# Patient Record
Sex: Female | Born: 1955 | Race: White | Hispanic: No | Smoking: Current every day smoker
Health system: Southern US, Community
[De-identification: ages and names within clinical notes are randomized; demographics above are authoritative.]

## PROBLEM LIST (undated history)

## (undated) DIAGNOSIS — E039 Hypothyroidism, unspecified: Secondary | ICD-10-CM

## (undated) DIAGNOSIS — R0600 Dyspnea, unspecified: Secondary | ICD-10-CM

## (undated) DIAGNOSIS — C801 Malignant (primary) neoplasm, unspecified: Secondary | ICD-10-CM

## (undated) DIAGNOSIS — F419 Anxiety disorder, unspecified: Secondary | ICD-10-CM

## (undated) DIAGNOSIS — I6529 Occlusion and stenosis of unspecified carotid artery: Secondary | ICD-10-CM

## (undated) DIAGNOSIS — R600 Localized edema: Secondary | ICD-10-CM

## (undated) DIAGNOSIS — I10 Essential (primary) hypertension: Secondary | ICD-10-CM

## (undated) DIAGNOSIS — J449 Chronic obstructive pulmonary disease, unspecified: Secondary | ICD-10-CM

## (undated) DIAGNOSIS — R519 Headache, unspecified: Secondary | ICD-10-CM

## (undated) DIAGNOSIS — K219 Gastro-esophageal reflux disease without esophagitis: Secondary | ICD-10-CM

## (undated) DIAGNOSIS — R51 Headache: Secondary | ICD-10-CM

## (undated) DIAGNOSIS — R011 Cardiac murmur, unspecified: Secondary | ICD-10-CM

## (undated) DIAGNOSIS — D649 Anemia, unspecified: Secondary | ICD-10-CM

## (undated) DIAGNOSIS — E079 Disorder of thyroid, unspecified: Secondary | ICD-10-CM

## (undated) DIAGNOSIS — M1990 Unspecified osteoarthritis, unspecified site: Secondary | ICD-10-CM

## (undated) DIAGNOSIS — F329 Major depressive disorder, single episode, unspecified: Secondary | ICD-10-CM

## (undated) DIAGNOSIS — I2510 Atherosclerotic heart disease of native coronary artery without angina pectoris: Secondary | ICD-10-CM

## (undated) DIAGNOSIS — Z972 Presence of dental prosthetic device (complete) (partial): Secondary | ICD-10-CM

## (undated) DIAGNOSIS — F32A Depression, unspecified: Secondary | ICD-10-CM

## (undated) HISTORY — PX: EYE SURGERY: SHX253

## (undated) HISTORY — PX: CARDIAC CATHETERIZATION: SHX172

## (undated) HISTORY — PX: JOINT REPLACEMENT: SHX530

## (undated) HISTORY — PX: TOTAL THYROIDECTOMY: SHX2547

## (undated) HISTORY — PX: TUBAL LIGATION: SHX77

---
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
## 2007-01-10 ENCOUNTER — Emergency Department: Payer: Self-pay | Admitting: Emergency Medicine

## 2007-03-29 ENCOUNTER — Emergency Department: Payer: Self-pay | Admitting: Emergency Medicine

## 2008-02-10 ENCOUNTER — Other Ambulatory Visit: Payer: Self-pay

## 2008-02-10 ENCOUNTER — Observation Stay: Payer: Self-pay | Admitting: Internal Medicine

## 2008-08-27 ENCOUNTER — Emergency Department: Payer: Self-pay | Admitting: Emergency Medicine

## 2008-09-01 ENCOUNTER — Emergency Department: Payer: Self-pay | Admitting: Emergency Medicine

## 2008-11-19 ENCOUNTER — Inpatient Hospital Stay: Payer: Self-pay | Admitting: Internal Medicine

## 2009-07-27 ENCOUNTER — Inpatient Hospital Stay: Payer: Self-pay | Admitting: Internal Medicine

## 2009-11-02 DIAGNOSIS — G629 Polyneuropathy, unspecified: Secondary | ICD-10-CM | POA: Insufficient documentation

## 2009-11-30 DIAGNOSIS — E538 Deficiency of other specified B group vitamins: Secondary | ICD-10-CM | POA: Insufficient documentation

## 2009-11-30 DIAGNOSIS — E209 Hypoparathyroidism, unspecified: Secondary | ICD-10-CM | POA: Insufficient documentation

## 2009-12-17 DIAGNOSIS — G4700 Insomnia, unspecified: Secondary | ICD-10-CM | POA: Insufficient documentation

## 2010-06-28 DIAGNOSIS — S20219A Contusion of unspecified front wall of thorax, initial encounter: Secondary | ICD-10-CM | POA: Insufficient documentation

## 2010-07-21 ENCOUNTER — Ambulatory Visit: Payer: Self-pay | Admitting: Family Medicine

## 2010-10-04 DIAGNOSIS — M549 Dorsalgia, unspecified: Secondary | ICD-10-CM | POA: Insufficient documentation

## 2010-11-18 ENCOUNTER — Ambulatory Visit: Payer: Self-pay

## 2011-01-25 ENCOUNTER — Encounter: Payer: Self-pay | Admitting: Physician Assistant

## 2011-01-30 ENCOUNTER — Encounter: Payer: Self-pay | Admitting: Physician Assistant

## 2011-02-15 ENCOUNTER — Ambulatory Visit: Payer: Self-pay | Admitting: Internal Medicine

## 2011-03-01 ENCOUNTER — Encounter: Payer: Self-pay | Admitting: Physician Assistant

## 2011-07-19 ENCOUNTER — Ambulatory Visit: Payer: Self-pay | Admitting: Gastroenterology

## 2011-10-12 ENCOUNTER — Ambulatory Visit: Payer: Self-pay | Admitting: Gastroenterology

## 2012-01-18 ENCOUNTER — Ambulatory Visit: Payer: Self-pay | Admitting: Family Medicine

## 2012-02-24 DIAGNOSIS — R42 Dizziness and giddiness: Secondary | ICD-10-CM | POA: Insufficient documentation

## 2012-06-08 DIAGNOSIS — N3941 Urge incontinence: Secondary | ICD-10-CM | POA: Insufficient documentation

## 2012-07-30 ENCOUNTER — Other Ambulatory Visit: Payer: Self-pay | Admitting: Gastroenterology

## 2012-07-30 DIAGNOSIS — K5730 Diverticulosis of large intestine without perforation or abscess without bleeding: Secondary | ICD-10-CM

## 2012-08-08 ENCOUNTER — Ambulatory Visit
Admission: RE | Admit: 2012-08-08 | Discharge: 2012-08-08 | Disposition: A | Discharge status: Home / Self Care | Payer: PRIVATE HEALTH INSURANCE | Source: Ambulatory Visit | Attending: Gastroenterology | Admitting: Gastroenterology

## 2012-08-08 DIAGNOSIS — K5730 Diverticulosis of large intestine without perforation or abscess without bleeding: Secondary | ICD-10-CM

## 2012-08-13 ENCOUNTER — Emergency Department: Payer: Self-pay | Admitting: Emergency Medicine

## 2012-08-15 ENCOUNTER — Emergency Department: Payer: Self-pay | Admitting: Emergency Medicine

## 2012-12-12 ENCOUNTER — Ambulatory Visit: Payer: Self-pay | Admitting: Internal Medicine

## 2013-04-19 DIAGNOSIS — M47816 Spondylosis without myelopathy or radiculopathy, lumbar region: Secondary | ICD-10-CM | POA: Insufficient documentation

## 2013-04-19 DIAGNOSIS — G8929 Other chronic pain: Secondary | ICD-10-CM | POA: Insufficient documentation

## 2013-06-13 ENCOUNTER — Ambulatory Visit: Payer: Self-pay | Admitting: Family Medicine

## 2013-09-22 ENCOUNTER — Inpatient Hospital Stay: Payer: Self-pay | Admitting: Psychiatry

## 2013-09-22 LAB — URINALYSIS, COMPLETE
Bilirubin,UR: NEGATIVE
Blood: NEGATIVE
Ketone: NEGATIVE
Nitrite: NEGATIVE
Protein: NEGATIVE
Specific Gravity: 1.018 (ref 1.003–1.030)
Squamous Epithelial: 2
WBC UR: 2 /HPF (ref 0–5)

## 2013-09-22 LAB — COMPREHENSIVE METABOLIC PANEL
Albumin: 3.5 g/dL (ref 3.4–5.0)
Bilirubin,Total: 0.2 mg/dL (ref 0.2–1.0)
EGFR (African American): >60
Glucose: 86 mg/dL (ref 65–99)
Osmolality: 276 (ref 275–301)
SGOT(AST): 35 U/L (ref 15–37)
SGPT (ALT): 24 U/L (ref 12–78)
Sodium: 139 mmol/L (ref 136–145)
Total Protein: 7.5 g/dL (ref 6.4–8.2)

## 2013-09-22 LAB — DRUG SCREEN, URINE
Amphetamines, Ur Screen: NEGATIVE (ref ?–1000)
Barbiturates, Ur Screen: NEGATIVE (ref ?–200)
Benzodiazepine, Ur Scrn: POSITIVE (ref ?–200)
MDMA (Ecstasy)Ur Screen: NEGATIVE (ref ?–500)
Phencyclidine (PCP) Ur S: NEGATIVE (ref ?–25)
Tricyclic, Ur Screen: NEGATIVE (ref ?–1000)

## 2013-09-22 LAB — CBC
HCT: 36.8 % (ref 35.0–47.0)
MCV: 94 fL (ref 80–100)
Platelet: 237 10*3/uL (ref 150–440)
WBC: 9.9 10*3/uL (ref 3.6–11.0)

## 2013-09-22 LAB — ETHANOL: Ethanol: <3 mg/dL

## 2014-03-04 DIAGNOSIS — K219 Gastro-esophageal reflux disease without esophagitis: Secondary | ICD-10-CM | POA: Insufficient documentation

## 2014-03-21 DIAGNOSIS — J209 Acute bronchitis, unspecified: Secondary | ICD-10-CM | POA: Insufficient documentation

## 2014-05-06 DIAGNOSIS — J449 Chronic obstructive pulmonary disease, unspecified: Secondary | ICD-10-CM | POA: Insufficient documentation

## 2014-05-06 DIAGNOSIS — E782 Mixed hyperlipidemia: Secondary | ICD-10-CM | POA: Insufficient documentation

## 2014-10-06 ENCOUNTER — Inpatient Hospital Stay: Payer: Self-pay | Admitting: Internal Medicine

## 2014-10-06 LAB — TROPONIN I

## 2014-10-06 LAB — CBC
HCT: 36.7 % (ref 35.0–47.0)
HGB: 11.7 g/dL — ABNORMAL LOW (ref 12.0–16.0)
MCH: 30.9 pg (ref 26.0–34.0)
MCHC: 31.8 g/dL — ABNORMAL LOW (ref 32.0–36.0)
MCV: 97 fL (ref 80–100)
Platelet: 250 10*3/uL (ref 150–440)
RBC: 3.78 10*6/uL — ABNORMAL LOW (ref 3.80–5.20)
RDW: 15.9 % — ABNORMAL HIGH (ref 11.5–14.5)
WBC: 11.4 10*3/uL — AB (ref 3.6–11.0)

## 2014-10-06 LAB — BASIC METABOLIC PANEL
Anion Gap: 9 (ref 7–16)
BUN: 5 mg/dL — AB (ref 7–18)
CALCIUM: 6.3 mg/dL — AB (ref 8.5–10.1)
CHLORIDE: 106 mmol/L (ref 98–107)
Co2: 27 mmol/L (ref 21–32)
Creatinine: 0.72 mg/dL (ref 0.60–1.30)
GLUCOSE: 81 mg/dL (ref 65–99)
OSMOLALITY: 279 (ref 275–301)
Potassium: 3.7 mmol/L (ref 3.5–5.1)
Sodium: 142 mmol/L (ref 136–145)

## 2014-10-06 LAB — CALCIUM: CALCIUM: 6.3 mg/dL — AB (ref 8.5–10.1)

## 2014-10-06 LAB — MAGNESIUM: MAGNESIUM: 2 mg/dL

## 2014-10-06 LAB — ALBUMIN: ALBUMIN: 3.2 g/dL — AB (ref 3.4–5.0)

## 2014-10-06 LAB — TSH: Thyroid Stimulating Horm: 41.9 u[IU]/mL — ABNORMAL HIGH

## 2014-10-07 LAB — CBC WITH DIFFERENTIAL/PLATELET
BASOS ABS: 0 10*3/uL (ref 0.0–0.1)
Basophil %: 0.3 %
EOS PCT: 0.1 %
Eosinophil #: 0 10*3/uL (ref 0.0–0.7)
HCT: 37 % (ref 35.0–47.0)
HGB: 11.8 g/dL — ABNORMAL LOW (ref 12.0–16.0)
Lymphocyte #: 1.5 10*3/uL (ref 1.0–3.6)
Lymphocyte %: 13.2 %
MCH: 30.9 pg (ref 26.0–34.0)
MCHC: 31.8 g/dL — AB (ref 32.0–36.0)
MCV: 97 fL (ref 80–100)
Monocyte #: 0.5 x10 3/mm (ref 0.2–0.9)
Monocyte %: 4.4 %
NEUTROS ABS: 9.5 10*3/uL — AB (ref 1.4–6.5)
NEUTROS PCT: 82 %
PLATELETS: 269 10*3/uL (ref 150–440)
RBC: 3.8 10*6/uL (ref 3.80–5.20)
RDW: 15 % — AB (ref 11.5–14.5)
WBC: 11.5 10*3/uL — AB (ref 3.6–11.0)

## 2014-10-07 LAB — BASIC METABOLIC PANEL
Anion Gap: 5 — ABNORMAL LOW (ref 7–16)
BUN: 8 mg/dL (ref 7–18)
CALCIUM: 6.8 mg/dL — AB (ref 8.5–10.1)
CO2: 31 mmol/L (ref 21–32)
Chloride: 102 mmol/L (ref 98–107)
Creatinine: 0.9 mg/dL (ref 0.60–1.30)
EGFR (African American): >60
EGFR (Non-African Amer.): >60
GLUCOSE: 140 mg/dL — AB (ref 65–99)
Osmolality: 276 (ref 275–301)
Potassium: 3.6 mmol/L (ref 3.5–5.1)
SODIUM: 138 mmol/L (ref 136–145)

## 2014-10-07 LAB — CALCIUM: Calcium, Total: 7 mg/dL — CL (ref 8.5–10.1)

## 2014-10-08 LAB — BASIC METABOLIC PANEL
ANION GAP: 5 — AB (ref 7–16)
BUN: 10 mg/dL (ref 7–18)
CO2: 34 mmol/L — AB (ref 21–32)
CREATININE: 1.01 mg/dL (ref 0.60–1.30)
Calcium, Total: 6.5 mg/dL — CL (ref 8.5–10.1)
Chloride: 101 mmol/L (ref 98–107)
EGFR (African American): >60
GFR CALC NON AF AMER: 60 — AB
Glucose: 115 mg/dL — ABNORMAL HIGH (ref 65–99)
OSMOLALITY: 279 (ref 275–301)
POTASSIUM: 3.5 mmol/L (ref 3.5–5.1)
Sodium: 140 mmol/L (ref 136–145)

## 2014-10-17 DIAGNOSIS — F3341 Major depressive disorder, recurrent, in partial remission: Secondary | ICD-10-CM | POA: Insufficient documentation

## 2015-02-02 ENCOUNTER — Ambulatory Visit
Admit: 2015-02-02 | Disposition: A | Discharge status: Home / Self Care | Payer: Self-pay | Attending: Family Medicine | Admitting: Family Medicine

## 2015-02-20 NOTE — H&P (Signed)
PATIENT NAME:  Maria Warren, Maria Warren MR#:  [id number] DATE OF BIRTH:  [birth year]  DATE OF ADMISSION:  09/22/2013  IDENTIFYING INFORMATION AND CHIEF COMPLAINT: A 59 year old woman who came into the hospital saying that she was having suicidal ideation. Chief complaint: "I've just been feeling so bad."   HISTORY OF PRESENT ILLNESS: The patient states that her mood has been severely depressed for at least the last 2 weeks. She is sleeping poorly at night. She has negative thoughts all the time. Feels lonely and sad about herself. Has poor levels of optimism. She has suicidal thoughts which are vague, and it is unclear to me how serious she is about them. She told me at one point that she would only kill herself if she could do it without hurting herself and at another point said her only plan was to beat herself on the head with a hammer. She would not reconcile any of this for me. She says that she felt like when she started taking Topamax at the pain clinic that it made her depression worse. She has been off the Topamax for about a week and says the mood has been getting worse since then. She does not report auditory or visual hallucinations. Does not report alcohol or drug abuse. Says she has been compliant with her medication.   PAST PSYCHIATRIC HISTORY: Denies any previous psychiatric hospitalizations. Denies any previous suicide attempts. She has been getting psychiatric medication from her primary care doctor at the [facility]. She has a past history of medications using Zoloft and Wellbutrin which she says did not work. She is currently taking citalopram 20 mg twice a day and BuSpar 10 mg twice a day as far as I can tell. She says she thinks that they were working in the past. Denies any history of violence to others.   MEDICAL HISTORY: The patient has hypothyroidism. Appears to have pretty significant hypocalcemia. Also has chronic pain and chronic back pain and elevated cholesterol and gastric  reflux symptoms.   SOCIAL HISTORY: The patient is not married. She has an adult child who lives somewhere else and is not involved in her life. It sounds like she rents out part of her property to other people, and there has been some drama around that that has been stressing her out recently.   SUBSTANCE ABUSE HISTORY: Drinks about 3 beers a week she says. Also used marijuana last night. Denies that she uses it regularly.   TOBACCO HISTORY: Smokes a pack a day.   REVIEW OF SYSTEMS: Complains of depression, fatigue, poor appetite, impaired sleep. Suicidal ideation. Denies hallucinations. Chronic back pain, chronic gastric reflux symptoms.   MENTAL STATUS EXAM: Older woman who looks older than her stated age. Reasonably cooperative with the interview, although sometimes vague in answering questions and a little bit evasive, I think. Eye contact poor. Psychomotor activity limited. Speech decreased in total amount. Affect anxious and dysphoric. Mood stated as depressed. Thoughts are generally lucid. No obvious delusional thinking. Denies hallucinations. Positive suicidal ideation with unclear intent. No homicidal ideation. Probably average intelligence. Alert and oriented x 4. Judgment and insight a little bit impaired.   PHYSICAL EXAMINATION:  GENERAL: Older woman who is overweight.  SKIN: No acute skin lesions identified.  HEENT: Pupils equal and reactive. Face symmetric. Oral mucosa dry.  NECK AND BACK: Mild tenderness throughout to light touch.  NEUROLOGIC: Normal gait. Strength and reflexes symmetric and normal throughout. Full range of motion at extremities. Cranial nerves symmetric and  normal.  LUNGS: Clear without wheezes.  HEART: Regular rate and rhythm.  ABDOMEN: Soft, nontender. Normal bowel sounds.  VITAL SIGNS: Current temperature 98.2, pulse 86, respirations 18, blood pressure 138/75.   LABORATORY RESULTS: Chemistry panel all normal except for a low calcium at 8. Alcohol negative.  Drug screen positive for cannabis and benzodiazepines. CBC normal. Urinalysis 1+ bacteria but otherwise does not really look infected. Salicylates slightly elevated at 2.9. Acetaminophen negative.   ASSESSMENT: A 59 year old woman who gives a history of long-standing depression. She is giving a history of current suicidal ideation without specific intent but is not able to contract for safety. Needs hospitalization for stabilization.   TREATMENT PLAN: Admit to psychiatry. Continue current medications. Try and get collateral information. Include her in groups and activities and individual therapy on the unit. Consider possible changes to medication.   DIAGNOSIS, PRINCIPAL AND PRIMARY:  AXIS I: Major depression, severe, recurrent.   SECONDARY DIAGNOSES:  AXIS I: No further.  AXIS II: No diagnosis.  AXIS III: Hypocalcemia, hypothyroidism, dyslipidemia, gastric reflux, chronic pain.  AXIS IV: Moderate to severe chronically and acutely.  AXIS V: Functioning at time of evaluation is 34.   ____________________________ Gonzella Lex, MD jtc:gb D: 09/22/2013 16:44:16 ET T: 09/22/2013 22:05:12 ET JOB#: [id number]  cc: Gonzella Lex, MD, <Dictator> Gonzella Lex MD ELECTRONICALLY SIGNED 09/22/2013 22:37

## 2015-02-20 NOTE — Discharge Summary (Signed)
PATIENT NAME:  Maria Warren, Maria Warren MR#:  [id number] DATE OF BIRTH:  1956-01-05  DATE OF ADMISSION:  09/22/2013 DATE OF DISCHARGE:  09/24/2013  HOSPITAL COURSE: See dictated history and physical for details of admission. This 59 year old woman came in through the Emergency Room, stating she was severely depressed and having suicidal ideation. The patient was engaged in individual and group psychotherapy as well as medication management on the ward. Her pain was treated, as well as her chronic obstructive pulmonary disease. She was continued on her outpatient medicines for her medical problems. Her citalopram was prescribed at 40 mg a day, BuSpar 10 mg twice a day. After just a day in the hospital, the patient reported her mood was feeling much better. Her affect was brighter. She was calmer and smiling. Denied any suicidal ideation and said she felt like she had had time to recover and no longer felt overwhelmed by her outpatient stresses. She was agreeable to outpatient treatment in the community. Outpatient appointment was made with [facility] with Dr. Leim Fabry for her follow-up.   DISCHARGE MEDICATIONS: Trazodone 100 mg at bedtime, citalopram 40 mg daily, simvastatin 20 mg at night, hydrochlorothiazide/lisinopril combination 12.5/10 mg 1 tablet twice a day, BuSpar 10 mg twice a day, albuterol/ipratropium inhaler 1 puff 4 times a day as needed for shortness of breath, fluticasone 250 mcg inhalation powder 1 puff twice a day, Spiriva HandiHaler 18 mcg inhaled per day, MiraLax 17 grams and a day as needed for constipation, Premarin 0.625 mg once a day, levothyroxine 175 mcg per day, calcitriol 0.5 mg twice a day.   LABORATORY RESULTS: Admission labs included alcohol level negative. Chemistry panel with low calcium at 8.0. Drug screen positive for cannabis and benzodiazepines. Hematology panel normal. Urinalysis borderline, unlikely contaminated.   MENTAL STATUS EXAM AT DISCHARGE: Casually dressed, reasonably  well-groomed woman, looks her stated age, cooperative with the interview. Good eye contact. Normal psychomotor activity. Speech normal rate, tone and volume. Affect euthymic, reactive. Mood stated as good. Thoughts are lucid without loosening of associations or delusions. Denies auditory or visual hallucinations. Denies suicidal or homicidal ideation. Shows good insight and judgment. Normal intelligence. Alert and oriented x4.   DIAGNOSIS, PRINCIPAL AND PRIMARY:  AXIS I: Major depression, recurrent, moderate.   SECONDARY DIAGNOSES: AXIS I: No further.   AXIS II: Deferred.   AXIS III: Hypothyroidism, chronic obstructive pulmonary disease, chronic, low calcium, high blood pressure, dyslipidemia.   AXIS IV: Moderate from isolation.   AXIS V: Functioning at time of discharge 59.      ____________________________ Gonzella Lex, MD jtc:cg D: 09/24/2013 22:01:56 ET T: 09/24/2013 22:08:44 ET JOB#: [id number]  cc: Gonzella Lex, MD, <Dictator> Gonzella Lex MD ELECTRONICALLY SIGNED 09/24/2013 23:45

## 2015-02-21 NOTE — Consult Note (Signed)
PATIENT NAME:  Maria Warren, Maria Warren MR#:  [id number] DATE OF BIRTH:  05/14/1956  DATE OF CONSULTATION:  10/07/2014  CONSULTING PHYSICIAN:  Gonzella Lex, MD  IDENTIFYING INFORMATION AND REASON FOR CONSULTATION: A 59 year old woman with a history of a depression, currently in the hospital with symptomatic hypocalcemia.   CHIEF COMPLAINT: "I've been doing without my medicine."   HISTORY OF PRESENT ILLNESS: The patient states her nerves have been bad and her mood has been bad. She has been feeling anxious and jittery, and depressed. Mood stays sad at times and irritable and angry. She has been depressed and sleeping a lot. She feels like there has been a huge amount of drama around her at home. She has a bad habit of inviting people she barely knows to come and live with her and then they end up causing trouble. The police have had to be called recently. She is unable to financially take care of herself. She has been skipping her medicine for the last couple months because she could not afford it. She has been off of her thyroid medicine as well as the Celexa, BuSpar, and hydroxyzine, and her blood pressure medicine. Denies suicidal thoughts. Denies psychotic symptoms. Also has had very occasional panic attacks, most recently one that woke her up at night.   PAST PSYCHIATRIC HISTORY: The patient has had a previous hospitalization at our facility. Has had suicidal ideation in the past. Recently, no suicide attempts. She was getting her medicine prescribed by her primary care at [facility]. Has not seen an actual mental health provider in a long time.   SUBSTANCE ABUSE HISTORY: Says that she has been drinking beer more frequently recently, about 2 of them a night. She says there is a past history of alcohol abuse, but she disputes that. Does not abuse any other drugs.   PAST MEDICAL HISTORY: Hypothyroidism, high blood pressure, symptomatic hypocalcemia, overweight.   SOCIAL HISTORY: Lives with some  other people she barely knows that she has invited to come and stay in her house. Minimal social contact outside of that.   FAMILY HISTORY: Mother had bipolar disorder.   REVIEW OF SYSTEMS:  Sad mood, angry mood. Low energy. Increased sleep. Denies suicidal ideation. No hallucinations. Physically, she is still feeling like she has a tingling all over her body, but that is getting better.   MENTAL STATUS EXAMINATION: A somewhat disheveled woman who looks her stated age, cooperative with the interview. Eye contact diminished.  Psychomotor activity sluggish. Speech decreased in amount and rate. Affect is flat. Mood is stated as depressed. Thoughts are lucid but slow. No evidence of delusions. Denies auditory or visual hallucinations. Denies suicidal or homicidal ideation. She is alert and oriented x 4. She can remember 3/3 objects immediately, only 1 out of 3 at 3 minutes. Baseline intelligence average. Judgment and insight adequate.   LABORATORY RESULTS: Admission labs included TSH at 41.9, albumin low at 3.2, magnesium 2, calcium is very low down at 6.3.  The CBC, elevated white count, 11.4.   VITAL SIGNS: Most recent blood pressure 146/83, respirations 18, pulse 56, temperature 98.6.   ASSESSMENT: A 59 year old woman with a history of depression, has been off her medicine for a couple of months. Some of her symptoms could be from hypothyroidism. Also has a history of depression and anxiety. The patient is not reporting suicidal ideation or psychosis, does not require psychiatric hospitalization.   TREATMENT PLAN: Restart Celexa 40 mg daily, BuSpar 10 mg twice a  day, hydroxyzine 25 mg 4 times a day as needed for anxiety. The patient is strongly encouraged to consider followup with a mental health provider if her mood does not returned to normal, but to stay on her medicine. She has been educated about the utility of the medicine. She is also educated and encouraged to stay off of alcohol when she is  feeling bad like this. Will check in tomorrow if she is still here.   DIAGNOSIS, PRINCIPAL AND PRIMARY:  AXIS I: Major depression, recurrent, moderate.   SECONDARY DIAGNOSES: AXIS I: Generalized anxiety disorder.  AXIS II: Deferred.  AXIS III: Hypothyroid, hypocalcemia.     ____________________________ Gonzella Lex, MD jtc:LT D: 10/07/2014 16:24:31 ET T: 10/07/2014 16:39:45 ET JOB#: [id number]  cc: Gonzella Lex, MD, <Dictator> Gonzella Lex MD ELECTRONICALLY SIGNED 10/12/2014 11:15

## 2015-02-21 NOTE — Consult Note (Signed)
PATIENT NAME:  Maria Warren, Maria Warren MR#:  [id number] DATE OF BIRTH:  Nov 14, 1955  DATE OF CONSULTATION:  10/07/2014  REFERRING PHYSICIAN:  Bettey Costa, M.D.  CONSULTING PHYSICIAN:  A. Lavone Orn, MD  CHIEF COMPLAINT: Hypothyroidism and hypoparathyroidism.   HISTORY OF PRESENT ILLNESS: This is a 59 year old female seen in consultation for the above complaints. She underwent total thyroidectomy in 1657 which was complicated by postoperative hypoparathyroidism. She explains the need for thyroidectomy was prompted by a large symptomatic thyroid nodule causing compressive symptoms. She recalls that the thyroid was benign. She has been treated for hypoparathyroidism and hypothyroidism ever since. Upon presentation on yesterday, she had been without all medications for over a month. Initial serum calcium was low at 6.3, corrected for hypoalbuminemia to 6.9. She was having facial paresthesias and numbness. She was initiated on IV calcium, calcitriol, and Tums. Additionally, TSH was elevated at 41.9 consistent with uncontrolled postoperative hypothyroidism. She does report weakness. Reason for not taking medication was difficulty with finances as well as difficulty with transportation. With supplementation, calcium levels have gradually improved and most recent level around 2:00 p.m. today was corrected to 7.6 mg/dL. In reviewing her bag of medications at the bedside, she was previously prescribed levothyroxine 175 mcg and calcitriol 0.25 mcg 3 tablets twice daily. She was not prescribed, nor was she taking, any calcium supplementation.   PAST MEDICAL HISTORY:  1.  Postoperative hypothyroidism.  2.  Postoperative hypoparathyroidism.  3.  Hyperlipidemia.  4.  COPD.   5.  Hypertension.  6.  Anxiety.  7.  GERD.    PAST SURGICAL HISTORY:  1.  Total thyroidectomy.  2.  Cesarean section.   SOCIAL HISTORY: The patient is a smoker, smoking 1 pack per day. Denies alcohol use.   FAMILY HISTORY: No known endocrine  disorders.   ALLERGIES: WELLBUTRIN, TOPAMAX, LYRICA, AND ZOLOFT.   CURRENT MEDICATIONS:  1. Calcitriol 0.25 mcg once daily.  2. Tums 1000 mg q.i.d.  3. Atorvastatin 40 mg at bedtime.   4. BuSpar 10 mg b.i.d.  5. Vitamin D 1000 units daily.  6. Celexa 40 mg daily.  7. Levothyroxine 0.175 mg daily.  8. Pantoprazole 40 mg daily.  9. Aspirin 81 mg daily.  10. Advair 250/50 one puff b.i.d.  11. Spiriva 1 capsule once daily.    REVIEW OF SYSTEMS:   GENERAL: She reports weakness. She reports fatigue. She denies fevers.  HEENT: Denies blurred vision or sore throat.  NECK: Denies neck pain or dysphagia.  CARDIAC: Denies chest pain or palpitation.  PULMONARY: Denies cough or shortness of breath.  ABDOMEN: Denies nausea or abdominal pain. Reports fair appetite.  EXTREMITIES: Denies lower extremity swelling.  NEUROLOGIC: Denies paresthesias in the face or elsewhere. Denies recent falls.  ENDOCRINE: No heat or cold intolerance.  GENITOURINARY: Denies dysuria and hematuria.   PHYSICAL EXAMINATION:  VITAL SIGNS: Height 65.9 inches, weight 102.1 kg, BMI 36. Temperature 98.6, pulse 56, respiration 18, blood pressure 146/83, pulse oximetry 93% on room air.  GENERAL: This is a chronically ill-appearing white female in no acute distress, disheveled. HEENT: No proptosis, lid lag, or stare.  OROPHARYNX: Clear. Mucous membranes moist.  NECK: Linear scar of her lower neck, well-healed. No palpable neck mass. Neck is supple. CARDIAC: Regular rate and rhythm. No carotid bruit.  LUNGS: Clear to auscultation bilaterally. No wheeze.  ABDOMEN: Diffusely soft, nontender, nondistended.  EXTREMITIES: No peripheral edema is present.  NEUROLOGIC: Negative Chvostek sign. No tremor. No dysarthria.  SKIN: No rash or other  noted skin changes.   LABORATORY DATA: Glucose 140, BUN 8, creatinine 0.9, sodium 138, potassium 3.6, CO2 31, EGFR greater than 60. Magnesium 2.0. TSH 41.9. Calcium and albumin levels per HPI.  Hematocrit 37, WBC 11.5, platelets 269,000. 125-hydroxy vitamin D 24.3, 25-hydroxy vitamin D is pending.   ASSESSMENT:  1.  Postoperative hypothyroidism, uncontrolled.  2.  Postoperative hypoparathyroidism, uncontrolled.  3.  Hypocalcemia due to hypoparathyroidism.    4.  Medication noncompliance.   PLAN:   1.  Recommend increasing her calcitriol to 1 mcg once daily. This can be taken in the morning. 2.  Change dose of Tums to 1000 mg twice daily. It will be more convenient. She was advised to take this at mealtime.  3.  No need for vitamin D supplementation or magnesium supplementation at this time. 4.  Advised of the chronic and severe nature of her medical condition and the importance of compliance with medication was stressed.  5.  Continue with levothyroxine 175 mcg daily.  6.  No need for ongoing inpatient hospitalization from endocrine standpoint. I will arrange for outpatient clinic followup in 1 week.   Thank you for the kind request for consultation.    ____________________________ A. Lavone Orn, MD ams:bu D: 10/07/2014 18:07:41 ET T: 10/07/2014 19:17:54 ET JOB#: [id number]  cc: A. Lavone Orn, MD, <Dictator> Honor Loh. Loma Newton, MD Sherlon Handing MD ELECTRONICALLY SIGNED 10/08/2014 17:40

## 2015-02-21 NOTE — Consult Note (Signed)
Brief Consult Note: Diagnosis: major depression moderate recurrent.   Patient was seen by consultant.   Consult note dictated.   Orders entered.   Comments: Psychiatry: Patient seen and chart reviewed and note dictated. Already restarted celexa and buspar. I restarted her prn hydroxyzine. Counceling done. Will follow as needed.  Electronic Signatures: Clapacs, Madie Reno (MD)  (Signed 08-Dec-15 13:07)  Authored: Brief Consult Note   Last Updated: 08-Dec-15 13:07 by Gonzella Lex (MD)

## 2015-02-21 NOTE — H&P (Signed)
PATIENT NAME:  Maria Warren, Maria Warren MR#:  [id number] DATE OF BIRTH:  April 04, 1956  REFERRING EMERGENCY ROOM PHYSICIAN:   Elta Guadeloupe R. Jacqualine Code, MD   PRIMARY CARE PHYSICIAN: Dr. Jabier Mutton at [facility].   CHIEF COMPLAINT: Numbness and tingling over the entire body.   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 She reports that she has been slightly short of breath for the past 3-4 days with cough productive of  brown sputum. She has had decreased exercise tolerance. She has had subjective chills and sweats, but no measured fever. No nausea, vomiting, diarrhea. Today when she woke up she felt cold, numb, and tingling all over. She felt that she was unable to walk across the room. She called EMS and presented to the Emergency Room. On presentation, her calcium is found to be 6.9, corrected, and she is being admitted for hypocalcemia.   PAST MEDICAL HISTORY: 1.  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 2.  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 3.  Hyperlipidemia.  4.  COPD.  5.  Hypertension. 6.  Anxiety.  7.  Gastroesophageal reflux disease.   PAST SURGICAL HISTORY: 1.  C-section x 2.  2.  Thyroidectomy for benign thyroid mass.   SOCIAL HISTORY: The patient lives alone, but has several roommates. She smokes greater than 1 pack of cigarettes per day for the past 40 years. She does not drink alcohol or use any illicit substances. She is disabled due to back pain, depression, and anxiety.   FAMILY MEDICAL HISTORY: Positive for coronary artery disease in her mother and father. One aunt had breast cancer. Her mother and grandmother both had CVAs. No other family members with calcium disorders.   ALLERGIES: WELLBUTRIN, TOPAMAX, LYRICA, ZOLOFT.   HOME MEDICATIONS: Note that  the patient has not taken any home medications for at least 1 month due to lack of access to transportation. These are her prescribed medications: 1.  Vitamin D3, 1000 units 1 tablet once a day.  2.  Vitamin B12, 1000 mcg 1 tablet once a day.  3.  Tizanidine 4 mg 1 tablet every 8 hours as needed for muscle spasm.  4.  Spiriva 18 mcg inhalation 1 capsule inhaled daily.  5.  ProAir HFA CFC free 90 mcg inhaler 2 puffs inhaled every 4 hours as needed for shortness of breath.  6.  Polyethylene glycol 17 grams once a day. 7.  Omeprazole 20 mg 1 capsule once a day.  8.  Levothyroxine 175 mcg 1 tablet daily.  9.  Isosorbide mononitrate 30 mg 1 tablet once a day.  10.  Hydroxyzine 25 mg 1 tablet 4 times a day as needed for anxiety.  11.  Hydrochlorothiazide-lisinopril 12.5 mg-10 mg 1 tablet once a day.  12.  Citalopram 40 mg 1 tablet once a day.  13.  Calcitriol 0.25 mcg 3 capsules orally 2 times a day.  14.  Buspirone 10 mg 1 tablet twice a day.  15.  Atorvastatin 40 mg 1 tablet once a day. 16.  Aspirin 81 mg 1 tablet once a day. 17.  Advair Diskus 250 mcg-50 mcg 1 puff inhaled twice a day.   REVIEW OF SYSTEMS: CONSTITUTIONAL: Positive for weakness, negative for fatigue or weight change.  HEENT: Negative for change in the hearing or vision. No pain in the eyes or ears. No sore throat  or difficulty swallowing.  RESPIRATORY: Positive for cough, productive of brown sputum, positive for decreased exercise tolerance. No wheezing, no pleuritic-type chest pain.  CARDIOVASCULAR: No chest pain, no orthopnea, edema, palpitations, or syncope.  GASTROINTESTINAL: No nausea, vomiting, diarrhea, or abdominal pain.  GENITOURINARY: No dysuria or frequency.  MUSCULOSKELETAL: No joint effusions. No new pain in the neck, back, shoulders, knees, or hips. No limited activity.  NEUROLOGIC: Does have diffuse tingling and sensation of numbness, subjective weakness. No confusion, focal numbness, or weakness. No dysarthria,  dementia, seizure, or headache.  PSYCHIATRIC: Does report that she is very anxious, feels "on edge."  Denies suicidal ideation.   PHYSICAL EXAMINATION: VITAL SIGNS: Temperature 98.5, pulse 81, respirations 18, blood pressure is 103/82, oxygenation 95% on room air.  GENERAL: No acute distress.  HEENT: Pupils are equal, round, and reactive to light. Conjunctivae are clear. No icterus or injection. Mucous membranes are dry. Posterior oropharynx is clear with no exudate, edema, or erythema. She has upper dentures. No lower teeth. Trachea is midline.  NECK:  No cervical lymphadenopathy. Thyroid is surgically absent.  PULMONARY: Lungs are clear to auscultation bilaterally with good air movement.  CARDIOVASCULAR: Regular rate and rhythm, no murmurs, rubs, or gallops. No peripheral edema. Peripheral pulses are 2+.  ABDOMEN: Soft, nontender, nondistended. Bowel sounds are normal.  MUSCULOSKELETAL: No joint effusions. Range of motion is normal, strength is 5/5 throughout.  NEUROLOGIC: Cranial nerves II through XII are grossly intact. Sensation is intact though she has a subjective feeling of numbness. She is able to note touch and pinprick sensation. Strength and sensation are intact and equal bilaterally, nonfocal.  PSYCHIATRIC: The patient is alert and oriented x 4. She does exhibit some signs of depression with a flat affect, states that she is feeling on edge.  LABORATORY DATA: Sodium 142, potassium 3.7, chloride 106, bicarbonate 27, BUN 5, creatinine 0.72, glucose 81, calcium 6.3, magnesium 2.0, albumin 3.2, troponin less than 0.02. White blood cells 11.4, hemoglobin 11.7, platelets 250,000.  MCV 97.   IMAGING: Chest x-ray shows mild chronic interstitial changes. No acute abnormality is noted.   ASSESSMENT AND PLAN: 1.  Hypocalcemia: Likely due to hypoparathyroidism after surgical removal of the thyroid. We will check a PTH. Magnesium level is normal. We will check vitamin D. Check a TSH. She has  received calcium gluconate 2000 mg in the ED and she will start oral calcium repletion as well. We will start vitamin D. She has not taken any of her home medications for at least 1 month. Recheck EKG in am due to prolonged QT. 2.  Hypothyroidism: Check a TSH and restart levothyroxine.  3.  Anxiety: The patient reports that her depression and anxiety are uncontrolled at this time. We will consult psychiatry. Will resume selective serotonin reuptake inhibitor and buspirone.  4.  Chronic obstructive pulmonary disease: No signs of chronic obstructive pulmonary disease exacerbation at this time. Continue with home regimen including Spiriva, albuterol as needed, Advair twice a day.  5.  Gastroesophageal reflux disease: Continue with proton pump inhibitor.  6.  Hypertension: Patient is slightly hypotensive at this time. Will not resume antihypertensives. 7.  Hyperlipidemia: Restart statin.  8.  Prophylaxis: At this time the patient is ambulatory and not acutely ill.  We will not start pharmacological deep venous thrombosis prophylaxis, we will use sequential compression devices. She is on Protonix.   TIME SPENT ON ADMISSION: 45 minutes.    ____________________________ Earleen Newport. Volanda Napoleon, MD cpw:LT D: 10/06/2014 18:12:27 ET T: 10/06/2014 19:04:51  ET JOB#: [id number]  cc: Barnetta Chapel P. Volanda Napoleon, MD, <Dictator> Aldean Jewett MD ELECTRONICALLY SIGNED 10/07/2014 12:38

## 2015-02-21 NOTE — Discharge Summary (Signed)
PATIENT NAME:  Maria Warren, Maria Warren MR#:  [id number] DATE OF BIRTH:  [birth year]  ADMISSION DIAGNOSIS:  Symptomatic hypocalcemia.  DISCHARGE DIAGNOSES: 1.  Hypocalcemia.  2.  Hypothyroidism, uncontrolled.  CONSULTATIONS:  1.  A. Lavone Orn, MD 2.  Gonzella Lex, MD  LABORATORIES AT DISCHARGE:  Sodium 140, potassium 3.5, chloride 101, bicarbonate 34, BUN 10, creatinine 1.01, glucose 115, calcium was low at 6.5, which was repleted prior to discharge. PTH intact 0.6.   PHYSICAL EXAMINATION AT DISCHARGE: VITAL SIGNS: The patient's temperature was 97.5, pulse of 54, respirations 18, blood pressure 132/75, 97% on room air.  GENERAL: The patient is alert, oriented, not in acute distress.  CARDIOVASCULAR: Regular rate and rhythm. No murmurs, gallops, or rubs. PMI is not displaced.  LUNGS: Clear to auscultation without crackles, rales, rhonchi, or wheezing. Normal to percussion.  ABDOMEN: Obese. Bowel sounds are positive, nontender. Hard to appreciate organomegaly due to body habitus.  NEUROLOGIC:  Cranial nerves 2 through 12 are intact. The patient had no evidence of tetany.  HOSPITAL COURSE: This is a very pleasant 59 year old female who presents with numbness and tingling over her entire body, found to have hypocalcemia.  For further details, please refer to the H and P.   1.  Symptomatic hypocalcemia. The patient has a history of parathyroidectomy with thyroidectomy several years ago. She stopped taking some of her medications, which subsequently led her to be hypocalcemic and have symptoms. I discussed in great detail the detrimental effects of having ongoing hypocalcemia. The patient does acknowledge these effects. She was treated for her hypocalcemia. I actually also had Dr. Gabriel Carina see the patient in consultation. Dr. Gabriel Carina recommended to increase the TUMS to 1000 b.i.d. and calcitriol to 1 mcg daily.  She does not need vitamin D supplementation or magnesium supplementation at this time. The  patient's symptoms have now subsequently resolved.  2.  Severe hypothyroidism. Again, the patient was not taking her levothyroxine. TSH was about 40. She was started on Synthroid 175 mcg daily, which is her outpatient dose. She will need repeat TFTs in approximately 4-6 weeks.  3.  History of COPD with no signs or symptoms, which is stable.  4.  Depression. We appreciate Dr. Weber Cooks' consult. The patient was just recommended to continue her outpatient medications.  5.  Chest pain. The patient was complaining of chest pain. She has tenderness at the chest wall. This was secondary to her fall. There is no obvious rib fractures on chest x-ray.   DISCHARGE MEDICATIONS: 1.  Citalopram 40 mg daily. 2.  Buspirone 10 mg p.o. b.i.d.  3.  Polyethylene glycol 17 grams p.r.n.  4.  Hydroxyzine hydrochloride 25 mg 4 times a day as needed for anxiety.  5.  Omeprazole 20 mg daily.  6.  Atorvastatin 40 mg at bedtime.  7.  Imdur 30 mg daily.  8.  Tizanidine 4 mg q. 8 hours p.r.n. muscle spasm.  9.  Aspirin 81 mg daily.  10.  ProAir 2 puffs 4 times a day p.r.n.  11.  Advair Diskus 250/50 b.i.d.  12.  Hydrochlorothiazide and lisinopril 12.5/10 daily.  13.  Synthroid 175 mcg daily.  14.  Spiriva 18 mcg daily.  15.  Vitamin D3, 1000 International Units daily.  16.  Calcitriol 0.5 mcg 2 capsules daily.  17.  Tylenol with hydrocodone 325/5 q. 6 hours p.r.n. pain #15.  18.  Calcium carbonate 500 mg 2 tablets b.i.d.   DISCHARGE DIET: Regular diet.   DISCHARGE ACTIVITY: As tolerated.  DISCHARGE FOLLOWUP: The patient will follow up with Philis Pique in 1 week and Dr. Gabriel Carina in 2 weeks.   TIME SPENT: Approximately 45 minutes at discharge. The patient was stable for discharge.     ____________________________ Kasidi Shanker P. Benjie Karvonen, MD spm:LT D: 10/08/2014 13:37:00 ET T: 10/08/2014 18:01:35 ET JOB#: [id number]  cc: Kamsiyochukwu Spickler P. Benjie Karvonen, MD, <Dictator> A. Lavone Orn, MD [place] P Nakiesha Rumsey MD ELECTRONICALLY SIGNED 10/09/2014 14:00

## 2015-04-03 DIAGNOSIS — R634 Abnormal weight loss: Secondary | ICD-10-CM | POA: Insufficient documentation

## 2015-05-08 ENCOUNTER — Encounter: Payer: Self-pay | Admitting: Emergency Medicine

## 2015-05-08 ENCOUNTER — Emergency Department: Payer: Medicare Other

## 2015-05-08 ENCOUNTER — Emergency Department
Admission: EM | Admit: 2015-05-08 | Discharge: 2015-05-08 | Disposition: A | Discharge status: Home / Self Care | Payer: Medicare Other | Attending: Emergency Medicine | Admitting: Emergency Medicine

## 2015-05-08 ENCOUNTER — Other Ambulatory Visit: Payer: Self-pay

## 2015-05-08 DIAGNOSIS — I9589 Other hypotension: Secondary | ICD-10-CM | POA: Diagnosis not present

## 2015-05-08 DIAGNOSIS — R42 Dizziness and giddiness: Secondary | ICD-10-CM | POA: Diagnosis present

## 2015-05-08 DIAGNOSIS — I10 Essential (primary) hypertension: Secondary | ICD-10-CM | POA: Diagnosis not present

## 2015-05-08 DIAGNOSIS — Z79899 Other long term (current) drug therapy: Secondary | ICD-10-CM | POA: Insufficient documentation

## 2015-05-08 DIAGNOSIS — Z7982 Long term (current) use of aspirin: Secondary | ICD-10-CM | POA: Insufficient documentation

## 2015-05-08 DIAGNOSIS — R110 Nausea: Secondary | ICD-10-CM | POA: Insufficient documentation

## 2015-05-08 HISTORY — DX: Disorder of thyroid, unspecified: E07.9

## 2015-05-08 HISTORY — DX: Essential (primary) hypertension: I10

## 2015-05-08 LAB — COMPREHENSIVE METABOLIC PANEL
ALBUMIN: 3.4 g/dL — AB (ref 3.5–5.0)
ALT: 15 U/L (ref 14–54)
AST: 35 U/L (ref 15–41)
Alkaline Phosphatase: 54 U/L (ref 38–126)
Anion gap: 10 (ref 5–15)
BUN: 10 mg/dL (ref 6–20)
CALCIUM: 7.4 mg/dL — AB (ref 8.9–10.3)
CO2: 24 mmol/L (ref 22–32)
Chloride: 101 mmol/L (ref 101–111)
Creatinine, Ser: 1.02 mg/dL — ABNORMAL HIGH (ref 0.44–1.00)
GFR calc Af Amer: >60 mL/min (ref >60)
GFR calc non Af Amer: 59 mL/min — ABNORMAL LOW (ref >60)
Glucose, Bld: 153 mg/dL — ABNORMAL HIGH (ref 65–99)
Potassium: 3.8 mmol/L (ref 3.5–5.1)
Sodium: 135 mmol/L (ref 135–145)
Total Bilirubin: 0.7 mg/dL (ref 0.3–1.2)
Total Protein: 6.3 g/dL — ABNORMAL LOW (ref 6.5–8.1)

## 2015-05-08 LAB — CBC
HCT: 41.1 % (ref 35.0–47.0)
Hemoglobin: 13.9 g/dL (ref 12.0–16.0)
MCH: 32.3 pg (ref 26.0–34.0)
MCHC: 33.7 g/dL (ref 32.0–36.0)
MCV: 95.8 fL (ref 80.0–100.0)
PLATELETS: 322 10*3/uL (ref 150–440)
RBC: 4.29 MIL/uL (ref 3.80–5.20)
RDW: 13.8 % (ref 11.5–14.5)
WBC: 14.6 10*3/uL — ABNORMAL HIGH (ref 3.6–11.0)

## 2015-05-08 LAB — TROPONIN I

## 2015-05-08 MED ORDER — SODIUM CHLORIDE 0.9 % IV SOLN
1000.0000 mL | Freq: Once | INTRAVENOUS | Status: AC
  Administered 2015-05-08: 1000 mL via INTRAVENOUS

## 2015-05-08 NOTE — ED Notes (Signed)
Pt donated plasma today and when she got home pt began to feel faint and nauseous. Pt has hx of hypertension but had systolic in the 36'I upon EMS arrival

## 2015-05-08 NOTE — ED Provider Notes (Signed)
[facility] Emergency Department Provider Note  ____________________________________________  Time seen: On arrival, via EMS  I have reviewed the triage vital signs and the nursing notes.   HISTORY  Chief Complaint Dizziness    HPI Maria Warren is a 59 y.o. female who presents with dizziness. Patient reports she donated plasma today and when she arrived home she began to feel dizzy like she might faint. She also notes mild nausea. She denies chest pain she denies shortness of breath. She has had a mild cough no fevers. She continued to feel lightheaded currently. She reports prior to donating plasma she was feeling run down but not ill. She reports a history of 4 stents in her heart and Dr. Nehemiah Massed is her cardiologist    Past Medical History  Diagnosis Date  . Hypertension   . Thyroid disease     There are no active problems to display for this patient.   No past surgical history on file.  Current Outpatient Rx  Name  Route  Sig  Dispense  Refill  . albuterol (PROVENTIL HFA;VENTOLIN HFA) 108 (90 BASE) MCG/ACT inhaler   Inhalation   Inhale 2 puffs into the lungs every 4 (four) hours as needed for wheezing or shortness of breath.         Marland Kitchen aspirin EC 81 MG tablet   Oral   Take 162 mg by mouth daily.         Marland Kitchen atorvastatin (LIPITOR) 40 MG tablet   Oral   Take 40 mg by mouth daily.         . busPIRone (BUSPAR) 10 MG tablet   Oral   Take 10 mg by mouth 3 (three) times daily.         . calcitRIOL (ROCALTROL) 0.25 MCG capsule   Oral   Take 0.25 mcg by mouth daily.         . Fluticasone-Salmeterol (ADVAIR) 250-50 MCG/DOSE AEPB   Inhalation   Inhale 1 puff into the lungs 2 (two) times daily.         . hydrOXYzine (ATARAX/VISTARIL) 25 MG tablet   Oral   Take 25 mg by mouth 3 (three) times daily as needed.         . isosorbide mononitrate (IMDUR) 30 MG 24 hr tablet   Oral   Take 30 mg by mouth daily.         Marland Kitchen levothyroxine  (SYNTHROID, LEVOTHROID) 150 MCG tablet   Oral   Take 150 mcg by mouth daily before breakfast.         . lisinopril-hydrochlorothiazide (PRINZIDE,ZESTORETIC) 10-12.5 MG per tablet   Oral   Take 1 tablet by mouth daily.         Marland Kitchen omeprazole (PRILOSEC) 20 MG capsule   Oral   Take 20 mg by mouth daily.         Marland Kitchen tiotropium (SPIRIVA) 18 MCG inhalation capsule   Inhalation   Place 18 mcg into inhaler and inhale daily.         Marland Kitchen tiZANidine (ZANAFLEX) 2 MG tablet   Oral   Take 2 mg by mouth every 6 (six) hours as needed for muscle spasms.           Allergies Lyrica; Topamax; Zoloft; and Bupropion  No family history on file.  Social History History  Substance Use Topics  . Smoking status: Not on file  . Smokeless tobacco: Not on file  . Alcohol Use: Not on file  positive smoker, occasional alcohol  Review of Systems  Constitutional: Negative for fever. Eyes: Negative for visual changes. ENT: Negative for sore throat Cardiovascular: Negative for chest pain. Respiratory: Negative for shortness of breath. Gastrointestinal: Positive for nausea Genitourinary: Negative for dysuria. Musculoskeletal: Negative for back pain. Skin: Negative for rash. Neurological: Negative for headaches  Psychiatric: No anxiety  10-point ROS otherwise negative.  ____________________________________________   PHYSICAL EXAM:  VITAL SIGNS: ED Triage Vitals  Enc Vitals Group     BP 05/08/15 1542 82/60 mmHg     Pulse Rate 05/08/15 1542 85     Resp 05/08/15 1542 18     Temp 05/08/15 1542 97.9 F (36.6 C)     Temp Source 05/08/15 1542 Oral     SpO2 05/08/15 1542 94 %     Weight --      Height --      Head Cir --      Peak Flow --      Pain Score --      Pain Loc --      Pain Edu? --      Excl. in [place]? --      Constitutional: Alert and oriented. No acute distress Eyes: Conjunctivae are normal.  ENT   Head: Normocephalic and atraumatic.   Mouth/Throat: Mucous  membranes are moist. Cardiovascular: Normal rate, regular rhythm. Normal and symmetric distal pulses are present in all extremities. No murmurs, rubs, or gallops. Respiratory: Normal respiratory effort without tachypnea nor retractions. Breath sounds are clear and equal bilaterally.  Gastrointestinal: Soft and non-tender in all quadrants. No distention. There is no CVA tenderness. Genitourinary: deferred Musculoskeletal: Nontender with normal range of motion in all extremities. No lower extremity tenderness nor edema. Neurologic:  Normal speech and language. No gross focal neurologic deficits are appreciated. Skin:  Skin is warm, dry and intact. No rash noted. Psychiatric: Mood and affect are normal. Patient exhibits appropriate insight and judgment.  ____________________________________________    LABS (pertinent positives/negatives)  Labs Reviewed  CBC - Abnormal; Notable for the following:    WBC 14.6 (*)    All other components within normal limits  COMPREHENSIVE METABOLIC PANEL - Abnormal; Notable for the following:    Glucose, Bld 153 (*)    Creatinine, Ser 1.02 (*)    Calcium 7.4 (*)    Total Protein 6.3 (*)    Albumin 3.4 (*)    GFR calc non Af Amer 59 (*)    All other components within normal limits  TROPONIN I  URINALYSIS COMPLETEWITH MICROSCOPIC ([facility] ONLY)    ____________________________________________   EKG  ED ECG REPORT I, Lavonia Drafts, the attending physician, personally viewed and interpreted this ECG.   Date: 05/08/2015  EKG Time: 3:45 PM  Rate: 86  Rhythm: normal sinus rhythm, prolonged QT interval  Axis: Normal axis  Intervals:none  ST&T Change: T wave inversions V1 through V4, this is seen on old EKG from 2015   ____________________________________________    RADIOLOGY I have personally reviewed any xrays that were ordered on this patient: Chest x ray  unremarkable  ____________________________________________   PROCEDURES  Procedure(s) performed: none  Critical Care performed: none  ____________________________________________   INITIAL IMPRESSION / ASSESSMENT AND PLAN / ED COURSE  Pertinent labs & imaging results that were available during my care of the patient were reviewed by me and considered in my medical decision making (see chart for details).  Patient with hypotension and ED, this could certainly be related to plasma donation. We  will start with IV fluids check labs, x-ray, urine and reevaluate.  ----------------------------------------- 5:02 PM on 05/08/2015 -----------------------------------------  Blood pressure is improved to 100/60. We will give another liter patient reports feeling improved. Lab work significant for mildly elevated white blood cell count, unclear cause no evidence of infection time  ____________________________________________ ----------------------------------------- 7:16 PM on 05/08/2015 -----------------------------------------  Patient ambulating around the room without difficulty. No longer has dizziness. Labs reassuring besides mildly elevated white blood cell count but no evidence of infection.  FINAL CLINICAL IMPRESSION(S) / ED DIAGNOSES  Final diagnoses:  Dizziness  Other specified hypotension     Lavonia Drafts, MD 05/08/15 [phone]

## 2015-05-08 NOTE — Discharge Instructions (Signed)

## 2015-05-08 NOTE — ED Notes (Signed)
Pt currently endorses nausea and dizziness but denies CP

## 2015-06-02 ENCOUNTER — Ambulatory Visit: Payer: Medicaid Other | Admitting: Psychiatry

## 2015-08-24 DIAGNOSIS — I10 Essential (primary) hypertension: Secondary | ICD-10-CM | POA: Insufficient documentation

## 2015-10-27 DIAGNOSIS — E559 Vitamin D deficiency, unspecified: Secondary | ICD-10-CM | POA: Insufficient documentation

## 2015-11-24 ENCOUNTER — Ambulatory Visit: Payer: Medicare Other | Admitting: Anesthesiology

## 2015-11-24 ENCOUNTER — Ambulatory Visit
Admission: RE | Admit: 2015-11-24 | Discharge: 2015-11-24 | Disposition: A | Discharge status: Home / Self Care | Payer: Medicare Other | Source: Ambulatory Visit | Attending: Gastroenterology | Admitting: Gastroenterology

## 2015-11-24 ENCOUNTER — Encounter
Admission: RE | Disposition: A | Discharge status: Home / Self Care | Payer: Self-pay | Source: Ambulatory Visit | Attending: Gastroenterology

## 2015-11-24 ENCOUNTER — Encounter: Payer: Self-pay | Admitting: *Deleted

## 2015-11-24 DIAGNOSIS — I10 Essential (primary) hypertension: Secondary | ICD-10-CM | POA: Diagnosis not present

## 2015-11-24 DIAGNOSIS — K319 Disease of stomach and duodenum, unspecified: Secondary | ICD-10-CM | POA: Diagnosis not present

## 2015-11-24 DIAGNOSIS — Z7982 Long term (current) use of aspirin: Secondary | ICD-10-CM | POA: Insufficient documentation

## 2015-11-24 DIAGNOSIS — F17200 Nicotine dependence, unspecified, uncomplicated: Secondary | ICD-10-CM | POA: Insufficient documentation

## 2015-11-24 DIAGNOSIS — Z79899 Other long term (current) drug therapy: Secondary | ICD-10-CM | POA: Insufficient documentation

## 2015-11-24 DIAGNOSIS — Z6827 Body mass index (BMI) 27.0-27.9, adult: Secondary | ICD-10-CM | POA: Diagnosis not present

## 2015-11-24 DIAGNOSIS — K2900 Acute gastritis without bleeding: Secondary | ICD-10-CM | POA: Diagnosis not present

## 2015-11-24 DIAGNOSIS — J449 Chronic obstructive pulmonary disease, unspecified: Secondary | ICD-10-CM | POA: Insufficient documentation

## 2015-11-24 DIAGNOSIS — Q438 Other specified congenital malformations of intestine: Secondary | ICD-10-CM | POA: Insufficient documentation

## 2015-11-24 DIAGNOSIS — E669 Obesity, unspecified: Secondary | ICD-10-CM | POA: Diagnosis not present

## 2015-11-24 DIAGNOSIS — K5730 Diverticulosis of large intestine without perforation or abscess without bleeding: Secondary | ICD-10-CM | POA: Insufficient documentation

## 2015-11-24 DIAGNOSIS — Z7951 Long term (current) use of inhaled steroids: Secondary | ICD-10-CM | POA: Diagnosis not present

## 2015-11-24 DIAGNOSIS — Z888 Allergy status to other drugs, medicaments and biological substances status: Secondary | ICD-10-CM | POA: Insufficient documentation

## 2015-11-24 DIAGNOSIS — F419 Anxiety disorder, unspecified: Secondary | ICD-10-CM | POA: Diagnosis not present

## 2015-11-24 DIAGNOSIS — I2510 Atherosclerotic heart disease of native coronary artery without angina pectoris: Secondary | ICD-10-CM | POA: Insufficient documentation

## 2015-11-24 DIAGNOSIS — R195 Other fecal abnormalities: Secondary | ICD-10-CM | POA: Diagnosis present

## 2015-11-24 DIAGNOSIS — E079 Disorder of thyroid, unspecified: Secondary | ICD-10-CM | POA: Insufficient documentation

## 2015-11-24 HISTORY — PX: ESOPHAGOGASTRODUODENOSCOPY (EGD) WITH PROPOFOL: SHX5813

## 2015-11-24 HISTORY — PX: COLONOSCOPY: SHX5424

## 2015-11-24 HISTORY — DX: Atherosclerotic heart disease of native coronary artery without angina pectoris: I25.10

## 2015-11-24 SURGERY — ESOPHAGOGASTRODUODENOSCOPY (EGD) WITH PROPOFOL
Anesthesia: General

## 2015-11-24 MED ORDER — FENTANYL CITRATE (PF) 100 MCG/2ML IJ SOLN
INTRAMUSCULAR | Status: DC | PRN
Start: 2015-11-24 — End: 2015-11-24
  Administered 2015-11-24: 50 ug via INTRAVENOUS

## 2015-11-24 MED ORDER — MIDAZOLAM HCL 2 MG/2ML IJ SOLN
INTRAMUSCULAR | Status: DC | PRN
  Administered 2015-11-24: 1 mg via INTRAVENOUS

## 2015-11-24 MED ORDER — PROPOFOL 500 MG/50ML IV EMUL
INTRAVENOUS | Status: DC | PRN
  Administered 2015-11-24: 120 ug/kg/min via INTRAVENOUS

## 2015-11-24 MED ORDER — PROPOFOL 10 MG/ML IV BOLUS
INTRAVENOUS | Status: DC | PRN
  Administered 2015-11-24 (×2): 30 mg via INTRAVENOUS

## 2015-11-24 MED ORDER — SODIUM CHLORIDE 0.9 % IV SOLN: INTRAVENOUS | Status: DC

## 2015-11-24 MED ORDER — SODIUM CHLORIDE 0.9 % IV SOLN
INTRAVENOUS | Status: DC
  Administered 2015-11-24: 1000 mL via INTRAVENOUS

## 2015-11-24 NOTE — Op Note (Signed)
[facility] Gastroenterology Patient Name: Maria Warren Procedure Date: 11/24/2015 1:16 PM MRN: [id number] Account #: [id number] Date of Birth: 12-11-1955 Admit Type: Outpatient Age: 60 Room: [facility] ENDO ROOM 4 Gender: Female Note Status: Finalized Procedure:         Colonoscopy Indications:       Heme positive stool Providers:         Lollie Sails, MD Referring MD:      Marrianne Mood, MD (Referring MD) Medicines:         Monitored Anesthesia Care Complications:     No immediate complications. Procedure:         Pre-Anesthesia Assessment:                    - ASA Grade Assessment: III - A patient with severe                     systemic disease.                    After obtaining informed consent, the colonoscope was                     passed under direct vision. Throughout the procedure, the                     patient's blood pressure, pulse, and oxygen saturations                     were monitored continuously. The Colonoscope was                     introduced through the anus and advanced to the the cecum,                     identified by appendiceal orifice and ileocecal valve. The                     colonoscopy was unusually difficult due to restricted                     mobility of the colon, significant looping and a tortuous                     colon. Successful completion of the procedure was aided by                     changing the patient to a supine position, changing the                     patient to a prone position and using manual pressure. Findings:      Multiple small to medium diverticula were found in the sigmoid colon, in       the descending colon, in the transverse colon and in the ascending colon.      The sigmoid colon, descending colon and transverse colon were moderately       redundant.      The retroflexed view of the distal rectum and anal verge was normal and       showed no anal or rectal abnormalities.      The digital  rectal exam was normal. Impression:        - Diverticulosis in the sigmoid colon, in the descending  colon, in the transverse colon and in the ascending colon.                    - Redundant colon.                    - The distal rectum and anal verge are normal on                     retroflexion view.                    - No specimens collected. Recommendation:    - Repeat colonoscopy in 10 years for screening purposes.                    - Return to GI clinic in 1 month. Procedure Code(s): --- Professional ---                    [phone], Colonoscopy, flexible; diagnostic, including                     collection of specimen(s) by brushing or washing, when                     performed (separate procedure) Diagnosis Code(s): --- Professional ---                    R19.5, Other fecal abnormalities                    K57.30, Diverticulosis of large intestine without                     perforation or abscess without bleeding                    Q43.8, Other specified congenital malformations of                     intestine CPT copyright 2014 American Medical Association. All rights reserved. The codes documented in this report are preliminary and upon coder review may  be revised to meet current compliance requirements. Lollie Sails, MD 11/24/2015 2:46:43 PM This report has been signed electronically. Number of Addenda: 0 Note Initiated On: 11/24/2015 1:16 PM Scope Withdrawal Time: 0 hours 6 minutes 32 seconds  Total Procedure Duration: 0 hours 52 minutes 39 seconds       [facility]

## 2015-11-24 NOTE — Anesthesia Postprocedure Evaluation (Signed)
Anesthesia Post Note  Patient: Maria Warren  Procedure(s) Performed: Procedure(s) (LRB): ESOPHAGOGASTRODUODENOSCOPY (EGD) WITH PROPOFOL (N/A) COLONOSCOPY (N/A)  Patient location during evaluation: PACU Anesthesia Type: General Level of consciousness: awake Pain management: pain level controlled Vital Signs Assessment: post-procedure vital signs reviewed and stable Respiratory status: spontaneous breathing Cardiovascular status: stable Anesthetic complications: no    Last Vitals:  Filed Vitals:   11/24/15 1252  BP: 154/86  Pulse: 77  Temp: 37.2 C  Resp: 18    Last Pain:  Filed Vitals:   11/24/15 1256  PainSc: 10-Worst pain ever                 VAN STAVEREN,Keiry Kowal

## 2015-11-24 NOTE — Transfer of Care (Signed)
Immediate Anesthesia Transfer of Care Note  Patient: Maria Warren  Procedure(s) Performed: Procedure(s): ESOPHAGOGASTRODUODENOSCOPY (EGD) WITH PROPOFOL (N/A) COLONOSCOPY (N/A)  Patient Location: PACU  Anesthesia Type:General  Level of Consciousness: awake  Airway & Oxygen Therapy: Patient Spontanous Breathing  Post-op Assessment: Report given to RN  Post vital signs: Reviewed  Last Vitals:  Filed Vitals:   11/24/15 1252  BP: 154/86  Pulse: 77  Temp: 37.2 C  Resp: 18    Complications: No apparent anesthesia complications

## 2015-11-24 NOTE — Anesthesia Preprocedure Evaluation (Signed)
Anesthesia Evaluation  Patient identified by MRN, date of birth, ID band Patient awake    Reviewed: Allergy & Precautions, NPO status , Patient's Chart, lab work & pertinent test results  Airway Mallampati: II       Dental  (+) Upper Dentures, Lower Dentures   Pulmonary COPD,  COPD inhaler, Current Smoker,    + rhonchi  + decreased breath sounds      Cardiovascular hypertension, Pt. on home beta blockers + CAD   Rhythm:Regular Rate:Normal     Neuro/Psych Anxiety    GI/Hepatic negative GI ROS, Neg liver ROS,   Endo/Other    Renal/GU negative Renal ROS     Musculoskeletal   Abdominal (+) + obese,   Peds  Hematology negative hematology ROS (+)   Anesthesia Other Findings   Reproductive/Obstetrics                             Anesthesia Physical Anesthesia Plan  ASA: III  Anesthesia Plan: General   Post-op Pain Management:    Induction: Intravenous  Airway Management Planned: Nasal Cannula  Additional Equipment:   Intra-op Plan:   Post-operative Plan:   Informed Consent: I have reviewed the patients History and Physical, chart, labs and discussed the procedure including the risks, benefits and alternatives for the proposed anesthesia with the patient or authorized representative who has indicated his/her understanding and acceptance.     Plan Discussed with:   Anesthesia Plan Comments:         Anesthesia Quick Evaluation

## 2015-11-24 NOTE — H&P (Signed)
Outpatient short stay form Pre-procedure 11/24/2015 1:17 PM Lollie Sails MD  Primary Physician: [facility]  Reason for visit:  EGD and colonoscopy  History of present illness:  Patient is a 60 year old female presenting today for EGD and colonoscopy. She is found to be Hemoccult positive on routine testing at her primary physician's office. She does take BC powders and Aleve in the past. Is also taking 81 mg aspirin. He tolerated her prep well. She states that she has discontinued aspirin products. She takes no blood thinners.  Her last colonoscopy on 07/19/2011 was incomplete due to tethering in the sigmoid and tortuosity. He did have some diverticulosis. EGD on same date showed her to have some gastritis. She had a CT colonoscopy that was negative but suboptimal in the sigmoid region.    Current facility-administered medications:  .  0.9 %  sodium chloride infusion, , Intravenous, Continuous, Lollie Sails, MD, Last Rate: 20 mL/hr at 11/24/15 1305, 1,000 mL at 11/24/15 1305 .  0.9 %  sodium chloride infusion, , Intravenous, Continuous, Lollie Sails, MD  Prescriptions prior to admission  Medication Sig Dispense Refill Last Dose  . albuterol (PROVENTIL HFA;VENTOLIN HFA) 108 (90 BASE) MCG/ACT inhaler Inhale 2 puffs into the lungs every 4 (four) hours as needed for wheezing or shortness of breath.   Past Week at Unknown time  . aspirin EC 81 MG tablet Take 162 mg by mouth daily.   Past Week at Unknown time  . atorvastatin (LIPITOR) 40 MG tablet Take 40 mg by mouth daily.   Past Week at Unknown time  . busPIRone (BUSPAR) 10 MG tablet Take 10 mg by mouth 3 (three) times daily.   Past Week at Unknown time  . calcitRIOL (ROCALTROL) 0.25 MCG capsule Take 0.25 mcg by mouth daily.   Past Week at Unknown time  . Fluticasone-Salmeterol (ADVAIR) 250-50 MCG/DOSE AEPB Inhale 1 puff into the lungs 2 (two) times daily.   Past Week at Unknown time  . hydrOXYzine (ATARAX/VISTARIL) 25 MG  tablet Take 25 mg by mouth 3 (three) times daily as needed.   Past Week at Unknown time  . isosorbide mononitrate (IMDUR) 30 MG 24 hr tablet Take 30 mg by mouth daily.   Past Week at Unknown time  . levothyroxine (SYNTHROID, LEVOTHROID) 150 MCG tablet Take 150 mcg by mouth daily before breakfast.   Past Week at Unknown time  . lisinopril-hydrochlorothiazide (PRINZIDE,ZESTORETIC) 10-12.5 MG per tablet Take 1 tablet by mouth daily.   Past Week at Unknown time  . omeprazole (PRILOSEC) 20 MG capsule Take 20 mg by mouth daily.   Past Week at Unknown time  . tiotropium (SPIRIVA) 18 MCG inhalation capsule Place 18 mcg into inhaler and inhale daily.   Past Week at Unknown time  . tiZANidine (ZANAFLEX) 2 MG tablet Take 2 mg by mouth every 6 (six) hours as needed for muscle spasms.   Past Week at Unknown time     Allergies  Allergen Reactions  . Lyrica [Pregabalin] Nausea Only  . Topamax [Topiramate] Nausea Only  . Zoloft [Sertraline Hcl] Nausea Only  . Bupropion Nausea Only    Light headed     Past Medical History  Diagnosis Date  . Hypertension   . Thyroid disease   . Coronary artery disease     Review of systems:      Physical Exam    Heart and lungs: Regular rate and rhythm without rub or gallop, lungs are bilaterally clear.  HEENT: Normal cephalic atraumatic eyes are anicteric    Other:     Pertinant exam for procedure: Soft nontender nondistended bowel sounds positive normoactive.    Planned proceedures: Colonoscopy, EGD and indicated procedures. I have discussed the risks benefits and complications of procedures to include not limited to bleeding, infection, perforation and the risk of sedation and the patient wishes to proceed.    Lollie Sails, MD Gastroenterology 11/24/2015  1:17 PM

## 2015-11-24 NOTE — Op Note (Signed)
[facility] Gastroenterology Patient Name: Maria Warren Procedure Date: 11/24/2015 1:17 PM MRN: [id number] Account #: [id number] Date of Birth: 1956/07/29 Admit Type: Outpatient Age: 60 Room: [facility] ENDO ROOM 4 Gender: Female Note Status: Finalized Procedure:         Upper GI endoscopy Indications:       Heme positive stool Providers:         Lollie Sails, MD Referring MD:      Marrianne Mood, MD (Referring MD) Medicines:         Monitored Anesthesia Care Complications:     No immediate complications. Procedure:         Pre-Anesthesia Assessment:                    - ASA Grade Assessment: III - A patient with severe                     systemic disease.                    After obtaining informed consent, the endoscope was passed                     under direct vision. Throughout the procedure, the                     patient's blood pressure, pulse, and oxygen saturations                     were monitored continuously. The Endoscope was introduced                     through the mouth, and advanced to the third part of                     duodenum. The upper GI endoscopy was accomplished without                     difficulty. The patient tolerated the procedure well. Findings:      The examined esophagus was normal.      Patchy mild to moderate inflammation characterized by erosions and       erythema was found in the gastric body and in the gastric antrum.       Biopsies were taken with a cold forceps for histology.      The cardia and gastric fundus were normal on retroflexion.      The examined duodenum was normal. Impression:        - Normal esophagus.                    - Erosive gastritis. Biopsied.                    - Normal examined duodenum. Recommendation:    - Continue present medications.                    - Use sucralfate tablets 1 gram PO QID for 2 months.                    - Return to GI clinic in 4 weeks. Procedure Code(s): ---  Professional ---                    [phone], Esophagogastroduodenoscopy, flexible, transoral;  with biopsy, single or multiple Diagnosis Code(s): --- Professional ---                    K29.60, Other gastritis without bleeding                    R19.5, Other fecal abnormalities CPT copyright 2014 American Medical Association. All rights reserved. The codes documented in this report are preliminary and upon coder review may  be revised to meet current compliance requirements. Lollie Sails, MD 11/24/2015 1:42:33 PM This report has been signed electronically. Number of Addenda: 0 Note Initiated On: 11/24/2015 1:17 PM      [facility]

## 2015-11-25 ENCOUNTER — Encounter: Payer: Self-pay | Admitting: Gastroenterology

## 2015-11-27 LAB — SURGICAL PATHOLOGY

## 2016-02-15 ENCOUNTER — Emergency Department
Admission: EM | Admit: 2016-02-15 | Discharge: 2016-02-16 | Disposition: A | Discharge status: Other Acute Inpatient Hospital | Payer: Medicare Other | Attending: Emergency Medicine | Admitting: Emergency Medicine

## 2016-02-15 ENCOUNTER — Encounter: Payer: Self-pay | Admitting: Emergency Medicine

## 2016-02-15 DIAGNOSIS — F1220 Cannabis dependence, uncomplicated: Secondary | ICD-10-CM

## 2016-02-15 DIAGNOSIS — I10 Essential (primary) hypertension: Secondary | ICD-10-CM | POA: Insufficient documentation

## 2016-02-15 DIAGNOSIS — I2510 Atherosclerotic heart disease of native coronary artery without angina pectoris: Secondary | ICD-10-CM | POA: Insufficient documentation

## 2016-02-15 DIAGNOSIS — Z888 Allergy status to other drugs, medicaments and biological substances status: Secondary | ICD-10-CM | POA: Insufficient documentation

## 2016-02-15 DIAGNOSIS — R531 Weakness: Secondary | ICD-10-CM | POA: Insufficient documentation

## 2016-02-15 DIAGNOSIS — M25562 Pain in left knee: Secondary | ICD-10-CM | POA: Insufficient documentation

## 2016-02-15 DIAGNOSIS — Z79899 Other long term (current) drug therapy: Secondary | ICD-10-CM | POA: Insufficient documentation

## 2016-02-15 DIAGNOSIS — R45851 Suicidal ideations: Secondary | ICD-10-CM | POA: Insufficient documentation

## 2016-02-15 DIAGNOSIS — F17210 Nicotine dependence, cigarettes, uncomplicated: Secondary | ICD-10-CM | POA: Insufficient documentation

## 2016-02-15 DIAGNOSIS — E039 Hypothyroidism, unspecified: Secondary | ICD-10-CM

## 2016-02-15 DIAGNOSIS — Z7982 Long term (current) use of aspirin: Secondary | ICD-10-CM | POA: Insufficient documentation

## 2016-02-15 DIAGNOSIS — F332 Major depressive disorder, recurrent severe without psychotic features: Secondary | ICD-10-CM

## 2016-02-15 DIAGNOSIS — R202 Paresthesia of skin: Secondary | ICD-10-CM | POA: Insufficient documentation

## 2016-02-15 LAB — URINE DRUG SCREEN, QUALITATIVE (ARMC ONLY)
Amphetamines, Ur Screen: NOT DETECTED
Barbiturates, Ur Screen: NOT DETECTED
Benzodiazepine, Ur Scrn: POSITIVE — AB
Cannabinoid 50 Ng, Ur ~~LOC~~: POSITIVE — AB
Cocaine Metabolite,Ur ~~LOC~~: NOT DETECTED
MDMA (Ecstasy)Ur Screen: NOT DETECTED
Methadone Scn, Ur: NOT DETECTED
Opiate, Ur Screen: NOT DETECTED
Phencyclidine (PCP) Ur S: NOT DETECTED
Tricyclic, Ur Screen: NOT DETECTED

## 2016-02-15 LAB — COMPREHENSIVE METABOLIC PANEL
ALBUMIN: 4 g/dL (ref 3.5–5.0)
ALK PHOS: 59 U/L (ref 38–126)
ALT: 14 U/L (ref 14–54)
ANION GAP: 7 (ref 5–15)
AST: 23 U/L (ref 15–41)
BUN: 9 mg/dL (ref 6–20)
CALCIUM: 7.9 mg/dL — AB (ref 8.9–10.3)
CO2: 27 mmol/L (ref 22–32)
Chloride: 104 mmol/L (ref 101–111)
Creatinine, Ser: 0.7 mg/dL (ref 0.44–1.00)
GFR calc non Af Amer: >60 mL/min (ref >60)
GLUCOSE: 86 mg/dL (ref 65–99)
POTASSIUM: 3 mmol/L — AB (ref 3.5–5.1)
SODIUM: 138 mmol/L (ref 135–145)
Total Bilirubin: 0.4 mg/dL (ref 0.3–1.2)
Total Protein: 7.5 g/dL (ref 6.5–8.1)

## 2016-02-15 LAB — CBC WITH DIFFERENTIAL/PLATELET
BASOS PCT: 1 %
Basophils Absolute: 0.1 10*3/uL (ref 0–0.1)
EOS ABS: 0.3 10*3/uL (ref 0–0.7)
EOS PCT: 3 %
HCT: 33 % — ABNORMAL LOW (ref 35.0–47.0)
HEMOGLOBIN: 11 g/dL — AB (ref 12.0–16.0)
LYMPHS ABS: 2.5 10*3/uL (ref 1.0–3.6)
Lymphocytes Relative: 23 %
MCH: 29.8 pg (ref 26.0–34.0)
MCHC: 33.4 g/dL (ref 32.0–36.0)
MCV: 89.2 fL (ref 80.0–100.0)
MONO ABS: 0.7 10*3/uL (ref 0.2–0.9)
MONOS PCT: 7 %
NEUTROS PCT: 66 %
Neutro Abs: 7.3 10*3/uL — ABNORMAL HIGH (ref 1.4–6.5)
PLATELETS: 247 10*3/uL (ref 150–440)
RBC: 3.7 MIL/uL — ABNORMAL LOW (ref 3.80–5.20)
RDW: 15.6 % — AB (ref 11.5–14.5)
WBC: 10.9 10*3/uL (ref 3.6–11.0)

## 2016-02-15 LAB — ETHANOL: Alcohol, Ethyl (B): <5 mg/dL (ref <5)

## 2016-02-15 LAB — SALICYLATE LEVEL

## 2016-02-15 LAB — ACETAMINOPHEN LEVEL: Acetaminophen (Tylenol), Serum: <10 ug/mL — ABNORMAL LOW (ref 10–30)

## 2016-02-15 MED ORDER — LORAZEPAM 2 MG/ML IJ SOLN
1.0000 mg | Freq: Once | INTRAMUSCULAR | Status: AC
  Administered 2016-02-15: 1 mg via INTRAVENOUS
  Filled 2016-02-15: qty 1

## 2016-02-15 MED ORDER — TRAMADOL HCL 50 MG PO TABS: 50.0000 mg | ORAL_TABLET | Freq: Four times a day (QID) | ORAL | Status: DC | PRN

## 2016-02-15 MED ORDER — OXYCODONE-ACETAMINOPHEN 5-325 MG PO TABS
1.0000 | ORAL_TABLET | Freq: Once | ORAL | Status: AC
Start: 2016-02-15 — End: 2016-02-15
  Administered 2016-02-15: 1 via ORAL
  Filled 2016-02-15: qty 1

## 2016-02-15 NOTE — Discharge Instructions (Signed)
Joint Pain Joint pain, which is also called arthralgia, can be caused by many things. Joint pain often goes away when you follow your health care provider's instructions for relieving pain at home. However, joint pain can also be caused by conditions that require further treatment. Common causes of joint pain include:  Bruising in the area of the joint.  Overuse of the joint.  Wear and tear on the joints that occur with aging (osteoarthritis).  Various other forms of arthritis.  A buildup of a crystal form of uric acid in the joint (gout).  Infections of the joint (septic arthritis) or of the bone (osteomyelitis). Your health care provider may recommend medicine to help with the pain. If your joint pain continues, additional tests may be needed to diagnose your condition. HOME CARE INSTRUCTIONS Watch your condition for any changes. Follow these instructions as directed to lessen the pain that you are feeling.  Take medicines only as directed by your health care provider.  Rest the affected area for as long as your health care provider says that you should. If directed to do so, raise the painful joint above the level of your heart while you are sitting or lying down.  Do not do things that cause or worsen pain.  If directed, apply ice to the painful area:  Put ice in a plastic bag.  Place a towel between your skin and the bag.  Leave the ice on for 20 minutes, 2-3 times per day.  Wear an elastic bandage, splint, or sling as directed by your health care provider. Loosen the elastic bandage or splint if your fingers or toes become numb and tingle, or if they turn cold and blue.  Begin exercising or stretching the affected area as directed by your health care provider. Ask your health care provider what types of exercise are safe for you.  Keep all follow-up visits as directed by your health care provider. This is important. SEEK MEDICAL CARE IF:  Your pain increases, and medicine  does not help.  Your joint pain does not improve within 3 days.  You have increased bruising or swelling.  You have a fever.  You lose 10 lb (4.5 kg) or more without trying. SEEK IMMEDIATE MEDICAL CARE IF:  You are not able to move the joint.  Your fingers or toes become numb or they turn cold and blue.   This information is not intended to replace advice given to you by your health care provider. Make sure you discuss any questions you have with your health care provider.   Document Released: 10/17/2005 Document Revised: 11/07/2014 Document Reviewed: 07/29/2014 Elsevier Interactive Patient Education 2016 Elsevier Inc.  Knee Pain Knee pain is a very common symptom and can have many causes. Knee pain often goes away when you follow your health care provider's instructions for relieving pain and discomfort at home. However, knee pain can develop into a condition that needs treatment. Some conditions may include:  Arthritis caused by wear and tear (osteoarthritis).  Arthritis caused by swelling and irritation (rheumatoid arthritis or gout).  A cyst or growth in your knee.  An infection in your knee joint.  An injury that will not heal.  Damage, swelling, or irritation of the tissues that support your knee (torn ligaments or tendinitis). If your knee pain continues, additional tests may be ordered to diagnose your condition. Tests may include X-rays or other imaging studies of your knee. You may also need to have fluid removed from your  knee. Treatment for ongoing knee pain depends on the cause, but treatment may include:  Medicines to relieve pain or swelling.  Steroid injections in your knee.  Physical therapy.  Surgery. HOME CARE INSTRUCTIONS  Take medicines only as directed by your health care provider.  Rest your knee and keep it raised (elevated) while you are resting.  Do not do things that cause or worsen pain.  Avoid high-impact activities or exercises, such  as running, jumping rope, or doing jumping jacks.  Apply ice to the knee area:  Put ice in a plastic bag.  Place a towel between your skin and the bag.  Leave the ice on for 20 minutes, 2-3 times a day.  Ask your health care provider if you should wear an elastic knee support.  Keep a pillow under your knee when you sleep.  Lose weight if you are overweight. Extra weight can put pressure on your knee.  Do not use any tobacco products, including cigarettes, chewing tobacco, or electronic cigarettes. If you need help quitting, ask your health care provider. Smoking may slow the healing of any bone and joint problems that you may have. SEEK MEDICAL CARE IF:  Your knee pain continues, changes, or gets worse.  You have a fever along with knee pain.  Your knee buckles or locks up.  Your knee becomes more swollen. SEEK IMMEDIATE MEDICAL CARE IF:   Your knee joint feels hot to the touch.  You have chest pain or trouble breathing.   This information is not intended to replace advice given to you by your health care provider. Make sure you discuss any questions you have with your health care provider.   Document Released: 08/14/2007 Document Revised: 11/07/2014 Document Reviewed: 06/02/2014 Elsevier Interactive Patient Education 2016 Elsevier Inc.  Paresthesia Paresthesia is an abnormal burning or prickling sensation. This sensation is generally felt in the hands, arms, legs, or feet. However, it may occur in any part of the body. Usually, it is not painful. The feeling may be described as:  Tingling or numbness.  Pins and needles.  Skin crawling.  Buzzing.  Limbs falling asleep.  Itching. Most people experience temporary (transient) paresthesia at some time in their lives. Paresthesia may occur when you breathe too quickly (hyperventilation). It can also occur without any apparent cause. Commonly, paresthesia occurs when pressure is placed on a nerve. The sensation quickly  goes away after the pressure is removed. For some people, however, paresthesia is a long-lasting (chronic) condition that is caused by an underlying disorder. If you continue to have paresthesia, you may need further medical evaluation. HOME CARE INSTRUCTIONS Watch your condition for any changes. Taking the following actions may help to lessen any discomfort that you are feeling:  Avoid drinking alcohol.  Try acupuncture or massage to help relieve your symptoms.  Keep all follow-up visits as directed by your health care provider. This is important. SEEK MEDICAL CARE IF:  You continue to have episodes of paresthesia.  Your burning or prickling feeling gets worse when you walk.  You have pain, cramps, or dizziness.  You develop a rash. SEEK IMMEDIATE MEDICAL CARE IF:  You feel weak.  You have trouble walking or moving.  You have problems with speech, understanding, or vision.  You feel confused.  You cannot control your bladder or bowel movements.  You have numbness after an injury.  You faint.   This information is not intended to replace advice given to you by your health care provider.  Make sure you discuss any questions you have with your health care provider.   Document Released: 10/07/2002 Document Revised: 03/03/2015 Document Reviewed: 10/13/2014 Elsevier Interactive Patient Education [employer].

## 2016-02-15 NOTE — ED Notes (Signed)
Pt. To BHU from ED ambulatory without difficulty, to room  BHU 5. Report from [facility]. Pt. Is alert and oriented, warm and dry in no distress. Pt. Denies SI, HI, and AVH. Pt. Calm and cooperative. Pt. Made aware of security cameras and Q15 minute rounds. Pt. Encouraged to let Nursing staff know of any concerns or needs.

## 2016-02-15 NOTE — ED Notes (Signed)
Pt presents to ED with complaints of tingling in arms and legs. Pt reports headache for three months. Pt states she just doesn't feel right. Pt states she feels the same way she felt when her calcium levels were off. Pt reports left knee pain. Pt denies injury. Pt alert and oriented in triage and speaking in complete sentences.

## 2016-02-15 NOTE — ED Notes (Addendum)
Upon this RN entry to room to discharge patient, she reports having thoughts of hurting herself. Pt states she feels alone and everyone is taking advantage of her. When asked if pt has a plan, pt states "I would take all my meds and just lay down and not wake up." Patient reports has been feeling this way x 2 weeks. Pt reports was hospitalized in this [facility] about 2 years ago for similar symptoms. Dr. Kerman Passey in room to explain to patient she will see a psychiatrist tomorrow. Pt verbally acknowledged and will stay to be evaluated.

## 2016-02-15 NOTE — ED Provider Notes (Addendum)
[facility] Emergency Department Provider Note  Time seen: 5:48 PM  I have reviewed the triage vital signs and the nursing notes.   HISTORY  Chief Complaint Numbness and Knee Pain    HPI Maria Warren is a 60 y.o. female with a past medical history of hypertension, presents to the emergency departmentwith complaints of tingling all over her body. According to the patient for the past 3-4 days she has felt a sensation of tingling/pins and needles over her entire body. Denies any numbness. States her left knee has been hurting her as well. She states overall for the past 3 months she has not been feeling well, states occasional headaches and increased fatigue. Patient states she had similar symptoms several years ago when she was diagnosed with hypocalcemia requiring admission to the hospital. Patient also states a history of anxiety, which she admits could be causing her symptoms as well. Denies any chest pain. Denies any headache currently. Denies any focal weakness or numbness.     Past Medical History  Diagnosis Date  . Hypertension   . Thyroid disease   . Coronary artery disease     There are no active problems to display for this patient.   Past Surgical History  Procedure Laterality Date  . Total thyroidectomy    . Cesarean section    . Esophagogastroduodenoscopy (egd) with propofol N/A 11/24/2015    Procedure: ESOPHAGOGASTRODUODENOSCOPY (EGD) WITH PROPOFOL;  Surgeon: Lollie Sails, MD;  Location: [facility] ENDOSCOPY;  Service: Endoscopy;  Laterality: N/A;  . Colonoscopy N/A 11/24/2015    Procedure: COLONOSCOPY;  Surgeon: Lollie Sails, MD;  Location: [facility] ENDOSCOPY;  Service: Endoscopy;  Laterality: N/A;    Current Outpatient Rx  Name  Route  Sig  Dispense  Refill  . albuterol (PROVENTIL HFA;VENTOLIN HFA) 108 (90 BASE) MCG/ACT inhaler   Inhalation   Inhale 2 puffs into the lungs every 4 (four) hours as needed for wheezing or shortness of  breath.         Marland Kitchen aspirin EC 81 MG tablet   Oral   Take 162 mg by mouth daily.         Marland Kitchen atorvastatin (LIPITOR) 40 MG tablet   Oral   Take 40 mg by mouth daily.         . busPIRone (BUSPAR) 10 MG tablet   Oral   Take 10 mg by mouth 3 (three) times daily.         . calcitRIOL (ROCALTROL) 0.25 MCG capsule   Oral   Take 0.25 mcg by mouth daily.         . Fluticasone-Salmeterol (ADVAIR) 250-50 MCG/DOSE AEPB   Inhalation   Inhale 1 puff into the lungs 2 (two) times daily.         . hydrOXYzine (ATARAX/VISTARIL) 25 MG tablet   Oral   Take 25 mg by mouth 3 (three) times daily as needed.         . isosorbide mononitrate (IMDUR) 30 MG 24 hr tablet   Oral   Take 30 mg by mouth daily.         Marland Kitchen levothyroxine (SYNTHROID, LEVOTHROID) 150 MCG tablet   Oral   Take 150 mcg by mouth daily before breakfast.         . lisinopril-hydrochlorothiazide (PRINZIDE,ZESTORETIC) 10-12.5 MG per tablet   Oral   Take 1 tablet by mouth daily.         Marland Kitchen omeprazole (PRILOSEC) 20 MG capsule  Oral   Take 20 mg by mouth daily.         Marland Kitchen tiotropium (SPIRIVA) 18 MCG inhalation capsule   Inhalation   Place 18 mcg into inhaler and inhale daily.         Marland Kitchen tiZANidine (ZANAFLEX) 2 MG tablet   Oral   Take 2 mg by mouth every 6 (six) hours as needed for muscle spasms.           Allergies Lyrica; Topamax; Zoloft; and Bupropion  No family history on file.  Social History Social History  Substance Use Topics  . Smoking status: Current Every Day Smoker -- 1.00 packs/day for 25 years    Types: Cigarettes  . Smokeless tobacco: Never Used  . Alcohol Use: Yes    Review of Systems Constitutional: Negative for fever.Tingling sensation over her entire body. Cardiovascular: Negative for chest pain. Respiratory: Negative for shortness of breath. Gastrointestinal: Negative for abdominal pain Genitourinary: Negative for dysuria. Skin: Negative for rash. Neurological:  Intermittent headaches over the past 3 months. Denies focal weakness or numbness. 10-point ROS otherwise negative.  ____________________________________________   PHYSICAL EXAM:  VITAL SIGNS: ED Triage Vitals  Enc Vitals Group     BP 02/15/16 1618 124/78 mmHg     Pulse Rate 02/15/16 1618 76     Resp 02/15/16 1618 20     Temp 02/15/16 1618 98.4 F (36.9 C)     Temp Source 02/15/16 1618 Oral     SpO2 02/15/16 1618 94 %     Weight 02/15/16 1618 183 lb (83.008 kg)     Height 02/15/16 1618 5\' 6"  (1.676 m)     Head Cir --      Peak Flow --      Pain Score 02/15/16 1618 10     Pain Loc --      Pain Edu? --      Excl. in [place]? --     Constitutional: Alert and oriented. Well appearing and in no distress. Eyes: Normal exam ENT   Head: Normocephalic and atraumatic.   Mouth/Throat: Mucous membranes are moist. Cardiovascular: Normal rate, regular rhythm. No murmur Respiratory: Normal respiratory effort without tachypnea nor retractions. Breath sounds are clear Gastrointestinal: Soft and nontender. No distention.   Musculoskeletal: Nontender with normal range of motion in all extremities. Neurologic:  Normal speech and language. No gross focal neurologic deficits  Skin:  Skin is warm, dry and intact.  Psychiatric: Mood and affect are normal. Speech and behavior are normal.   ____________________________________________    INITIAL IMPRESSION / ASSESSMENT AND PLAN / ED COURSE  Pertinent labs & imaging results that were available during my care of the patient were reviewed by me and considered in my medical decision making (see chart for details).  Patient presents the emergency department with 3 months of not feeling well including intermittent headaches and increased weakness/fatigue. For the past 3 days she has been feeling pins and needle/tingling sensation over her entire body. Patient has a history of anxiety admits that could be contributing to her symptoms. She also  states she had similar symptoms several years ago when she was diagnosed with hypocalcemia requiring admission to the hospital. Since she has had part of her thyroid removed. We will check basic labs, and closely monitor in the emergency department. We'll dose IV Ativan and monitor for symptom relief.  Patient states the paresthesia/tingling are gone after Ativan. Continues to state significant left knee pain which she states has been an ongoing  issue for years but has worsened over the past few months. I will refer the patient to orthopedics. We'll discharge with a short course of Ultram. Patient agreeable to plan.  ----------------------------------------- 8:06 PM on 02/15/2016 -----------------------------------------  As the nurse was attempting to discharge patient she states last 2 weeks she has been having thoughts of hurting herself and now she is having thoughts of overdosing on all of her medications to kill herself. I spoke to the patient about this, she states she has nothing to live for. We'll place the patient under an involuntary commitment and have psychiatry evaluate. ____________________________________________   FINAL CLINICAL IMPRESSION(S) / ED DIAGNOSES  Paresthesias Generalized weakness Left knee pain Suicidal ideation  Harvest Dark, MD 02/15/16 1940  Harvest Dark, MD 02/15/16 2007

## 2016-02-15 NOTE — ED Notes (Signed)
Pt c/o left knee pain 10/10 and neck pain 10/10. Pt denies known injury, reports woke up with pain yesterday morning. Pt denies lifting any heavy objects. No swelling or obvious deformity noted to left knee or neck. MD notified of pt pain level.

## 2016-02-15 NOTE — ED Notes (Signed)
TTS in room speaking with patient.
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
## 2016-02-16 ENCOUNTER — Inpatient Hospital Stay
Admission: EM | Admit: 2016-02-16 | Discharge: 2016-02-22 | DRG: 885 | Disposition: A | Discharge status: Home / Self Care | Payer: 59 | Source: Intra-hospital | Attending: Psychiatry | Admitting: Psychiatry

## 2016-02-16 DIAGNOSIS — I2510 Atherosclerotic heart disease of native coronary artery without angina pectoris: Secondary | ICD-10-CM

## 2016-02-16 DIAGNOSIS — M25569 Pain in unspecified knee: Secondary | ICD-10-CM | POA: Diagnosis present

## 2016-02-16 DIAGNOSIS — R45851 Suicidal ideations: Secondary | ICD-10-CM | POA: Diagnosis present

## 2016-02-16 DIAGNOSIS — Z888 Allergy status to other drugs, medicaments and biological substances status: Secondary | ICD-10-CM | POA: Diagnosis not present

## 2016-02-16 DIAGNOSIS — F332 Major depressive disorder, recurrent severe without psychotic features: Secondary | ICD-10-CM | POA: Diagnosis present

## 2016-02-16 DIAGNOSIS — Z9114 Patient's other noncompliance with medication regimen: Secondary | ICD-10-CM

## 2016-02-16 DIAGNOSIS — E890 Postprocedural hypothyroidism: Secondary | ICD-10-CM | POA: Diagnosis present

## 2016-02-16 DIAGNOSIS — Z9889 Other specified postprocedural states: Secondary | ICD-10-CM

## 2016-02-16 DIAGNOSIS — F1210 Cannabis abuse, uncomplicated: Secondary | ICD-10-CM | POA: Diagnosis present

## 2016-02-16 DIAGNOSIS — Z818 Family history of other mental and behavioral disorders: Secondary | ICD-10-CM | POA: Diagnosis not present

## 2016-02-16 DIAGNOSIS — Z885 Allergy status to narcotic agent status: Secondary | ICD-10-CM

## 2016-02-16 DIAGNOSIS — I10 Essential (primary) hypertension: Secondary | ICD-10-CM | POA: Diagnosis present

## 2016-02-16 DIAGNOSIS — E039 Hypothyroidism, unspecified: Secondary | ICD-10-CM

## 2016-02-16 DIAGNOSIS — G4700 Insomnia, unspecified: Secondary | ICD-10-CM | POA: Diagnosis present

## 2016-02-16 DIAGNOSIS — R51 Headache: Secondary | ICD-10-CM | POA: Diagnosis present

## 2016-02-16 DIAGNOSIS — J449 Chronic obstructive pulmonary disease, unspecified: Secondary | ICD-10-CM | POA: Diagnosis present

## 2016-02-16 DIAGNOSIS — Z62811 Personal history of psychological abuse in childhood: Secondary | ICD-10-CM | POA: Diagnosis present

## 2016-02-16 DIAGNOSIS — K219 Gastro-esophageal reflux disease without esophagitis: Secondary | ICD-10-CM | POA: Diagnosis present

## 2016-02-16 DIAGNOSIS — Z62810 Personal history of physical and sexual abuse in childhood: Secondary | ICD-10-CM | POA: Diagnosis present

## 2016-02-16 DIAGNOSIS — F17210 Nicotine dependence, cigarettes, uncomplicated: Secondary | ICD-10-CM | POA: Diagnosis present

## 2016-02-16 DIAGNOSIS — F17200 Nicotine dependence, unspecified, uncomplicated: Secondary | ICD-10-CM | POA: Diagnosis present

## 2016-02-16 DIAGNOSIS — F1220 Cannabis dependence, uncomplicated: Secondary | ICD-10-CM | POA: Diagnosis present

## 2016-02-16 MED ORDER — CITALOPRAM HYDROBROMIDE 20 MG PO TABS
40.0000 mg | ORAL_TABLET | Freq: Every day | ORAL | Status: DC
  Administered 2016-02-16: 40 mg via ORAL
  Filled 2016-02-16: qty 2

## 2016-02-16 MED ORDER — LISINOPRIL 5 MG PO TABS
ORAL_TABLET | ORAL | Status: AC
  Administered 2016-02-16: 10 mg via ORAL
  Filled 2016-02-16: qty 2

## 2016-02-16 MED ORDER — PANTOPRAZOLE SODIUM 40 MG PO TBEC
40.0000 mg | DELAYED_RELEASE_TABLET | Freq: Every day | ORAL | Status: DC
  Administered 2016-02-16: 40 mg via ORAL

## 2016-02-16 MED ORDER — ALUM & MAG HYDROXIDE-SIMETH 200-200-20 MG/5ML PO SUSP: 30.0000 mL | ORAL | Status: DC | PRN

## 2016-02-16 MED ORDER — MOMETASONE FURO-FORMOTEROL FUM 200-5 MCG/ACT IN AERO
2.0000 | INHALATION_SPRAY | Freq: Two times a day (BID) | RESPIRATORY_TRACT | Status: DC
  Filled 2016-02-16: qty 8.8

## 2016-02-16 MED ORDER — TRAMADOL HCL 50 MG PO TABS
ORAL_TABLET | ORAL | Status: AC
  Administered 2016-02-16: 50 mg via ORAL
  Filled 2016-02-16: qty 1

## 2016-02-16 MED ORDER — TIZANIDINE HCL 2 MG PO TABS
2.0000 mg | ORAL_TABLET | Freq: Four times a day (QID) | ORAL | Status: DC | PRN
Start: 2016-02-16 — End: 2016-02-16
  Filled 2016-02-16: qty 1

## 2016-02-16 MED ORDER — CITALOPRAM HYDROBROMIDE 20 MG PO TABS
40.0000 mg | ORAL_TABLET | Freq: Every day | ORAL | Status: DC
  Administered 2016-02-17: 40 mg via ORAL
  Filled 2016-02-16: qty 2

## 2016-02-16 MED ORDER — TIZANIDINE HCL 4 MG PO TABS
2.0000 mg | ORAL_TABLET | Freq: Four times a day (QID) | ORAL | Status: DC | PRN
  Administered 2016-02-16 – 2016-02-18 (×5): 2 mg via ORAL
  Filled 2016-02-16 (×5): qty 1

## 2016-02-16 MED ORDER — CALCITRIOL 0.25 MCG PO CAPS
0.2500 ug | ORAL_CAPSULE | Freq: Every day | ORAL | Status: DC
  Administered 2016-02-17 – 2016-02-21 (×5): 0.25 ug via ORAL
  Filled 2016-02-16 (×5): qty 1

## 2016-02-16 MED ORDER — ISOSORBIDE MONONITRATE ER 30 MG PO TB24
30.0000 mg | ORAL_TABLET | Freq: Every day | ORAL | Status: DC
  Administered 2016-02-17 – 2016-02-22 (×5): 30 mg via ORAL
  Filled 2016-02-16 (×5): qty 1

## 2016-02-16 MED ORDER — LISINOPRIL 5 MG PO TABS
10.0000 mg | ORAL_TABLET | Freq: Once | ORAL | Status: AC
  Administered 2016-02-16: 10 mg via ORAL

## 2016-02-16 MED ORDER — MAGNESIUM HYDROXIDE 400 MG/5ML PO SUSP: 30.0000 mL | Freq: Every day | ORAL | Status: DC | PRN

## 2016-02-16 MED ORDER — BUSPIRONE HCL 5 MG PO TABS
ORAL_TABLET | ORAL | Status: AC
  Administered 2016-02-16: 10 mg via ORAL
  Filled 2016-02-16: qty 2

## 2016-02-16 MED ORDER — NITROGLYCERIN 0.4 MG SL SUBL
0.4000 mg | SUBLINGUAL_TABLET | SUBLINGUAL | Status: DC | PRN
  Administered 2016-02-16 – 2016-02-18 (×2): 0.4 mg via SUBLINGUAL
  Filled 2016-02-16 (×2): qty 1

## 2016-02-16 MED ORDER — MOMETASONE FURO-FORMOTEROL FUM 200-5 MCG/ACT IN AERO
2.0000 | INHALATION_SPRAY | Freq: Two times a day (BID) | RESPIRATORY_TRACT | Status: DC
  Administered 2016-02-16 – 2016-02-22 (×11): 2 via RESPIRATORY_TRACT
  Filled 2016-02-16: qty 8.8

## 2016-02-16 MED ORDER — BUSPIRONE HCL 10 MG PO TABS
10.0000 mg | ORAL_TABLET | Freq: Three times a day (TID) | ORAL | Status: DC
  Administered 2016-02-16 – 2016-02-22 (×17): 10 mg via ORAL
  Filled 2016-02-16 (×17): qty 1

## 2016-02-16 MED ORDER — PANTOPRAZOLE SODIUM 40 MG PO TBEC
DELAYED_RELEASE_TABLET | ORAL | Status: AC
  Administered 2016-02-16: 40 mg via ORAL
  Filled 2016-02-16: qty 1

## 2016-02-16 MED ORDER — TIOTROPIUM BROMIDE MONOHYDRATE 18 MCG IN CAPS
18.0000 ug | ORAL_CAPSULE | Freq: Every day | RESPIRATORY_TRACT | Status: DC
  Filled 2016-02-16: qty 5

## 2016-02-16 MED ORDER — ALBUTEROL SULFATE HFA 108 (90 BASE) MCG/ACT IN AERS
2.0000 | INHALATION_SPRAY | RESPIRATORY_TRACT | Status: DC | PRN
  Filled 2016-02-16: qty 6.7

## 2016-02-16 MED ORDER — ATORVASTATIN CALCIUM 20 MG PO TABS
40.0000 mg | ORAL_TABLET | Freq: Every day | ORAL | Status: DC
  Administered 2016-02-16 – 2016-02-21 (×6): 40 mg via ORAL
  Filled 2016-02-16 (×7): qty 2

## 2016-02-16 MED ORDER — ACETAMINOPHEN 325 MG PO TABS
650.0000 mg | ORAL_TABLET | Freq: Four times a day (QID) | ORAL | Status: DC | PRN
  Administered 2016-02-16 – 2016-02-19 (×5): 650 mg via ORAL
  Filled 2016-02-16 (×6): qty 2

## 2016-02-16 MED ORDER — ISOSORBIDE MONONITRATE ER 60 MG PO TB24
30.0000 mg | ORAL_TABLET | Freq: Every day | ORAL | Status: DC
Start: 2016-02-16 — End: 2016-02-16
  Filled 2016-02-16: qty 1

## 2016-02-16 MED ORDER — CALCITRIOL 0.25 MCG PO CAPS: 0.2500 ug | ORAL_CAPSULE | Freq: Every day | ORAL | Status: DC

## 2016-02-16 MED ORDER — TRAMADOL HCL 50 MG PO TABS
50.0000 mg | ORAL_TABLET | Freq: Once | ORAL | Status: AC
  Administered 2016-02-16: 50 mg via ORAL

## 2016-02-16 MED ORDER — NITROGLYCERIN 0.4 MG SL SUBL
0.4000 mg | SUBLINGUAL_TABLET | SUBLINGUAL | Status: DC | PRN
  Filled 2016-02-16: qty 25

## 2016-02-16 MED ORDER — ATORVASTATIN CALCIUM 20 MG PO TABS: 40.0000 mg | ORAL_TABLET | Freq: Every day | ORAL | Status: DC

## 2016-02-16 MED ORDER — BUSPIRONE HCL 10 MG PO TABS
10.0000 mg | ORAL_TABLET | Freq: Three times a day (TID) | ORAL | Status: DC
  Administered 2016-02-16 (×2): 10 mg via ORAL
  Filled 2016-02-16: qty 1

## 2016-02-16 MED ORDER — PANTOPRAZOLE SODIUM 40 MG PO TBEC
40.0000 mg | DELAYED_RELEASE_TABLET | Freq: Every day | ORAL | Status: DC
  Administered 2016-02-17 – 2016-02-22 (×7): 40 mg via ORAL
  Filled 2016-02-16 (×7): qty 1

## 2016-02-16 MED ORDER — LEVOTHYROXINE SODIUM 75 MCG PO TABS
150.0000 ug | ORAL_TABLET | Freq: Every day | ORAL | Status: DC
  Administered 2016-02-17 – 2016-02-22 (×6): 150 ug via ORAL
  Filled 2016-02-16 (×8): qty 2

## 2016-02-16 MED ORDER — TIOTROPIUM BROMIDE MONOHYDRATE 18 MCG IN CAPS
18.0000 ug | ORAL_CAPSULE | Freq: Every day | RESPIRATORY_TRACT | Status: DC
  Administered 2016-02-17 – 2016-02-22 (×6): 18 ug via RESPIRATORY_TRACT
  Filled 2016-02-16: qty 5

## 2016-02-16 MED ORDER — LEVOTHYROXINE SODIUM 75 MCG PO TABS
ORAL_TABLET | ORAL | Status: AC
  Administered 2016-02-16: 150 ug via ORAL
  Filled 2016-02-16: qty 2

## 2016-02-16 MED ORDER — LEVOTHYROXINE SODIUM 75 MCG PO TABS
150.0000 ug | ORAL_TABLET | Freq: Every day | ORAL | Status: DC
  Administered 2016-02-16: 150 ug via ORAL

## 2016-02-16 NOTE — ED Notes (Signed)
Patient in relaxing and watching television. No signs of distress noted. Maintained on 15 minute checks and observation by security camera for safety.

## 2016-02-16 NOTE — ED Notes (Signed)
Patient currently in room resting. No signs of distress noted at this time. Maintained on 15 minute checks and observation by security camera for safety.

## 2016-02-16 NOTE — ED Notes (Signed)
Patient in room resting at this time. Patient states that she feels nauseous and requested a ginger ale which the nurse provided. Patient says that her pain has not gotten better at this time with ultram but refuses request further pain medication. Will continue to monitor. Maintained on 15 minute checks and observation by security camera for safety.

## 2016-02-16 NOTE — ED Notes (Signed)
Patient currently has a visitor and is having a calm conversation. . Patient asked nurse to retrieve black colored cellphone from belongings to give to visitor. Nurse complied due to patient's permission to retrieve phone. Phone no longer a part of patient's belongings. No signs of distress noted. Maintained on 15 minute checks and observation by security camera for safety.

## 2016-02-16 NOTE — ED Notes (Signed)
Pt has a female visitor ,pt was asked if it was ok first also the patient advocate is with pt

## 2016-02-16 NOTE — ED Notes (Signed)
Patient is currently watching television. No signs of distress noted. Maintained on 15 minute checks and observation by security camera for safety.

## 2016-02-16 NOTE — ED Notes (Signed)

## 2016-02-16 NOTE — ED Notes (Signed)
Patient currently denies SI and contracts for safety. She says that she could never actually harm herself but sometimes just wishes "she didn't have to live". Denies HI and AVH. Patient states that she has pain in her right knee rated a 10/10 , knee does not seem to be swollen and is cool and dry to touch. Will inform physician and wait orders for pain management. Patient informed nurse that she wants help to handle dealing with depression and anxiety and did not forward much more information. Patient remains pleasant and cooperative. Maintained on 15 minute checks and observation by security camera for safety.

## 2016-02-16 NOTE — ED Notes (Signed)
Patient resting quietly in room. No noted distress or abnormal behaviors noted. Will continue 15 minute checks and observation by security camera for safety. 

## 2016-02-16 NOTE — ED Notes (Signed)
Patient received breakfast tray 

## 2016-02-16 NOTE — ED Notes (Signed)
Patient currently in room speaking with psychiatrist. No signs of distress noted at this time. Maintained on 15 minute checks and observation by security camera for safety.

## 2016-02-16 NOTE — ED Notes (Signed)
ENVIRONMENTAL ASSESSMENT Potentially harmful objects out of patient reach: Yes Personal belongings secured: Yes Patient dressed in hospital provided attire only: Yes Plastic bags out of patient reach: Yes Patient care equipment (cords, cables, call bells, lines, and drains) shortened, removed, or accounted for: Yes Equipment and supplies removed from bottom of stretcher: Yes Potentially toxic materials out of patient reach: Yes Sharps container removed or out of patient reach: Yes  Patient currently in room sleeping. No signs of distress noted at this time. Maintained on 15 minute checks and observation by security camera for safety.

## 2016-02-16 NOTE — Progress Notes (Signed)
Patient is to be admitted to [facility] by Dr. Weber Cooks.  Attending Physician will be Dr. Bary Leriche.  Patient has been assigned to room 309, by Leigh.  Intake Paper Work has been signed and placed on patient chart.  ER staff is aware of the admission [facility] ER Sect.; Dr. Edd Fabian, ER MD; Sharlee Blew Patient's [employer] Patient Access).   02/16/2016 Con Memos, MS, [place], LPCA Therapeutic Triage Specialist

## 2016-02-16 NOTE — Progress Notes (Signed)
Patient with depressed affect and cooperative behavior with admission interview and assessment. No SI/HI/AVH at this time. Patient is teary and states "I would not hurt myself, but I do not want to go on". Patient states she lives alone and has people that stay with her sometimes with their two dogs. Patient likes to care for the pets. Patient states she has one friend. Denies drug or alcohol use or nicotine use at this time. Denies chronic right knee pain at this time. States she was here at [facility] ago. Safety maintained.

## 2016-02-16 NOTE — Progress Notes (Signed)
Patient c/o CP. States it feels like gas says it hurts real bad. Pain scale 9/10. Reports taking nitro tab 2 days ago at home. V/s 150/72, HR 57, MAP 91. Nitro SL tab 0.4mg  given as ordered PRN at 2041 for CP. No s/s acute resp distress noted. Skin warm and dry to touch. Will continue to monitor.

## 2016-02-16 NOTE — Tx Team (Signed)
Initial Interdisciplinary Treatment Plan   PATIENT STRESSORS: Medication change or noncompliance Substance abuse   PATIENT STRENGTHS: Capable of independent living Communication skills   PROBLEM LIST: Problem List/Patient Goals Date to be addressed Date deferred Reason deferred Estimated date of resolution  Depression  4/18           Suicidall Ideas  4/18                                          DISCHARGE CRITERIA:  Improved stabilization in mood, thinking, and/or behavior Need for constant or close observation no longer present Verbal commitment to aftercare and medication compliance  PRELIMINARY DISCHARGE PLAN: Outpatient therapy Return to previous living arrangement  PATIENT/FAMIILY INVOLVEMENT: This treatment plan has been presented to and reviewed with the patient, Maria Warren, and/or family member, .  The patient and family have been given the opportunity to ask questions and make suggestions.  Raul Del 02/16/2016, 5:04 PM

## 2016-02-16 NOTE — Consult Note (Signed)
[place] [facility] Consult   Reason for Consult:  Consult for this 60 year old woman with a history of recurrent severe depression who came into the emergency room and is now making suicidal statements. Referring Physician:  Edd Fabian Patient Identification: Maria Warren MRN:  [id number] Principal Diagnosis: Severe recurrent major depression without psychotic features [facility]) Diagnosis:   Patient Active Problem List   Diagnosis Date Noted  . Severe recurrent major depression without psychotic features ([place]) [F33.2] 02/16/2016  . Cannabis abuse [F12.10] 02/16/2016  . Hypertension [I10] 02/16/2016  . Thyroid disease [E07.9] 02/16/2016  . Coronary artery disease [I25.10] 02/16/2016  . Suicidal ideation [R45.851] 02/16/2016    Total Time spent with patient: 1 hour  Subjective:   Maria Warren is a 60 y.o. female patient admitted with "I guess I'm as good as can be expected".  HPI:  Patient interviewed. Chart reviewed including old notes and old psychiatric evaluation labs reviewed. 60 year old woman says that she came into the emergency room because her knee was hurting and because she can't feel in her body. One she got here it became clear that she is very depressed. She tells me that for at least the last month her mood is been sad and depressed all the time. She stays in bed almost all the time except when she is taking care of her dog. Sleep is erratic throughout the day and night. Appetite has been diminished. She has been drinking alcohol on a regular basis a few beers at night although she says she hasn't had any the last couple nights. Smokes marijuana couple times a week probably. She says that she is still on her medication but then clarifies that a week ago she had a week without it. Not really clear to me whether that mean she's been back on it yet or not. She is not having any psychotic symptoms. She does say that she's having active thoughts of wishing that she were dead. She  feels hopeless to get any change. She has no transportation and no way to get to a doctor where she is right now. Seems to be very limited in her ability to take care of herself.  Medical history: Patient has a history of high blood pressure and thyroid disease and a history of coronary artery disease. She is on medication but sounds like she is erratic and following up with that.  Substance abuse history: History of drinking and marijuana abuse. Not clear how much they drive any of her symptoms. Has not engaged in substance abuse treatment previously no history of seizures or DTs.  Social history: [place] and says that she has people who are living there with her a man in the man's girlfriend. They are apparently not relatives but just people that she somehow met. She seems very ambivalent about them saying on the one hand that they take her to get groceries sometimes but they also leave her alone for long periods and have now been away for a couple weeks and she feels like she wishes that she could throw them out.  Past Psychiatric History: Patient has had psychiatric hospitalizations in the past. Has had some suicide attempts in the past. Long history of recurrent depression no history of manic symptoms no psychosis. Is supposed to be taking citalopram and BuSpar and hydralazine. Doesn't get any outpatient therapy or mental health treatment right now. Years since she was in the hospital.  Risk to Self: Suicidal  Ideation: Yes-Currently Present Suicidal Intent: No Is patient at risk for suicide?: No Suicidal Plan?: No Access to Means: No What has been your use of drugs/alcohol within the last 12 months?: use of beer How many times?: 0 Other Self Harm Risks: denied Triggers for Past Attempts: None known Intentional Self Injurious Behavior: None Risk to Others: Homicidal Ideation: No Thoughts of Harm to Others: No Current Homicidal Intent: No Current  Homicidal Plan: No Access to Homicidal Means: No Identified Victim: none identified History of harm to others?: No Assessment of Violence: None Noted Violent Behavior Description: denied Does patient have access to weapons?: No Criminal Charges Pending?: No Does patient have a court date: No Prior Inpatient Therapy: Prior Inpatient Therapy: Yes Prior Therapy Dates: 2015 Prior Therapy Facilty/Provider(s): [facility] Reason for Treatment: Depression Prior Outpatient Therapy: Prior Outpatient Therapy: Yes Prior Therapy Dates: 2010 Prior Therapy Facilty/Provider(s): [facility] Reason for Treatment: Depression Does patient have an ACCT team?: No Does patient have Intensive In-House Services?  : No Does patient have [facility] services? : No Does patient have P4CC services?: No  Past Medical History:  Past Medical History  Diagnosis Date  . Hypertension   . Thyroid disease   . Coronary artery disease     Past Surgical History  Procedure Laterality Date  . Total thyroidectomy    . Cesarean section    . Esophagogastroduodenoscopy (egd) with propofol N/A 11/24/2015    Procedure: ESOPHAGOGASTRODUODENOSCOPY (EGD) WITH PROPOFOL;  Surgeon: Lollie Sails, MD;  Location: [facility] ENDOSCOPY;  Service: Endoscopy;  Laterality: N/A;  . Colonoscopy N/A 11/24/2015    Procedure: COLONOSCOPY;  Surgeon: Lollie Sails, MD;  Location: [facility] ENDOSCOPY;  Service: Endoscopy;  Laterality: N/A;   Family History: No family history on file. Family Psychiatric  History: Patient is not aware of any family history of mental health problems or substance abuse problems. Social History:  History  Alcohol Use  . Yes     History  Drug Use No    Social History   Social History  . Marital Status: Widowed    Spouse Name: N/A  . Number of Children: N/A  . Years of Education: N/A   Social History Main Topics  . Smoking status: Current Every Day Smoker -- 1.00 packs/day for 25 years    Types: Cigarettes  .  Smokeless tobacco: Never Used  . Alcohol Use: Yes  . Drug Use: No  . Sexual Activity: Not Asked   Other Topics Concern  . None   Social History Narrative   Additional Social History:    Allergies:   Allergies  Allergen Reactions  . Lyrica [Pregabalin] Nausea Only  . Topamax [Topiramate] Nausea Only  . Zoloft [Sertraline Hcl] Nausea Only  . Bupropion Nausea Only    Light headed    Labs:  Results for orders placed or performed during the hospital encounter of 02/15/16 (from the past 48 hour(s))  CBC with Differential     Status: Abnormal   Collection Time: 02/15/16  5:22 PM  Result Value Ref Range   WBC 10.9 3.6 - 11.0 K/uL   RBC 3.70 (L) 3.80 - 5.20 MIL/uL   Hemoglobin 11.0 (L) 12.0 - 16.0 g/dL   HCT 33.0 (L) 35.0 - 47.0 %   MCV 89.2 80.0 - 100.0 fL   MCH 29.8 26.0 - 34.0 pg   MCHC 33.4 32.0 - 36.0 g/dL   RDW 15.6 (H) 11.5 - 14.5 %   Platelets 247 150 - 440 K/uL   Neutrophils  Relative % 66 %   Neutro Abs 7.3 (H) 1.4 - 6.5 K/uL   Lymphocytes Relative 23 %   Lymphs Abs 2.5 1.0 - 3.6 K/uL   Monocytes Relative 7 %   Monocytes Absolute 0.7 0.2 - 0.9 K/uL   Eosinophils Relative 3 %   Eosinophils Absolute 0.3 0 - 0.7 K/uL   Basophils Relative 1 %   Basophils Absolute 0.1 0 - 0.1 K/uL  Comprehensive metabolic panel     Status: Abnormal   Collection Time: 02/15/16  5:22 PM  Result Value Ref Range   Sodium 138 135 - 145 mmol/L   Potassium 3.0 (L) 3.5 - 5.1 mmol/L   Chloride 104 101 - 111 mmol/L   CO2 27 22 - 32 mmol/L   Glucose, Bld 86 65 - 99 mg/dL   BUN 9 6 - 20 mg/dL   Creatinine, Ser 0.70 0.44 - 1.00 mg/dL   Calcium 7.9 (L) 8.9 - 10.3 mg/dL   Total Protein 7.5 6.5 - 8.1 g/dL   Albumin 4.0 3.5 - 5.0 g/dL   AST 23 15 - 41 U/L   ALT 14 14 - 54 U/L   Alkaline Phosphatase 59 38 - 126 U/L   Total Bilirubin 0.4 0.3 - 1.2 mg/dL   GFR calc non Af Amer >60 >60 mL/min   GFR calc Af Amer >60 >60 mL/min    Comment: (NOTE) The eGFR has been calculated using the CKD EPI  equation. This calculation has not been validated in all clinical situations. eGFR's persistently <60 mL/min signify possible Chronic Kidney Disease.    Anion gap 7 5 - 15  Ethanol (ETOH)     Status: None   Collection Time: 02/15/16  5:22 PM  Result Value Ref Range   Alcohol, Ethyl (B) <5 <5 mg/dL    Comment:        LOWEST DETECTABLE LIMIT FOR SERUM ALCOHOL IS 5 mg/dL FOR MEDICAL PURPOSES ONLY   Salicylate level     Status: None   Collection Time: 02/15/16  5:22 PM  Result Value Ref Range   Salicylate Lvl <6.7 2.8 - 30.0 mg/dL  Acetaminophen level     Status: Abnormal   Collection Time: 02/15/16  5:22 PM  Result Value Ref Range   Acetaminophen (Tylenol), Serum <10 (L) 10 - 30 ug/mL    Comment:        THERAPEUTIC CONCENTRATIONS VARY SIGNIFICANTLY. A RANGE OF 10-30 ug/mL MAY BE AN EFFECTIVE CONCENTRATION FOR MANY PATIENTS. HOWEVER, SOME ARE BEST TREATED AT CONCENTRATIONS OUTSIDE THIS RANGE. ACETAMINOPHEN CONCENTRATIONS >150 ug/mL AT 4 HOURS AFTER INGESTION AND >50 ug/mL AT 12 HOURS AFTER INGESTION ARE OFTEN ASSOCIATED WITH TOXIC REACTIONS.   Urine Drug Screen, Qualitative ([facility] only)     Status: Abnormal   Collection Time: 02/15/16  8:29 PM  Result Value Ref Range   Tricyclic, Ur Screen NONE DETECTED NONE DETECTED   Amphetamines, Ur Screen NONE DETECTED NONE DETECTED   MDMA (Ecstasy)Ur Screen NONE DETECTED NONE DETECTED   Cocaine Metabolite,Ur [place] NONE DETECTED NONE DETECTED   Opiate, Ur Screen NONE DETECTED NONE DETECTED   Phencyclidine (PCP) Ur S NONE DETECTED NONE DETECTED   Cannabinoid 50 Ng, Ur [place] POSITIVE (A) NONE DETECTED   Barbiturates, Ur Screen NONE DETECTED NONE DETECTED   Benzodiazepine, Ur Scrn POSITIVE (A) NONE DETECTED   Methadone Scn, Ur NONE DETECTED NONE DETECTED    Comment: (NOTE) 209  Tricyclics, urine  Cutoff 1000 ng/mL 200  Amphetamines, urine             Cutoff 1000 ng/mL 300  MDMA (Ecstasy), urine           Cutoff 500 ng/mL 400   Cocaine Metabolite, urine       Cutoff 300 ng/mL 500  Opiate, urine                   Cutoff 300 ng/mL 600  Phencyclidine (PCP), urine      Cutoff 25 ng/mL 700  Cannabinoid, urine              Cutoff 50 ng/mL 800  Barbiturates, urine             Cutoff 200 ng/mL 900  Benzodiazepine, urine           Cutoff 200 ng/mL 1000 Methadone, urine                Cutoff 300 ng/mL 1100 1200 The urine drug screen provides only a preliminary, unconfirmed 1300 analytical test result and should not be used for non-medical 1400 purposes. Clinical consideration and professional judgment should 1500 be applied to any positive drug screen result due to possible 1600 interfering substances. A more specific alternate chemical method 1700 must be used in order to obtain a confirmed analytical result.  1800 Gas chromato graphy / mass spectrometry (GC/MS) is the preferred 1900 confirmatory method.     Current Facility-Administered Medications  Medication Dose Route Frequency Provider Last Rate Last Dose  . albuterol (PROVENTIL HFA;VENTOLIN HFA) 108 (90 Base) MCG/ACT inhaler 2 puff  2 puff Inhalation Q4H PRN Loney Hering, MD      . atorvastatin (LIPITOR) tablet 40 mg  40 mg Oral q1800 Loney Hering, MD      . busPIRone (BUSPAR) tablet 10 mg  10 mg Oral TID Loney Hering, MD   10 mg at 02/16/16 0949  . calcitRIOL (ROCALTROL) capsule 0.25 mcg  0.25 mcg Oral Daily Loney Hering, MD   0.25 mcg at 02/16/16 0954  . citalopram (CELEXA) tablet 40 mg  40 mg Oral Daily Gonzella Lex, MD      . isosorbide mononitrate (IMDUR) 24 hr tablet 30 mg  30 mg Oral Daily Loney Hering, MD   30 mg at 02/16/16 0948  . levothyroxine (SYNTHROID, LEVOTHROID) tablet 150 mcg  150 mcg Oral QAC breakfast Loney Hering, MD   150 mcg at 02/16/16 0807  . mometasone-formoterol (DULERA) 200-5 MCG/ACT inhaler 2 puff  2 puff Inhalation BID Loney Hering, MD   2 puff at 02/16/16 0857  . nitroGLYCERIN (NITROSTAT) SL  tablet 0.4 mg  0.4 mg Sublingual Q5 min PRN Loney Hering, MD      . pantoprazole (PROTONIX) EC tablet 40 mg  40 mg Oral Daily Loney Hering, MD   40 mg at 02/16/16 0948  . tiotropium (SPIRIVA) inhalation capsule 18 mcg  18 mcg Inhalation Daily Loney Hering, MD   18 mcg at 02/16/16 0857  . tiZANidine (ZANAFLEX) tablet 2 mg  2 mg Oral Q6H PRN Loney Hering, MD       Current Outpatient Prescriptions  Medication Sig Dispense Refill  . nitroGLYCERIN (NITROSTAT) 0.4 MG SL tablet Place 0.4 mg under the tongue every 5 (five) minutes as needed for chest pain.    Marland Kitchen albuterol (PROVENTIL HFA;VENTOLIN HFA) 108 (90 BASE) MCG/ACT inhaler Inhale 2 puffs into the lungs every  4 (four) hours as needed for wheezing or shortness of breath.    Marland Kitchen aspirin EC 81 MG tablet Take 162 mg by mouth daily.    Marland Kitchen atorvastatin (LIPITOR) 40 MG tablet Take 40 mg by mouth daily.    . busPIRone (BUSPAR) 10 MG tablet Take 10 mg by mouth 3 (three) times daily.    . calcitRIOL (ROCALTROL) 0.25 MCG capsule Take 0.25 mcg by mouth daily.    . Fluticasone-Salmeterol (ADVAIR) 250-50 MCG/DOSE AEPB Inhale 1 puff into the lungs 2 (two) times daily.    . hydrOXYzine (ATARAX/VISTARIL) 25 MG tablet Take 25 mg by mouth 3 (three) times daily as needed.    . isosorbide mononitrate (IMDUR) 30 MG 24 hr tablet Take 30 mg by mouth daily.    Marland Kitchen levothyroxine (SYNTHROID, LEVOTHROID) 150 MCG tablet Take 150 mcg by mouth daily before breakfast.    . lisinopril-hydrochlorothiazide (PRINZIDE,ZESTORETIC) 10-12.5 MG per tablet Take 1 tablet by mouth daily.    Marland Kitchen omeprazole (PRILOSEC) 20 MG capsule Take 20 mg by mouth daily.    Marland Kitchen tiotropium (SPIRIVA) 18 MCG inhalation capsule Place 18 mcg into inhaler and inhale daily.    Marland Kitchen tiZANidine (ZANAFLEX) 2 MG tablet Take 2 mg by mouth every 6 (six) hours as needed for muscle spasms.    . traMADol (ULTRAM) 50 MG tablet Take 1 tablet (50 mg total) by mouth every 6 (six) hours as needed. 20 tablet 0     Musculoskeletal: Strength & Muscle Tone: within normal limits Gait & Station: normal Patient leans: N/A  Psychiatric Specialty Exam: Review of Systems  HENT: Negative.   Eyes: Negative.   Respiratory: Negative.   Cardiovascular: Negative.   Gastrointestinal: Negative.   Musculoskeletal: Positive for joint pain.  Skin: Negative.   Neurological: Positive for weakness.  Psychiatric/Behavioral: Positive for depression, suicidal ideas, memory loss and substance abuse. Negative for hallucinations. The patient is nervous/anxious and has insomnia.     Blood pressure 136/86, pulse 64, temperature 98.1 F (36.7 C), temperature source Oral, resp. rate 18, height 5' 6"  (1.676 m), weight 83.008 kg (183 lb), SpO2 95 %.Body mass index is 29.55 kg/(m^2).  General Appearance: Disheveled  Eye Contact::  Minimal  Speech:  Slow  Volume:  Decreased  Mood:  Depressed and Dysphoric  Affect:  Constricted and Depressed  Thought Process:  Goal Directed  Orientation:  Full (Time, Place, and Person)  Thought Content:  Rumination and Hopelessness  Suicidal Thoughts:  Yes.  without intent/plan  Homicidal Thoughts:  No  Memory:  Immediate;   Fair Recent;   Fair Remote;   Fair  Judgement:  Fair  Insight:  Fair  Psychomotor Activity:  Decreased  Concentration:  Fair  Recall:  [employer] of Knowledge:Fair  Language: Fair  Akathisia:  No  Handed:  Right  AIMS (if indicated):     Assets:  Desire for Improvement Housing  ADL's:  Intact  Cognition: WNL  Sleep:      Treatment Plan Summary: Daily contact with patient to assess and evaluate symptoms and progress in treatment, Medication management and Plan 60 year old woman with major depression severe without psychotic features. She has suicidal thoughts and although she does not have any active plan right now the chances of her getting better outside the hospital seem to be low. She has no transporter way to get to see a psychiatrist. She is only  occasionally compliant with her medicine and is drinking and using marijuana. Safest thing appears to be to admit  her to the hospital. She is agreeable to the plan. Continue all of her current medicine for her medical problems and psychiatric problems at this time. Continuous observation. Treatment team can reevaluate for appropriate treatment changes if needed. He and supportive counseling completed.  Disposition: Recommend psychiatric Inpatient admission when medically cleared. Supportive therapy provided about ongoing stressors.  Alethia Berthold, MD 02/16/2016 12:38 PM

## 2016-02-16 NOTE — ED Notes (Signed)
Patient currently denies SI/HI/AVH. Continues to endorse pain in right knee. Patient transferred to inpatient unit. All belongings sent with patient to unit. Patient agreeable with plan to be admitted. Report given to [facility].

## 2016-02-16 NOTE — Progress Notes (Signed)
C/o Left knee pain. Pain scale 10/10. Tylenol 650 mg and Zanaflex 2mg  po given PRN for pain.

## 2016-02-16 NOTE — ED Provider Notes (Signed)
-----------------------------------------   8:33 AM on 02/16/2016 -----------------------------------------   Blood pressure 136/86, pulse 64, temperature 98.1 F (36.7 C), temperature source Oral, resp. rate 18, height 5\' 6"  (1.676 m), weight 183 lb (83.008 kg), SpO2 95 %.  The patient had no acute events since last update.  Calm and cooperative at this time.   Patient to be evaluated by psych     Loney Hering, MD 02/16/16 [phone]

## 2016-02-17 ENCOUNTER — Encounter: Payer: Self-pay | Admitting: Psychiatry

## 2016-02-17 DIAGNOSIS — F332 Major depressive disorder, recurrent severe without psychotic features: Principal | ICD-10-CM

## 2016-02-17 DIAGNOSIS — F17200 Nicotine dependence, unspecified, uncomplicated: Secondary | ICD-10-CM | POA: Diagnosis present

## 2016-02-17 MED ORDER — VENLAFAXINE HCL ER 75 MG PO CP24
150.0000 mg | ORAL_CAPSULE | Freq: Once | ORAL | Status: AC
  Administered 2016-02-17: 150 mg via ORAL
  Filled 2016-02-17: qty 2

## 2016-02-17 MED ORDER — LISINOPRIL 10 MG PO TABS
10.0000 mg | ORAL_TABLET | Freq: Every day | ORAL | Status: DC
  Administered 2016-02-17 – 2016-02-22 (×5): 10 mg via ORAL
  Filled 2016-02-17 (×5): qty 1

## 2016-02-17 MED ORDER — TRAZODONE HCL 100 MG PO TABS
100.0000 mg | ORAL_TABLET | Freq: Every day | ORAL | Status: DC
  Administered 2016-02-17 – 2016-02-18 (×2): 100 mg via ORAL
  Filled 2016-02-17 (×2): qty 1

## 2016-02-17 MED ORDER — DICLOFENAC SODIUM 1 % TD GEL
4.0000 g | Freq: Four times a day (QID) | TRANSDERMAL | Status: DC
  Administered 2016-02-17: 4 g via TOPICAL
  Filled 2016-02-17: qty 100

## 2016-02-17 MED ORDER — VENLAFAXINE HCL ER 75 MG PO CP24
150.0000 mg | ORAL_CAPSULE | Freq: Every day | ORAL | Status: DC
  Administered 2016-02-18 – 2016-02-22 (×6): 150 mg via ORAL
  Filled 2016-02-17 (×5): qty 2

## 2016-02-17 MED ORDER — HYDROCHLOROTHIAZIDE 12.5 MG PO CAPS
12.5000 mg | ORAL_CAPSULE | Freq: Every day | ORAL | Status: DC
  Administered 2016-02-17 – 2016-02-22 (×5): 12.5 mg via ORAL
  Filled 2016-02-17 (×5): qty 1

## 2016-02-17 MED ORDER — PRAZOSIN HCL 1 MG PO CAPS
2.0000 mg | ORAL_CAPSULE | Freq: Every day | ORAL | Status: DC
  Administered 2016-02-17 – 2016-02-20 (×4): 2 mg via ORAL
  Filled 2016-02-17 (×4): qty 2

## 2016-02-17 MED ORDER — ASPIRIN 81 MG PO CHEW
81.0000 mg | CHEWABLE_TABLET | Freq: Every day | ORAL | Status: DC
  Administered 2016-02-17 – 2016-02-22 (×6): 81 mg via ORAL
  Filled 2016-02-17 (×6): qty 1

## 2016-02-17 NOTE — Progress Notes (Signed)
Patient lying in bed with eyes open. RR even and unlabored. States she's feeling much better. B/p 114/49, HR 54 at 2330. No c/o pain/distress noted. Will continue to monitor for safety and behavior.

## 2016-02-17 NOTE — Plan of Care (Signed)
Problem: Diagnosis: Increased Risk For Suicide Attempt Goal: LTG-Patient Will Report Improved Mood and Deny Suicidal LTG (by discharge) Patient will report improved mood and deny suicidal ideation.  Outcome: Not Progressing Verbalized SI at times.
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
## 2016-02-17 NOTE — Progress Notes (Signed)
Patient is pleasant & cooperative on approach.Verbalized that she is having suicidal thoughts at times & rated her depression 9/10.Stated that she feels hopeless in life.She does not have any contact with her son for 8 yrs.Appropriate with staff & peers.Attended groups.Compliant with medications.

## 2016-02-17 NOTE — B[facility] Group Notes (Signed)
[facility] Group Notes:  (Nursing/MHT/Case Management/Adjunct)  Date:  02/17/2016  Time:  10:07 PM  Type of Therapy:  Group Therapy  Participation Level:  Active  Participation Quality:  Appropriate  Affect:  Appropriate  Cognitive:  Appropriate  Insight:  Appropriate  Engagement in Group:  Engaged  Modes of Intervention:  Discussion  Summary of Progress/Problems:  Kandis Fantasia 02/17/2016, 10:07 PM

## 2016-02-17 NOTE — [facility] Suicide Risk Assessment (Signed)
[facility] Admission Suicide Risk Assessment   Nursing information obtained from:    Demographic factors:    Current Mental Status:    Loss Factors:    Historical Factors:    Risk Reduction Factors:     Total Time spent with patient: 1 hour Principal Problem: Major depressive disorder, recurrent severe without psychotic features ([place]) Diagnosis:   Patient Active Problem List   Diagnosis Date Noted  . Tobacco use disorder [F17.200] 02/17/2016  . Major depressive disorder, recurrent severe without psychotic features ([place]) [F33.2] 02/16/2016  . Cannabis use disorder, moderate, dependence ([place]) [F12.20] 02/16/2016  . Hypertension [I10] 02/16/2016  . Hypothyroidism [E03.9] 02/16/2016  . Coronary artery disease [I25.10] 02/16/2016  . Suicidal ideation [R45.851] 02/16/2016   Subjective Data: Depression, suicidal ideation, anxiety, chronic pain.  Continued Clinical Symptoms:  Alcohol Use Disorder Identification Test Final Score (AUDIT): 11 The "Alcohol Use Disorders Identification Test", Guidelines for Use in Primary Care, Second Edition.  World Pharmacologist [facility]). Score between 0-7:  no or low risk or alcohol related problems. Score between 8-15:  moderate risk of alcohol related problems. Score between 16-19:  high risk of alcohol related problems. Score 20 or above:  warrants further diagnostic evaluation for alcohol dependence and treatment.   CLINICAL FACTORS:   Severe Anxiety and/or Agitation Depression:   Comorbid alcohol abuse/dependence Impulsivity Alcohol/Substance Abuse/Dependencies   Musculoskeletal: Strength & Muscle Tone: within normal limits Gait & Station: normal Patient leans: N/A  Psychiatric Specialty Exam: Review of Systems  Musculoskeletal: Positive for joint pain and neck pain.  Psychiatric/Behavioral: Positive for depression, suicidal ideas and substance abuse.  All other systems reviewed and are negative.   Blood pressure 134/83, pulse 65,  temperature 98.3 F (36.8 C), temperature source Oral, resp. rate 18, height 5\' 6"  (1.676 m), weight 83.008 kg (183 lb), SpO2 96 %.Body mass index is 29.55 kg/(m^2).  General Appearance: Casual  Eye Contact::  Fair  Speech:  Clear and Coherent  Volume:  Normal  Mood:  Anxious and Depressed  Affect:  Blunt  Thought Process:  Goal Directed  Orientation:  Full (Time, Place, and Person)  Thought Content:  WDL  Suicidal Thoughts:  Yes.  with intent/plan  Homicidal Thoughts:  No  Memory:  Immediate;   Fair Recent;   Fair Remote;   Fair  Judgement:  Poor  Insight:  Shallow  Psychomotor Activity:  Normal  Concentration:  Fair  Recall:  [place]: Fair  Akathisia:  No  Handed:  Right  AIMS (if indicated):     Assets:  Communication Skills Desire for Improvement Financial Resources/Insurance Housing Physical Health Resilience Social Support  Sleep:  Number of Hours: 7.15  Cognition: WNL  ADL's:  Intact    COGNITIVE FEATURES THAT CONTRIBUTE TO RISK:  None    SUICIDE RISK:   Moderate:  Frequent suicidal ideation with limited intensity, and duration, some specificity in terms of plans, no associated intent, good self-control, limited dysphoria/symptomatology, some risk factors present, and identifiable protective factors, including available and accessible social support.  PLAN OF CARE: Hospital admission, medication management, substance abuse counseling, discharge planning.  Ms. Silvernale is a 60 year old female with history of depression and anxiety admitted for worsening of depression and suicidal ideation in the context of medication noncompliance and substance use.  1. Suicidal ideation. The patient is able to contract for safety in the hospital.  2. Mood. The patient has been maintained on Celexa. It is unclear if she is been  compliant. In any case she feels that is not being helpful. We will start Effexor 150 mg daily.  3. Anxiety. The patient  believes that she needs Klonopin or Valium for anxiety. She has been taking BuSpar at home. We'll offer Vistaril.  4. Knee and neck pain. This was her initial complaint in the emergency room. We'll give Voltaren gel.  5. Smoking. Nicotine patch is available.  6. Benzodiazepine abuse. The patient is positive for benzodiazepines on admission. They are not prescribed. We will monitor for symptoms of withdrawal.  7. Cannabis abuse. The patient minimizes her problems and declines treatment.   8. Disposition. She will be discharged back to home with her parents. She wants to follow up with Dr. Rosine Door.  I certify that inpatient services furnished can reasonably be expected to improve the patient's condition.   Orson Slick, MD 02/17/2016, 1:36 PM

## 2016-02-17 NOTE — [facility] Group Notes (Signed)
[facility] Group Notes:  (Nursing/MHT/Case Management/Adjunct)  Date:  02/17/2016  Time:  5:12 PM  Type of Therapy:  Psychoeducational Skills  Participation Level:  Active  Participation Quality:  Appropriate  Affect:  Appropriate  Cognitive:  Appropriate  Insight:  Appropriate  Engagement in Group:  Engaged  Modes of Intervention:  Discussion  Summary of Progress/Problems:  Drake Leach 02/17/2016, 5:12 PM

## 2016-02-17 NOTE — Progress Notes (Signed)
Recreation Therapy Notes  Date: 04.19.17 Time: 1:00 pm Location: Craft Room  Group Topic: Self-esteem  Goal Area(s) Addresses:  Patient will identify at least one positive trait about self. Patient will identify at least one healthy coping skill.  Behavioral Response: Attentive  Intervention: All About Me  Activity: Patients were instructed to make an All About Me pamphlet with positive traits about themselves, healthy coping skills, and their healthy support system.  Education: LRT educated patients on ways they can increase their self-esteem.  Education Outcome: Acknowledges education/In group clarification offered   Clinical Observations/Feedback: Patient completed activity by writing positive traits, healthy coping skills, and her support system. Patient did not contribute to group discussion.  Leonette Monarch, LRT/CTRS 02/17/2016 2:38 PM

## 2016-02-17 NOTE — [facility] Group Notes (Signed)
[facility] LCSW Group Therapy  02/17/2016 3:17 PM  Type of Therapy:  Group Therapy  Participation Level:  Active   Participation Quality:  Attentive  Affect:  Depressed  Cognitive:  Alert   Insight:  Improving   Engagement in Therapy:  Improving   Modes of Intervention:  Discussion, Education, Socialization and Support  Summary of Progress/Problems: Self esteem: Patients discussed self esteem and how it impacts them. They discussed what aspects in their lives has influenced their self esteem. They were challenged to identify changes that are needed in order to improve self esteem.  Pt attended group and stayed the entire time. She discussed people telling her she is worthless and she will never amount to anything. She states this belief impacts her relationships. She reports she allows people to take advantage of her.   [place] MSW, [place]  02/17/2016, 3:17 PM

## 2016-02-17 NOTE — Progress Notes (Signed)
Recreation Therapy Notes  INPATIENT RECREATION THERAPY ASSESSMENT  Patient Details Name: Maria Warren MRN: [id number] DOB: 11-11-1955 Today's Date: 02/17/2016  Patient Stressors: Family, Friends, Other (Comment) (Not a good relationship with family - family doesn't talk to each other after the parents died - patient tries to call them to check on them, but they do not call to check on her; lack of supportive friends; life in general)  Coping Skills:   Isolate, Substance Abuse, Avoidance, Exercise, Art/Dance, Talking, Music, Other (Comment) (Watch TV, sit on porch)  Personal Challenges: Anger, Communication, Concentration, Decision-Making, Expressing Yourself, Problem-Solving, Self-Esteem/Confidence, Stress Management, Trusting Others  Leisure Interests (2+):  Individual - Other (Comment) (Going to friend's pool, play with dogs)  Awareness of Community Resources:  No  Community Resources:     Current Use:    If no, Barriers?:    Patient Strengths:  Still alive  Patient Identified Areas of Improvement:  Trying to say no, build self-esteem up  Current Recreation Participation:  Walking  Patient Goal for Hospitalization:  Try to get better  [place] of Residence:  [place] of Residence:  [place]   Current SI (including self-harm):  Yes ("Sometimes")  Current HI:  Yes ("Sometimes")  Consent to Intern Participation: N/A   Leonette Monarch, LRT/CTRS 02/17/2016, 2:50 PM
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
## 2016-02-18 MED ORDER — SUMATRIPTAN SUCCINATE 50 MG PO TABS
50.0000 mg | ORAL_TABLET | Freq: Once | ORAL | Status: AC
  Administered 2016-02-18: 50 mg via ORAL
  Filled 2016-02-18: qty 1

## 2016-02-18 MED ORDER — DICLOFENAC SODIUM 1 % TD GEL
4.0000 g | Freq: Four times a day (QID) | TRANSDERMAL | Status: DC | PRN
  Filled 2016-02-18: qty 100

## 2016-02-18 MED ORDER — SUMATRIPTAN SUCCINATE 50 MG PO TABS
50.0000 mg | ORAL_TABLET | ORAL | Status: DC | PRN
  Filled 2016-02-18: qty 1

## 2016-02-18 NOTE — Progress Notes (Signed)
Recreation Therapy Notes  Date: 04.20.17 Time: 1:00 pm Location: Craft Room  Group Topic: Leisure Education  Goal Area(s) Addresses:  Patient will identify activities for each letter of the alphabet. Patient will verbalize ability to integrate positive leisure into life post d/c. Patient will verbalize ability to use leisure as a Technical sales engineer.  Behavioral Response: Attentive, Interactive  Intervention: Leisure Alphabet  Activity: Patients were given a Leisure Air traffic controller and instructed to list healthy leisure activities for each letter of the alphabet.  Education: LRT educated patients on what they need to participate in leisure activities.  Education Outcome: Acknowledges education/In group clarification offered  Clinical Observations/Feedback: Patient completed activity by writing healthy leisure activities. Patient contributed to group discussion by stating some of the healthy leisure activities she listed. Patient did state that if she could have used words related to drugs she would have completed the sheet with flying colors.  Leonette Monarch, LRT/CTRS 02/18/2016 3:03 PM

## 2016-02-18 NOTE — [facility] Suicide Risk Assessment (Signed)
[facility] INPATIENT:  Family/Significant Other Suicide Prevention Education  Suicide Prevention Education:  Patient Refusal for Family/Significant Other Suicide Prevention Education: The patient Maria Warren has refused to provide written consent for family/significant other to be provided Family/Significant Other Suicide Prevention Education during admission and/or prior to discharge.  Physician notified.  Keene Breath, MSW, LCSW 02/18/2016, 9:59 AM

## 2016-02-18 NOTE — [facility] Group Notes (Signed)
[facility] Group Notes:  (Nursing/MHT/Case Management/Adjunct)  Date:  02/18/2016  Time:  6:27 PM  Type of Therapy:  Music Therapy  Participation Level:  Active  Participation Quality:  Appropriate and Attentive  Affect:  Appropriate  Cognitive:  Alert, Appropriate and Oriented  Insight:  Appropriate  Engagement in Group:  Engaged  Modes of Intervention:  Activity and Socialization  Summary of Progress/Problems:  Maria Warren De'Chelle [name] 02/18/2016, 6:27 PM

## 2016-02-18 NOTE — Plan of Care (Signed)
Problem: Diagnosis: Increased Risk For Suicide Attempt Goal: STG-Patient Will Comply With Medication Regime Outcome: Progressing Pt compliant with medication regimen     

## 2016-02-18 NOTE — Progress Notes (Signed)
[facility] MD Progress Note  02/18/2016 3:34 PM Maria Warren  MRN:  [id number]  Subjective:  Ms. Carvajal still feels depressed, anxious and suicidal. She developed a major headache this am after given Effexor that resolved with Imitrex. She is now anxious about taking medications. She has been trying to participate in programming. Sleep and appetite are fair.   Principal Problem: Major depressive disorder, recurrent severe without psychotic features ([place]) Diagnosis:   Patient Active Problem List   Diagnosis Date Noted  . Tobacco use disorder [F17.200] 02/17/2016  . Major depressive disorder, recurrent severe without psychotic features ([place]) [F33.2] 02/16/2016  . Cannabis use disorder, moderate, dependence ([place]) [F12.20] 02/16/2016  . Hypertension [I10] 02/16/2016  . Hypothyroidism [E03.9] 02/16/2016  . Coronary artery disease [I25.10] 02/16/2016  . Suicidal ideation [R45.851] 02/16/2016   Total Time spent with patient: 20 minutes  Past Psychiatric History: Depression.  Past Medical History:  Past Medical History  Diagnosis Date  . Hypertension   . Thyroid disease   . Coronary artery disease     Past Surgical History  Procedure Laterality Date  . Total thyroidectomy    . Cesarean section    . Esophagogastroduodenoscopy (egd) with propofol N/A 11/24/2015    Procedure: ESOPHAGOGASTRODUODENOSCOPY (EGD) WITH PROPOFOL;  Surgeon: Lollie Sails, MD;  Location: [facility] ENDOSCOPY;  Service: Endoscopy;  Laterality: N/A;  . Colonoscopy N/A 11/24/2015    Procedure: COLONOSCOPY;  Surgeon: Lollie Sails, MD;  Location: [facility] ENDOSCOPY;  Service: Endoscopy;  Laterality: N/A;   Family History: History reviewed. No pertinent family history. Family Psychiatric  History: see H&P. Social History:  History  Alcohol Use  . Yes     History  Drug Use No    Social History   Social History  . Marital Status: Widowed    Spouse Name: N/A  . Number of Children: N/A  . Years of Education: N/A    Social History Main Topics  . Smoking status: Current Every Day Smoker -- 1.00 packs/day for 25 years    Types: Cigarettes  . Smokeless tobacco: Never Used  . Alcohol Use: Yes  . Drug Use: No  . Sexual Activity: Not Asked   Other Topics Concern  . None   Social History Narrative   Additional Social History:                         Sleep: Fair  Appetite:  Fair  Current Medications: Current Facility-Administered Medications  Medication Dose Route Frequency Provider Last Rate Last Dose  . acetaminophen (TYLENOL) tablet 650 mg  650 mg Oral Q6H PRN Gonzella Lex, MD   650 mg at 02/18/16 0923  . albuterol (PROVENTIL HFA;VENTOLIN HFA) 108 (90 Base) MCG/ACT inhaler 2 puff  2 puff Inhalation Q4H PRN Gonzella Lex, MD   2 puff at 02/18/16 0831  . alum & mag hydroxide-simeth (MAALOX/MYLANTA) 200-200-20 MG/5ML suspension 30 mL  30 mL Oral Q4H PRN Gonzella Lex, MD      . aspirin chewable tablet 81 mg  81 mg Oral Daily Jolanta B Pucilowska, MD   81 mg at 02/18/16 0828  . atorvastatin (LIPITOR) tablet 40 mg  40 mg Oral q1800 Gonzella Lex, MD   40 mg at 02/17/16 1819  . busPIRone (BUSPAR) tablet 10 mg  10 mg Oral TID Gonzella Lex, MD   10 mg at 02/18/16 [id number]  . calcitRIOL (ROCALTROL) capsule 0.25 mcg  0.25 mcg Oral Daily John T  Clapacs, MD   0.25 mcg at 02/18/16 0828  . diclofenac sodium (VOLTAREN) 1 % transdermal gel 4 g  4 g Topical QID Clovis Fredrickson, MD   4 g at 02/17/16 1458  . hydrochlorothiazide (MICROZIDE) capsule 12.5 mg  12.5 mg Oral Daily Jolanta B Pucilowska, MD   12.5 mg at 02/18/16 0828  . isosorbide mononitrate (IMDUR) 24 hr tablet 30 mg  30 mg Oral Daily Gonzella Lex, MD   30 mg at 02/18/16 0828  . levothyroxine (SYNTHROID, LEVOTHROID) tablet 150 mcg  150 mcg Oral QAC breakfast Gonzella Lex, MD   150 mcg at 02/18/16 [phone]  . lisinopril (PRINIVIL,ZESTRIL) tablet 10 mg  10 mg Oral Daily Clovis Fredrickson, MD   10 mg at 02/18/16 0828  . magnesium  hydroxide (MILK OF MAGNESIA) suspension 30 mL  30 mL Oral Daily PRN Gonzella Lex, MD      . mometasone-formoterol (DULERA) 200-5 MCG/ACT inhaler 2 puff  2 puff Inhalation BID Gonzella Lex, MD   2 puff at 02/18/16 0831  . nitroGLYCERIN (NITROSTAT) SL tablet 0.4 mg  0.4 mg Sublingual Q5 min PRN Gonzella Lex, MD   0.4 mg at 02/16/16 2041  . pantoprazole (PROTONIX) EC tablet 40 mg  40 mg Oral Daily Gonzella Lex, MD   40 mg at 02/18/16 0643  . prazosin (MINIPRESS) capsule 2 mg  2 mg Oral QHS Clovis Fredrickson, MD   2 mg at 02/17/16 2113  . tiotropium (SPIRIVA) inhalation capsule 18 mcg  18 mcg Inhalation Daily Gonzella Lex, MD   18 mcg at 02/18/16 [id number]  . tiZANidine (ZANAFLEX) tablet 2 mg  2 mg Oral Q6H PRN Gonzella Lex, MD   2 mg at 02/18/16 0831  . traZODone (DESYREL) tablet 100 mg  100 mg Oral QHS Clovis Fredrickson, MD   100 mg at 02/17/16 2114  . venlafaxine XR (EFFEXOR-XR) 24 hr capsule 150 mg  150 mg Oral Q breakfast Jolanta B Pucilowska, MD   150 mg at 02/18/16 0827    Lab Results: No results found for this or any previous visit (from the past 48 hour(s)).  Blood Alcohol level:  Lab Results  Component Value Date   ETH <5 02/15/2016    Physical Findings: AIMS:  , ,  ,  ,    CIWA:    COWS:     Musculoskeletal: Strength & Muscle Tone: within normal limits Gait & Station: normal Patient leans: N/A  Psychiatric Specialty Exam: Review of Systems  Neurological: Positive for headaches.  Psychiatric/Behavioral: Positive for depression and suicidal ideas. The patient is nervous/anxious.   All other systems reviewed and are negative.   Blood pressure 125/77, pulse 71, temperature 98 F (36.7 C), temperature source Oral, resp. rate 18, height 5\' 6"  (1.676 m), weight 83.008 kg (183 lb), SpO2 96 %.Body mass index is 29.55 kg/(m^2).  General Appearance: Casual  Eye Contact::  Good  Speech:  Clear and Coherent  Volume:  Normal  Mood:  Depressed, Hopeless and Worthless   Affect:  Blunt  Thought Process:  Goal Directed  Orientation:  Full (Time, Place, and Person)  Thought Content:  WDL  Suicidal Thoughts:  Yes.  with intent/plan  Homicidal Thoughts:  No  Memory:  Immediate;   Fair Recent;   Fair Remote;   Fair  Judgement:  Fair  Insight:  Shallow  Psychomotor Activity:  Normal  Concentration:  Fair  Recall:  [employer] of  Knowledge:Fair  Language: Fair  Akathisia:  No  Handed:  Right  AIMS (if indicated):     Assets:  Communication Skills Desire for Improvement Financial Resources/Insurance Housing Resilience Social Support  ADL's:  Intact  Cognition: WNL  Sleep:  Number of Hours: 6.75   Treatment Plan Summary: Daily contact with patient to assess and evaluate symptoms and progress in treatment and Medication management   Ms. Mohrbacher is a 60 year old female with history of depression and anxiety admitted for worsening of depression and suicidal ideation in the context of medication noncompliance and substance use.  1. Suicidal ideation. The patient is able to contract for safety in the hospital.  2. Mood. The patient has been maintained on Celexa. It is unclear if she is been compliant. In any case she feels that is not being helpful. We will start Effexor 150 mg daily. We will consider Luvox if Effexor ineffective.  3. Anxiety. The patient believes that she needs Klonopin or Valium for anxiety. She has been taking BuSpar and Vistaril at home. We'll offer Minipress for nightmares.  4. Knee and neck pain. This was her initial complaint in the emergency room. We'll give Voltaren gel.  5. Smoking. Nicotine patch is available.  6. Benzodiazepine abuse. The patient is positive for benzodiazepines on admission. They are not prescribed. We will monitor for symptoms of withdrawal.  7. Cannabis abuse. The patient minimizes her problems and declines treatment.   8. COPD. She is on inhalers.   9. Coronary artery disease. She is on aspirin,  Lipitor, Imdur and nitroglycerin.   10. Hypothyroidism. She is on Synthroid.   11. Hypertension. She is on lisinopril and hydrochlorothiazide.   12. GERD. She is on Protonix.   13. Headache. She responded well to Imitrex.   14. Disposition. She will be discharged back to home with her friends. She wants to follow up with Dr. Rosine Door.   Orson Slick, MD 02/18/2016, 3:34 PM

## 2016-02-18 NOTE — Progress Notes (Signed)
D:  Per pt self inventory pt reports sleeping good with prescribed sleep medication, appetite good, energy level low, rates depression at a 5  out of 10, hopelessness at a 3 out of 10, anxiety at a 7 out of 10, denies SI/HI/AVH, goal today: "get able to go home, try harder to get ready", depressed/anxious during interaction.     A:  Emotional support provided, Encouraged pt to continue with treatment plan and attend all group activities, q15 min checks maintained for safety.  R:  Pt is receptive, going to groups, pleasant and cooperative with staff and other patients on the unit.

## 2016-02-18 NOTE — [facility] Group Notes (Signed)
[facility] LCSW Group Therapy  02/18/2016 3:50 PM  Type of Therapy:  Group Therapy  Participation Level:  Did Not Attend  Modes of Intervention:  Discussion, Education, Socialization and Support  Summary of Progress/Problems: Balance in life: Patients will discuss the concept of balance and how it looks and feels to be unbalanced. Pt will identify areas in their life that is unbalanced and ways to become more balanced.    Camellia Popescu L Sanya Kobrin MSW, LCSWA  02/18/2016, 3:50 PM  

## 2016-02-18 NOTE — Plan of Care (Signed)
Problem: [facility] Participation in Recreation Therapeutic Interventions Goal: STG-Patient will demonstrate improved self esteem by identif STG: Self-Esteem - Within 4 treatment sessions, patient will verbalize at least 5 positive affirmation statements in each of 2 treatment sessions to increase self-esteem post d/c.  Outcome: Progressing Treatment Session 1; Completed 1 out of 2: At approximately 2:40 pm, LRT met with patient in craft room. Patient verbalized 5 positive affirmation statements. Patient reported it felt "good". LRT encouraged patient to continue saying positive affirmation statements.  Leonette Monarch, LRT/CTRS 04.20.17 3:20 pm Goal: STG-Other Recreation Therapy Goal (Specify) STG: Stress Management - Within 4 treatment sessions, patient will verbalize understanding of the stress management techniques in each of 2 treatment sessions to increase stress management skills post d/c.  Outcome: Progressing Treatment Session 1; Completed 1 out of 2: At approximately 2:40 pm, LRT met with patient in craft room. LRT educated and provided patient with handouts on stress management techniques. Patient verbalized understanding. LRT encouraged patient to read over and practice the stress management techniques.  Leonette Monarch, LRT/CTRS 04.20.17 3:22 pm

## 2016-02-18 NOTE — Progress Notes (Signed)
D: Observed pt in day room interacting. Patient alert and oriented x4. Patient denies SI/HI/AVH. Pt affect is sad. Pt stated her mood is "good." Pt rated depression 5/10 and anxiety 0/10. Pt stated groups have been "helpful." Pt forwarded little with writer this evening. Pt c/o of chronic bilateral knee pain and muscle spasms. Pt has no other complaints.  A: Offered active listening and support. Provided therapeutic communication. Administered scheduled medications. Gave tylenol and zanaflex prn for pain and muscle spasms. R: Pt pleasant and cooperative. Pt medication compliant. Will continue Q15 min. checks. Safety maintained.

## 2016-02-19 MED ORDER — TRAZODONE HCL 50 MG PO TABS
150.0000 mg | ORAL_TABLET | Freq: Every day | ORAL | Status: DC
  Administered 2016-02-19 – 2016-02-21 (×3): 150 mg via ORAL
  Filled 2016-02-19 (×3): qty 1

## 2016-02-19 MED ORDER — TIZANIDINE HCL 4 MG PO TABS
4.0000 mg | ORAL_TABLET | Freq: Four times a day (QID) | ORAL | Status: DC | PRN
  Administered 2016-02-19 – 2016-02-22 (×8): 4 mg via ORAL
  Filled 2016-02-19 (×7): qty 1

## 2016-02-19 NOTE — Progress Notes (Signed)
[facility] MD Progress Note  02/19/2016 9:50 AM Maria Warren  MRN:  [id number]  Subjective:  Maria Warren is a 60 year old female with history of depression and substance admitted for suicidal in the context of chronic pain and medication noncompliance as well as substance use. She has not seen a psychiatrist lately because the doctors would not prescribe some medications that she needs, namely Vyvanse and Valium.  Maria Warren reports feeling better today. Her mood is improving, affect is brighter, her anxiety is tolerable. Knee pain is not as profound and the patient refused Voltaren gel. She is out in the community and eagerly participates in programming.  Principal Problem: Major depressive disorder, recurrent severe without psychotic features ([place]) Diagnosis:   Patient Active Problem List   Diagnosis Date Noted  . Tobacco use disorder [F17.200] 02/17/2016  . Major depressive disorder, recurrent severe without psychotic features ([place]) [F33.2] 02/16/2016  . Cannabis use disorder, moderate, dependence ([place]) [F12.20] 02/16/2016  . Hypertension [I10] 02/16/2016  . Hypothyroidism [E03.9] 02/16/2016  . Coronary artery disease [I25.10] 02/16/2016  . Suicidal ideation [R45.851] 02/16/2016   Total Time spent with patient: 20 minutes  Past Psychiatric History: Depression, substance use.  Past Medical History:  Past Medical History  Diagnosis Date  . Hypertension   . Thyroid disease   . Coronary artery disease     Past Surgical History  Procedure Laterality Date  . Total thyroidectomy    . Cesarean section    . Esophagogastroduodenoscopy (egd) with propofol N/A 11/24/2015    Procedure: ESOPHAGOGASTRODUODENOSCOPY (EGD) WITH PROPOFOL;  Surgeon: Lollie Sails, MD;  Location: [facility] ENDOSCOPY;  Service: Endoscopy;  Laterality: N/A;  . Colonoscopy N/A 11/24/2015    Procedure: COLONOSCOPY;  Surgeon: Lollie Sails, MD;  Location: [facility] ENDOSCOPY;  Service: Endoscopy;  Laterality: N/A;   Family  History: History reviewed. No pertinent family history. Family Psychiatric  History: See H&P. Social History:  History  Alcohol Use  . Yes     History  Drug Use No    Social History   Social History  . Marital Status: Widowed    Spouse Name: N/A  . Number of Children: N/A  . Years of Education: N/A   Social History Main Topics  . Smoking status: Current Every Day Smoker -- 1.00 packs/day for 25 years    Types: Cigarettes  . Smokeless tobacco: Never Used  . Alcohol Use: Yes  . Drug Use: No  . Sexual Activity: Not Asked   Other Topics Concern  . None   Social History Narrative   Additional Social History:                         Sleep: Good  Appetite:  Good  Current Medications: Current Facility-Administered Medications  Medication Dose Route Frequency Provider Last Rate Last Dose  . acetaminophen (TYLENOL) tablet 650 mg  650 mg Oral Q6H PRN Gonzella Lex, MD   650 mg at 02/18/16 2138  . albuterol (PROVENTIL HFA;VENTOLIN HFA) 108 (90 Base) MCG/ACT inhaler 2 puff  2 puff Inhalation Q4H PRN Gonzella Lex, MD   2 puff at 02/18/16 0831  . alum & mag hydroxide-simeth (MAALOX/MYLANTA) 200-200-20 MG/5ML suspension 30 mL  30 mL Oral Q4H PRN Gonzella Lex, MD      . aspirin chewable tablet 81 mg  81 mg Oral Daily Clare Fennimore B Charitie Hinote, MD   81 mg at 02/19/16 0837  . atorvastatin (LIPITOR) tablet 40 mg  40  mg Oral q1800 Gonzella Lex, MD   40 mg at 02/18/16 1728  . busPIRone (BUSPAR) tablet 10 mg  10 mg Oral TID Gonzella Lex, MD   10 mg at 02/19/16 0836  . calcitRIOL (ROCALTROL) capsule 0.25 mcg  0.25 mcg Oral Daily Gonzella Lex, MD   0.25 mcg at 02/19/16 0836  . diclofenac sodium (VOLTAREN) 1 % transdermal gel 4 g  4 g Topical QID PRN Dreya Buhrman B Christino Mcglinchey, MD      . hydrochlorothiazide (MICROZIDE) capsule 12.5 mg  12.5 mg Oral Daily Javaeh Muscatello B Canesha Tesfaye, MD   12.5 mg at 02/19/16 0836  . isosorbide mononitrate (IMDUR) 24 hr tablet 30 mg  30 mg Oral Daily Gonzella Lex, MD   30 mg at 02/19/16 0835  . levothyroxine (SYNTHROID, LEVOTHROID) tablet 150 mcg  150 mcg Oral QAC breakfast Gonzella Lex, MD   150 mcg at 02/19/16 0640  . lisinopril (PRINIVIL,ZESTRIL) tablet 10 mg  10 mg Oral Daily Clovis Fredrickson, MD   10 mg at 02/19/16 0837  . magnesium hydroxide (MILK OF MAGNESIA) suspension 30 mL  30 mL Oral Daily PRN Gonzella Lex, MD      . mometasone-formoterol (DULERA) 200-5 MCG/ACT inhaler 2 puff  2 puff Inhalation BID Gonzella Lex, MD   2 puff at 02/19/16 0830  . nitroGLYCERIN (NITROSTAT) SL tablet 0.4 mg  0.4 mg Sublingual Q5 min PRN Gonzella Lex, MD   0.4 mg at 02/18/16 1810  . pantoprazole (PROTONIX) EC tablet 40 mg  40 mg Oral Daily Gonzella Lex, MD   40 mg at 02/19/16 [id number]  . prazosin (MINIPRESS) capsule 2 mg  2 mg Oral QHS Clovis Fredrickson, MD   2 mg at 02/18/16 2138  . SUMAtriptan (IMITREX) tablet 50 mg  50 mg Oral Q2H PRN Madie Cahn B Graeson Nouri, MD      . tiotropium (SPIRIVA) inhalation capsule 18 mcg  18 mcg Inhalation Daily Gonzella Lex, MD   18 mcg at 02/19/16 0830  . tiZANidine (ZANAFLEX) tablet 2 mg  2 mg Oral Q6H PRN Gonzella Lex, MD   2 mg at 02/18/16 2138  . traZODone (DESYREL) tablet 100 mg  100 mg Oral QHS Clovis Fredrickson, MD   100 mg at 02/18/16 2138  . venlafaxine XR (EFFEXOR-XR) 24 hr capsule 150 mg  150 mg Oral Q breakfast Hildred Mollica B Mohit Zirbes, MD   150 mg at 02/19/16 [id number]    Lab Results: No results found for this or any previous visit (from the past 48 hour(s)).  Blood Alcohol level:  Lab Results  Component Value Date   ETH <5 02/15/2016    Physical Findings: AIMS:  , ,  ,  ,    CIWA:    COWS:     Musculoskeletal: Strength & Muscle Tone: within normal limits Gait & Station: normal Patient leans: N/A  Psychiatric Specialty Exam: Review of Systems  Musculoskeletal: Positive for joint pain and neck pain.  Psychiatric/Behavioral: Positive for depression and substance abuse.  All other systems  reviewed and are negative.   Blood pressure 104/68, pulse 85, temperature 98.4 F (36.9 C), temperature source Oral, resp. rate 20, height 5\' 6"  (1.676 m), weight 83.008 kg (183 lb), SpO2 98 %.Body mass index is 29.55 kg/(m^2).  General Appearance: Casual  Eye Contact::  Good  Speech:  Clear and Coherent  Volume:  Normal  Mood:  Anxious  Affect:  Appropriate  Thought Process:  Goal  Directed  Orientation:  Full (Time, Place, and Person)  Thought Content:  WDL  Suicidal Thoughts:  No  Homicidal Thoughts:  No  Memory:  Immediate;   Fair Recent;   Fair Remote;   Fair  Judgement:  Impaired  Insight:  Present  Psychomotor Activity:  Normal  Concentration:  Fair  Recall:  [employer] of Knowledge:Fair  Language: Fair  Akathisia:  No  Handed:  Right  AIMS (if indicated):     Assets:  Communication Skills Desire for Improvement Financial Resources/Insurance Housing Resilience Social Support  ADL's:  Intact  Cognition: WNL  Sleep:  Number of Hours: 7.25   Treatment Plan Summary: Daily contact with patient to assess and evaluate symptoms and progress in treatment and Medication management   Ms. Parmer is a 60 year old female with history of depression and anxiety admitted for worsening of depression and suicidal ideation in the context of medication noncompliance and substance use.  1. Suicidal ideation. The patient is able to contract for safety in the hospital.  2. Mood. The patient has been maintained on Celexa. It is unclear if she is been compliant. In any case she feels that is not being helpful. We started Effexor 150 mg daily.   3. Anxiety. The patient believes that she needs Klonopin or Valium for anxiety. She has been taking BuSpar and Vistaril at home. We offered Minipress for nightmares and increase dose to 4 mg.  4. Knee and neck pain. This was her initial complaint in the emergency room. We'll give Voltaren gel as needed. I will increase Zanaflex to 4 mg as needed,  her usual dose.  5. Smoking. Nicotine patch is available.  6. Benzodiazepine abuse. The patient is positive for benzodiazepines on admission. They are not prescribed. There were no symptoms of withdrawal. Vital signs were stable.   7. Cannabis abuse. The patient minimizes her problems and declines treatment.   8. COPD. She is on inhalers.   9. Coronary artery disease. She is on aspirin, Lipitor, Imdur and nitroglycerin.   10. Hypothyroidism. She is on Synthroid.   11. Hypertension. She is on lisinopril and hydrochlorothiazide.   12. GERD. She is on Protonix.   13. Headache. She responded well to Imitrex.   14. Insomnia. I will increase trazodone 250 mg nightly as she reports interrupted sleep.    15. Disposition. She will be discharged back to home with her friends. She wants to follow up with Dr. Rosine Door.   Orson Slick, MD 02/19/2016, 9:50 AM

## 2016-02-19 NOTE — Progress Notes (Signed)
Recreation Therapy Notes  Date: 04.21.17 Time: 1:00 pm Location: Craft Room  Group Topic: Self-expression, Coping skills  Goal Area(s) Addresses:  Patient will effectively use art as a means of self-expression. Patient will recognize positive benefit of self-expression. Patient will be able to identify one emotion experienced during group. Patient will identify use of art as a coping skill.  Behavioral Response: Attentive, Interactive  Intervention: Two Faces of Me  Activity: Patients were given a blank face worksheet and instructed to draw a line down the middle. On one side of the face, patients were instructed to draw or write how they felt when they were admitted to the hospital. On the other side of the face, patients were instructed to draw or write how they want to feel when they are d/c.  Education: LRT educated patients on different forms of self-expression.  Education Outcome: Acknowledges education/In group clarification offered   Clinical Observations/Feedback: Patient completed activity by writing how she felt when she was admitted and how she wanted to feel when she is d/c. Patient contributed to group discussion by stating what she can do to maintain the positive change.  Leonette Monarch, LRT/CTRS 02/19/2016 2:16 PM

## 2016-02-19 NOTE — Plan of Care (Signed)
Problem: [facility] Participation in Recreation Therapeutic Interventions Goal: STG-Patient will demonstrate improved self esteem by identif STG: Self-Esteem - Within 4 treatment sessions, patient will verbalize at least 5 positive affirmation statements in each of 2 treatment sessions to increase self-esteem post d/c.  Outcome: Completed/Met Date Met:  02/19/16 Treatment Session 2; Completed 2 out of 2: At approximately 11:25 am, LRT met with patient in patient room. Patient verbalized 5 positive affirmation statements. Patient reported it felt "good". LRT encouraged patient to continue saying positive affirmation statements.  Leonette Monarch, LRT/CTRS 04.21.17 11:44 am Goal: STG-Other Recreation Therapy Goal (Specify) STG: Stress Management - Within 4 treatment sessions, patient will verbalize understanding of the stress management techniques in each of 2 treatment sessions to increase stress management skills post d/c.  Outcome: Not Progressing Treatment Session 2; Completed 1 out of 2: At approximately 11:25 am, LRT met with patient in patient room. Patient reported she had not read over the stress management techniques. LRT encouraged patient to read over and practice the stress management techniques.  Leonette Monarch, LRT/CTRS 04.21.17 11:47 am

## 2016-02-19 NOTE — [facility] Group Notes (Signed)
[place] LCSW Group Therapy  02/19/2016 2:24 PM  Type of Therapy:  Group Therapy  Participation Level:  Active   Participation Quality:  Attentive   Affect:  Appropriate   Cognitive: Alert   Insight:  Limited  Engagement in Therapy:  Limited  Modes of Intervention:  Discussion, Education, Socialization and Support  Summary of Progress/Problems: Feelings around Relapse. Group members discussed the meaning of relapse and shared personal stories of relapse, how it affected them and others, and how they perceived themselves during this time. Group members were encouraged to identify triggers, warning signs and coping skills used when facing the possibility of relapse. Social supports were discussed and explored in detail. Pt attended group and stayed the entire time. She discussed her living situation and recognized that it is not a health environment. However, she is not sure how she can change it.   [place] MSW, [place]  02/19/2016, 2:24 PM

## 2016-02-19 NOTE — [facility] Notes (Signed)
[facility] Notes:  (Nursing/MHT/Case Management/Adjunct)  Date:  02/19/2016  Time:  5:39 AM  Type of Therapy:  Group Therapy  Participation Level:  Active  Participation Quality:  Appropriate  Affect:  Appropriate  Cognitive:  Appropriate  Insight:  Appropriate  Engagement in Group:  Engaged  Modes of Intervention:  Discussion  Summary of Progress/Problems:  Maria Warren 02/19/2016, 5:39 AM

## 2016-02-19 NOTE — Progress Notes (Signed)
Patient is pleasant & cooperative.Her affect is brighter.Denies suicidal ideations.Appropriate with staff & peers.Attended groups.Compliant with medications.

## 2016-02-19 NOTE — Plan of Care (Signed)
Problem: Alteration in mood Goal: LTG-Patient reports reduction in suicidal thoughts (Patient reports reduction in suicidal thoughts and is able to verbalize a safety plan for whenever patient is feeling suicidal)  Outcome: Progressing Pt denies SI     

## 2016-02-19 NOTE — [facility] Group Notes (Signed)
[facility] Notes:  (Nursing/MHT/Case Management/Adjunct)  Date:  02/19/2016  Time:  5:08 PM  Type of Therapy:  Psychoeducational Skills  Participation Level:  Active  Participation Quality:  Appropriate, Attentive and Sharing  Affect:  Appropriate  Cognitive:  Alert and Appropriate  Insight:  Appropriate  Engagement in Group:  Engaged  Modes of Intervention:  Discussion, Education and Support  Summary of Progress/Problems:  Adela Lank [facility] 02/19/2016, 5:08 PM

## 2016-02-19 NOTE — Progress Notes (Signed)
D: Observed pt in day room interacting. Patient alert and oriented x4. Patient denies SI/HI/AVH. Pt affect is sad. Pt stated her mood is "good." Pt rated depression 2/10 and anxiety 0/10.  Pt forwarded little with writer this evening. Pt c/o of chronic generalized pain this evening and continued muscle spasms. A: Offered active listening and support. Provided therapeutic communication. Administered scheduled medications. Gave tylenol and zanaflex prn for pain and muscle spasms. R: Pt pleasant and cooperative. Pt reported a decreased in pain. Pt medication compliant. Will continue Q15 min. checks. Safety maintained.

## 2016-02-20 NOTE — Progress Notes (Signed)
D: Observed pt in day room interacting. Patient alert and oriented x4. Patient denies SI/HI/AVH. Pt affect is sad. Pt stated her mood is "good...real good" Pt rated depression 0/10 and anxiety 4/10. Pt forwarded little with writer this evening. Pt c/o of chronic left leg pain and continued muscle spasms.Pt indicated that her mood and affect has improved considerably since when she first came. Pt also briefly talked about situation at home living with individuals that use drugs.  A: Offered active listening and support. Provided therapeutic communication. Administered scheduled medications. Gave tylenol and zanaflex prn for pain and muscle spasms. R: Pt pleasant and cooperative. Pt reported a decreased in pain. Pt medication compliant. Will continue Q15 min. checks. Safety maintained.

## 2016-02-20 NOTE — Progress Notes (Signed)
[facility] MD Progress Note  02/20/2016 11:45 AM Maria Warren  MRN:  [id number]  Subjective:  Maria Warren is a 60 year old female with history of depression and substance admitted for suicidal in the context of chronic pain and medication noncompliance as well as substance use. She has not seen a psychiatrist lately because the doctors would not prescribe some medications that she needs, namely Vyvanse and Valium.  Patient did not have major problems or concerns today. Feels that the medications are helping with her mood. She denies major problems with appetite, sleep, energy and concentration. Denies side effects from her medications. As far as physical complaints she continues to complain of back pain. Feels that the Zanaflex is helping some. She denies suicidality, homicidality or hallucinations. Patient is seeing participated in programming.  Per nursing: Patient with bright affect, cooperative behavior with meals, meds and plan of care. No SI/HI at this time. Good adls, good appetite. Pleasant with peers, verbalizes needs appropriately with staff. Uses prn Zanaflex as ordered with good relief of chronic back and leg pain. Safety maintained. Therapy groups encouraged. Patient's goal for the day is to focus on discharge, stating "in about 2 days".   Principal Problem: Major depressive disorder, recurrent severe without psychotic features ([place]) Diagnosis:   Patient Active Problem List   Diagnosis Date Noted  . Tobacco use disorder [F17.200] 02/17/2016  . Major depressive disorder, recurrent severe without psychotic features ([place]) [F33.2] 02/16/2016  . Cannabis use disorder, moderate, dependence ([place]) [F12.20] 02/16/2016  . Hypertension [I10] 02/16/2016  . Hypothyroidism [E03.9] 02/16/2016  . Coronary artery disease [I25.10] 02/16/2016  . Suicidal ideation [R45.851] 02/16/2016   Total Time spent with patient: 20 minutes  Past Psychiatric History: Depression, substance use.  Past Medical History:   Past Medical History  Diagnosis Date  . Hypertension   . Thyroid disease   . Coronary artery disease     Past Surgical History  Procedure Laterality Date  . Total thyroidectomy    . Cesarean section    . Esophagogastroduodenoscopy (egd) with propofol N/A 11/24/2015    Procedure: ESOPHAGOGASTRODUODENOSCOPY (EGD) WITH PROPOFOL;  Surgeon: Lollie Sails, MD;  Location: [facility] ENDOSCOPY;  Service: Endoscopy;  Laterality: N/A;  . Colonoscopy N/A 11/24/2015    Procedure: COLONOSCOPY;  Surgeon: Lollie Sails, MD;  Location: [facility] ENDOSCOPY;  Service: Endoscopy;  Laterality: N/A;   Family History: History reviewed. No pertinent family history. Family Psychiatric  History: See H&P. Social History:  History  Alcohol Use  . Yes     History  Drug Use No    Social History   Social History  . Marital Status: Widowed    Spouse Name: N/A  . Number of Children: N/A  . Years of Education: N/A   Social History Main Topics  . Smoking status: Current Every Day Smoker -- 1.00 packs/day for 25 years    Types: Cigarettes  . Smokeless tobacco: Never Used  . Alcohol Use: Yes  . Drug Use: No  . Sexual Activity: Not Asked   Other Topics Concern  . None   Social History Narrative    Sleep: Good  Appetite:  Good  Current Medications: Current Facility-Administered Medications  Medication Dose Route Frequency Provider Last Rate Last Dose  . acetaminophen (TYLENOL) tablet 650 mg  650 mg Oral Q6H PRN Gonzella Lex, MD   650 mg at 02/19/16 2132  . albuterol (PROVENTIL HFA;VENTOLIN HFA) 108 (90 Base) MCG/ACT inhaler 2 puff  2 puff Inhalation Q4H PRN Jenny Reichmann T  Clapacs, MD   2 puff at 02/18/16 0831  . alum & mag hydroxide-simeth (MAALOX/MYLANTA) 200-200-20 MG/5ML suspension 30 mL  30 mL Oral Q4H PRN Gonzella Lex, MD      . aspirin chewable tablet 81 mg  81 mg Oral Daily Jolanta B Pucilowska, MD   81 mg at 02/20/16 0914  . atorvastatin (LIPITOR) tablet 40 mg  40 mg Oral q1800 Gonzella Lex,  MD   40 mg at 02/19/16 1713  . busPIRone (BUSPAR) tablet 10 mg  10 mg Oral TID Gonzella Lex, MD   10 mg at 02/20/16 0914  . calcitRIOL (ROCALTROL) capsule 0.25 mcg  0.25 mcg Oral Daily Gonzella Lex, MD   0.25 mcg at 02/20/16 0914  . diclofenac sodium (VOLTAREN) 1 % transdermal gel 4 g  4 g Topical QID PRN Jolanta B Pucilowska, MD      . hydrochlorothiazide (MICROZIDE) capsule 12.5 mg  12.5 mg Oral Daily Jolanta B Pucilowska, MD   12.5 mg at 02/20/16 0915  . isosorbide mononitrate (IMDUR) 24 hr tablet 30 mg  30 mg Oral Daily Gonzella Lex, MD   30 mg at 02/20/16 0914  . levothyroxine (SYNTHROID, LEVOTHROID) tablet 150 mcg  150 mcg Oral QAC breakfast Gonzella Lex, MD   150 mcg at 02/20/16 [phone]  . lisinopril (PRINIVIL,ZESTRIL) tablet 10 mg  10 mg Oral Daily Clovis Fredrickson, MD   10 mg at 02/20/16 0914  . magnesium hydroxide (MILK OF MAGNESIA) suspension 30 mL  30 mL Oral Daily PRN Gonzella Lex, MD      . mometasone-formoterol [facility]) 200-5 MCG/ACT inhaler 2 puff  2 puff Inhalation BID Gonzella Lex, MD   2 puff at 02/20/16 0911  . nitroGLYCERIN (NITROSTAT) SL tablet 0.4 mg  0.4 mg Sublingual Q5 min PRN Gonzella Lex, MD   0.4 mg at 02/18/16 1810  . pantoprazole (PROTONIX) EC tablet 40 mg  40 mg Oral Daily Gonzella Lex, MD   40 mg at 02/20/16 0915  . prazosin (MINIPRESS) capsule 2 mg  2 mg Oral QHS Clovis Fredrickson, MD   2 mg at 02/19/16 2143  . SUMAtriptan (IMITREX) tablet 50 mg  50 mg Oral Q2H PRN Jolanta B Pucilowska, MD      . tiotropium (SPIRIVA) inhalation capsule 18 mcg  18 mcg Inhalation Daily Gonzella Lex, MD   18 mcg at 02/20/16 0912  . tiZANidine (ZANAFLEX) tablet 4 mg  4 mg Oral Q6H PRN Clovis Fredrickson, MD   4 mg at 02/20/16 0916  . traZODone (DESYREL) tablet 150 mg  150 mg Oral QHS Clovis Fredrickson, MD   150 mg at 02/19/16 2132  . venlafaxine XR (EFFEXOR-XR) 24 hr capsule 150 mg  150 mg Oral Q breakfast Jolanta B Pucilowska, MD   150 mg at 02/20/16 [id number]     Lab Results: No results found for this or any previous visit (from the past 48 hour(s)).  Blood Alcohol level:  Lab Results  Component Value Date   ETH <5 02/15/2016     Musculoskeletal: Strength & Muscle Tone: within normal limits Gait & Station: normal Patient leans: N/A  Psychiatric Specialty Exam: Review of Systems  Constitutional: Negative.   HENT: Negative.   Eyes: Negative.   Respiratory: Negative.   Cardiovascular: Negative.   Gastrointestinal: Negative.   Genitourinary: Negative.   Musculoskeletal: Positive for joint pain and neck pain.  Skin: Negative.   Neurological: Negative.   Endo/Heme/Allergies:  Negative.   Psychiatric/Behavioral: Positive for depression and substance abuse.  All other systems reviewed and are negative.   Blood pressure 111/71, pulse 84, temperature 98 F (36.7 C), temperature source Oral, resp. rate 20, height 5\' 6"  (1.676 m), weight 83.008 kg (183 lb), SpO2 98 %.Body mass index is 29.55 kg/(m^2).  General Appearance: Casual  Eye Contact::  Good  Speech:  Clear and Coherent  Volume:  Normal  Mood:  Euthymic  Affect:  Appropriate  Thought Process:  Goal Directed  Orientation:  Full (Time, Place, and Person)  Thought Content:  WDL  Suicidal Thoughts:  No  Homicidal Thoughts:  No  Memory:  Immediate;   Fair Recent;   Fair Remote;   Fair  Judgement:  Impaired  Insight:  Present  Psychomotor Activity:  Normal  Concentration:  Fair  Recall:  [employer] of Knowledge:Fair  Language: Fair  Akathisia:  No  Handed:  Right  AIMS (if indicated):     Assets:  Communication Skills Desire for Improvement Financial Resources/Insurance Housing Resilience Social Support  ADL's:  Intact  Cognition: WNL  Sleep:  Number of Hours: 6.5   Treatment Plan Summary: Daily contact with patient to assess and evaluate symptoms and progress in treatment and Medication management   Ms. Hirose is a 60 year old female with history of depression  and anxiety admitted for worsening of depression and suicidal ideation in the context of medication noncompliance and substance use.  1. Suicidal ideation. The patient is able to contract for safety in the hospital.  2. Mood: Continue Effexor 150 mg by mouth daily. Patient is tolerating the Effexor well  3. Anxiety. The patient believes that she needs Klonopin or Valium for anxiety. She has been taking BuSpar and Vistaril at home. We offered Minipress for nightmares and increase dose to 4 mg.  4. Knee and neck pain. This was her initial complaint in the emergency room. We'll give Voltaren gel as needed.  Continue Zanaflex to 4 mg as needed, her usual dose.  5. Smoking. Nicotine patch is available.  6. Benzodiazepine abuse. The patient is positive for benzodiazepines on admission. They are not prescribed. There were no symptoms of withdrawal. Vital signs were stable.   7. Cannabis abuse. The patient minimizes her problems and declines treatment.   8. COPD. She is on inhalers.   9. Coronary artery disease. She is on aspirin, Lipitor, Imdur and nitroglycerin.   10. Hypothyroidism. She is on Synthroid.   11. Hypertension. She is on lisinopril and hydrochlorothiazide.   12. GERD. She is on Protonix.   13. Headache. She responded well to Imitrex.   14. Insomnia: Continue trazodone 150 mg by mouth daily at bedtime. The patient says she is sleeping much better with this dose  15. Disposition. She will be discharged back to home with her friends. She wants to follow up with Dr. Rosine Door.   Hildred Priest, MD 02/20/2016, 11:45 AM

## 2016-02-20 NOTE — Plan of Care (Signed)
Problem: Alteration in mood Goal: LTG-Patient reports reduction in suicidal thoughts (Patient reports reduction in suicidal thoughts and is able to verbalize a safety plan for whenever patient is feeling suicidal)  Outcome: Progressing Bright affect, no SI/HI at this time.

## 2016-02-20 NOTE — Plan of Care (Signed)
Problem: Alteration in mood Goal: LTG-Patient reports reduction in suicidal thoughts (Patient reports reduction in suicidal thoughts and is able to verbalize a safety plan for whenever patient is feeling suicidal)  Outcome: Progressing Pt denies SI at this time     

## 2016-02-20 NOTE — Plan of Care (Signed)
Problem: Diagnosis: Increased Risk For Suicide Attempt Goal: LTG-Patient Will Report Improved Mood and Deny Suicidal LTG (by discharge) Patient will report improved mood and deny suicidal ideation.  Outcome: Progressing Denies SI and rated depression 0/10.

## 2016-02-20 NOTE — Progress Notes (Addendum)
Patient with bright affect, cooperative behavior with meals, meds and plan of care. No SI/HI at this time. Good adls, good appetite. Pleasant with peers, verbalizes needs appropriately with staff. Uses prn Zanaflex as ordered with good relief of chronic back and leg pain. Safety maintained. Therapy groups encouraged. Patient's goal for the day is to focus on discharge, stating "in about 2 days".

## 2016-02-20 NOTE — Progress Notes (Signed)
D: Pt denies SI/HI/AVH. Pt is pleasant and cooperative. Pt interacting on unit with peers and staff. Pt not as somatic this evening as previously reported.   A: Pt was offered support and encouragement. Pt was given scheduled medications. Pt was encourage to attend groups. Q 15 minute checks were done for safety.  R:Pt attends groups and interacts well with peers and staff. Pt is taking medication. Pt has no complaints.Pt receptive to treatment and safety maintained on unit.

## 2016-02-21 MED ORDER — TRAZODONE HCL 150 MG PO TABS: 150.0000 mg | ORAL_TABLET | Freq: Every day | ORAL | Status: DC

## 2016-02-21 MED ORDER — VENLAFAXINE HCL ER 150 MG PO CP24: 150.0000 mg | ORAL_CAPSULE | Freq: Every day | ORAL | Status: DC

## 2016-02-21 MED ORDER — BUSPIRONE HCL 10 MG PO TABS: 10.0000 mg | ORAL_TABLET | Freq: Three times a day (TID) | ORAL | Status: DC

## 2016-02-21 MED ORDER — CALCITRIOL 0.25 MCG PO CAPS
0.5000 ug | ORAL_CAPSULE | Freq: Three times a day (TID) | ORAL | Status: DC
  Administered 2016-02-21 – 2016-02-22 (×3): 0.5 ug via ORAL
  Filled 2016-02-21 (×3): qty 2

## 2016-02-21 NOTE — Progress Notes (Addendum)
A&Ox3, lower back and left leg pain of 7/10, Zanaflex 4 mg given; elevated BP on re-assessment and asymptomatic. Excited for anticipated discharge, "Dr. told me one more day and I will be going home tomorrow ..." Pleasant, bright affect, polite, interacting well with peers. Denies SI/HI, denies AV/H.
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
## 2016-02-21 NOTE — [facility] Group Notes (Signed)
[facility] Group Notes:  (Nursing/MHT/Case Management/Adjunct)  Date:  02/21/2016  Time:  9:29 PM  Type of Therapy:  Psychoeducational Skills  Participation Level:  Active  Participation Quality:  Appropriate and Supportive  Affect:  Appropriate  Cognitive:  Appropriate  Insight:  Appropriate  Engagement in Group:  Engaged and Supportive  Modes of Intervention:  Discussion, Exploration and Support  Summary of Progress/Problems:  Maria [facility] 02/21/2016, 9:29 PM

## 2016-02-21 NOTE — Tx Team (Signed)
Interdisciplinary Treatment Plan Update (Adult)  Date:  02/21/2016 Time Reviewed:  10:27 PM  Progress in Treatment: Attending groups: Yes. Participating in groups:  Yes. Taking medication as prescribed:  Yes. Tolerating medication:  Yes. Family/Significant othe contact made:  No, patient refused Patient understands diagnosis:  Yes. Discussing patient identified problems/goals with staff:  Yes. Medical problems stabilized or resolved:  Yes. Denies suicidal/homicidal ideation: Yes. Issues/concerns per patient self-inventory:  Yes. Other:  New problem(s) identified: No, Describe:  none reported  Discharge Plan or Barriers: Patient will stabilize on meds and discharge home with outpatient mental health follow up in [facility]   Reason for Continuation of Hospitalization: Depression Medication stabilization Suicidal ideation  Comments:  Estimated length of stay: up to 4 days, expected discharge Monday 02/22/16  New goal(s):  Review of initial/current patient goals per problem list:   1.  Goal(s): Participate in aftercare plan   Met:  Yes  Target date: by discharge  As evidenced by: patient will participate in aftercare plan AEB aftercare provider and housing plan identified at discharge 02/18/16: Patient will follow up with her psychiatrist and discharge home once stabilized on meds. Goal met  2.  Goal (s): Decrease depression  Met:  No  Target date: by discharge  As evidenced by: patient demonstrates decreased symptoms of depression and reports a Depression rating of 3 or less 02/18/16: patient will continue to be monitored for depression and adjusting medications.   Attendees: Patient:  [facility] 4/23/201710:27 PM  Family:   4/23/201710:27 PM  Physician:  Orson Slick, MD 4/23/201710:27 PM  Nursing:   Elige Radon, RN 4/23/201710:27 PM  Case Manager:   4/23/201710:27 PM  Counselor:   4/23/201710:27 PM  Other:  Carmell Austria, LCSW 4/23/201710:27 PM   Other:  Bonnye Fava, [place] 4/23/201710:27 PM  Other:  Everitt Amber, LRT 4/23/201710:27 PM  Other: Garald Braver, Psych D 4/23/201710:27 PM  Other:  4/23/201710:27 PM  Other:  4/23/201710:27 PM  Other:  4/23/201710:27 PM  Other:  4/23/201710:27 PM  Other:  4/23/201710:27 PM  Other:   4/23/201710:27 PM   Scribe for Treatment Team:   Keene Breath, MSW, LCSW 02/21/2016, 10:27 PM

## 2016-02-21 NOTE — [facility] Group Notes (Signed)
[facility] LCSW Group Therapy  02/21/2016 5:22 PM  Type of Therapy:  Group Therapy  Participation Level:  Active  Participation Quality:  Attentive  Affect:  Appropriate  Cognitive:  Appropriate  Insight:  Developing/Improving  Engagement in Therapy:  Engaged  Modes of Intervention:  Discussion, Education and Problem-solving  Summary of Progress/Problems: LCSW reviewed group rules. Today's topic was emotional regulations. Patient had good insight that when she is manic she is overly excited, tirelessly and talkative ect. Patient was able to relate to the down side emotions when she is in a depressed state as well. Patient shared good ideas of support to her peers and followed group discussion well.  [name] M 02/21/2016, 5:22 PM

## 2016-02-21 NOTE — Progress Notes (Signed)
[facility] MD Progress Note  02/21/2016 10:19 AM Maria Warren  MRN:  [id number]  Subjective:  Ms. Singerman is a 60 year old female with history of depression and substance admitted for suicidal in the context of chronic pain and medication noncompliance as well as substance use. She has not seen a psychiatrist lately because the doctors would not prescribe some medications that she needs, namely Vyvanse and Valium.  Patient did not have major problems or concerns today. Feels that the medications are helping with her mood. She denies major problems with appetite, sleep, energy and concentration. Denies side effects from her medications. As far as physical complaints she continues to complain of back pain. Feels that the Zanaflex is helping some. She denies suicidality, homicidality or hallucinations. Patient is seeing participated in programming.  Her issues today were concerns that her blood pressure was too long she was feeling drained and tired as a result of that. She also was complaining that the dose of calcium she is getting here is not the same she was prescribed with.   Per nursing: Pt denies SI/HI/AVH. Pt is pleasant and cooperative. Pt interacting on unit with peers and staff. Pt not as somatic this evening as previously reported.   Principal Problem: Major depressive disorder, recurrent severe without psychotic features ([place]) Diagnosis:   Patient Active Problem List   Diagnosis Date Noted  . Tobacco use disorder [F17.200] 02/17/2016  . Major depressive disorder, recurrent severe without psychotic features ([place]) [F33.2] 02/16/2016  . Cannabis use disorder, moderate, dependence ([place]) [F12.20] 02/16/2016  . Hypertension [I10] 02/16/2016  . Hypothyroidism [E03.9] 02/16/2016  . Coronary artery disease [I25.10] 02/16/2016  . Suicidal ideation [R45.851] 02/16/2016   Total Time spent with patient: 20 minutes  Past Psychiatric History: Depression, substance use.  Past Medical History:  Past  Medical History  Diagnosis Date  . Hypertension   . Thyroid disease   . Coronary artery disease     Past Surgical History  Procedure Laterality Date  . Total thyroidectomy    . Cesarean section    . Esophagogastroduodenoscopy (egd) with propofol N/A 11/24/2015    Procedure: ESOPHAGOGASTRODUODENOSCOPY (EGD) WITH PROPOFOL;  Surgeon: Lollie Sails, MD;  Location: [facility] ENDOSCOPY;  Service: Endoscopy;  Laterality: N/A;  . Colonoscopy N/A 11/24/2015    Procedure: COLONOSCOPY;  Surgeon: Lollie Sails, MD;  Location: [facility] ENDOSCOPY;  Service: Endoscopy;  Laterality: N/A;   Family History: History reviewed. No pertinent family history. Family Psychiatric  History: See H&P. Social History:  History  Alcohol Use  . Yes     History  Drug Use No    Social History   Social History  . Marital Status: Widowed    Spouse Name: N/A  . Number of Children: N/A  . Years of Education: N/A   Social History Main Topics  . Smoking status: Current Every Day Smoker -- 1.00 packs/day for 25 years    Types: Cigarettes  . Smokeless tobacco: Never Used  . Alcohol Use: Yes  . Drug Use: No  . Sexual Activity: Not Asked   Other Topics Concern  . None   Social History Narrative    Sleep: Good  Appetite:  Good  Current Medications: Current Facility-Administered Medications  Medication Dose Route Frequency Provider Last Rate Last Dose  . acetaminophen (TYLENOL) tablet 650 mg  650 mg Oral Q6H PRN Gonzella Lex, MD   650 mg at 02/19/16 2132  . albuterol (PROVENTIL HFA;VENTOLIN HFA) 108 (90 Base) MCG/ACT inhaler 2 puff  2 puff Inhalation Q4H PRN Gonzella Lex, MD   2 puff at 02/18/16 0831  . alum & mag hydroxide-simeth (MAALOX/MYLANTA) 200-200-20 MG/5ML suspension 30 mL  30 mL Oral Q4H PRN Gonzella Lex, MD      . aspirin chewable tablet 81 mg  81 mg Oral Daily Jolanta B Pucilowska, MD   81 mg at 02/21/16 0824  . atorvastatin (LIPITOR) tablet 40 mg  40 mg Oral q1800 Gonzella Lex, MD   40  mg at 02/20/16 1624  . busPIRone (BUSPAR) tablet 10 mg  10 mg Oral TID Gonzella Lex, MD   10 mg at 02/21/16 0825  . calcitRIOL (ROCALTROL) capsule 0.5 mcg  0.5 mcg Oral TID Hildred Priest, MD      . diclofenac sodium (VOLTAREN) 1 % transdermal gel 4 g  4 g Topical QID PRN Jolanta B Pucilowska, MD      . hydrochlorothiazide (MICROZIDE) capsule 12.5 mg  12.5 mg Oral Daily Clovis Fredrickson, MD   Stopped at 02/21/16 0830  . isosorbide mononitrate (IMDUR) 24 hr tablet 30 mg  30 mg Oral Daily Gonzella Lex, MD   Stopped at 02/21/16 [phone]  . levothyroxine (SYNTHROID, LEVOTHROID) tablet 150 mcg  150 mcg Oral QAC breakfast Gonzella Lex, MD   150 mcg at 02/21/16 [id number]  . lisinopril (PRINIVIL,ZESTRIL) tablet 10 mg  10 mg Oral Daily Clovis Fredrickson, MD   Stopped at 02/21/16 0830  . magnesium hydroxide (MILK OF MAGNESIA) suspension 30 mL  30 mL Oral Daily PRN Gonzella Lex, MD      . mometasone-formoterol [facility]) 200-5 MCG/ACT inhaler 2 puff  2 puff Inhalation BID Gonzella Lex, MD   2 puff at 02/21/16 0820  . nitroGLYCERIN (NITROSTAT) SL tablet 0.4 mg  0.4 mg Sublingual Q5 min PRN Gonzella Lex, MD   0.4 mg at 02/18/16 1810  . pantoprazole (PROTONIX) EC tablet 40 mg  40 mg Oral Daily Gonzella Lex, MD   40 mg at 02/21/16 0825  . SUMAtriptan (IMITREX) tablet 50 mg  50 mg Oral Q2H PRN Jolanta B Pucilowska, MD      . tiotropium (SPIRIVA) inhalation capsule 18 mcg  18 mcg Inhalation Daily Gonzella Lex, MD   18 mcg at 02/21/16 0819  . tiZANidine (ZANAFLEX) tablet 4 mg  4 mg Oral Q6H PRN Clovis Fredrickson, MD   4 mg at 02/21/16 0826  . traZODone (DESYREL) tablet 150 mg  150 mg Oral QHS Clovis Fredrickson, MD   150 mg at 02/20/16 2141  . venlafaxine XR (EFFEXOR-XR) 24 hr capsule 150 mg  150 mg Oral Q breakfast Jolanta B Pucilowska, MD   150 mg at 02/21/16 [id number]    Lab Results: No results found for this or any previous visit (from the past 48 hour(s)).  Blood Alcohol level:  Lab  Results  Component Value Date   ETH <5 02/15/2016     Musculoskeletal: Strength & Muscle Tone: within normal limits Gait & Station: normal Patient leans: N/A  Psychiatric Specialty Exam: Review of Systems  Constitutional: Negative.   HENT: Negative.   Eyes: Negative.   Respiratory: Negative.   Cardiovascular: Negative.   Gastrointestinal: Negative.   Genitourinary: Negative.   Musculoskeletal: Positive for joint pain and neck pain.  Skin: Negative.   Neurological: Negative.   Endo/Heme/Allergies: Negative.   Psychiatric/Behavioral: Positive for depression and substance abuse.  All other systems reviewed and are negative.   Blood pressure 97/66,  pulse 91, temperature 98.2 F (36.8 C), temperature source Oral, resp. rate 20, height 5\' 6"  (1.676 m), weight 83.008 kg (183 lb), SpO2 98 %.Body mass index is 29.55 kg/(m^2).  General Appearance: Casual  Eye Contact::  Good  Speech:  Clear and Coherent  Volume:  Normal  Mood:  Euthymic  Affect:  Appropriate  Thought Process:  Goal Directed  Orientation:  Full (Time, Place, and Person)  Thought Content:  WDL  Suicidal Thoughts:  No  Homicidal Thoughts:  No  Memory:  Immediate;   Fair Recent;   Fair Remote;   Fair  Judgement:  Fair  Insight:  Fair  Psychomotor Activity:  Normal  Concentration:  Fair  Recall:  [employer] of Knowledge:Fair  Language: Fair  Akathisia:  No  Handed:  Right  AIMS (if indicated):     Assets:  Communication Skills Desire for Improvement Financial Resources/Insurance Housing Resilience Social Support  ADL's:  Intact  Cognition: WNL  Sleep:  Number of Hours: 6.3   Treatment Plan Summary: Daily contact with patient to assess and evaluate symptoms and progress in treatment and Medication management   Ms. Drilling is a 60 year old female with history of depression and anxiety admitted for worsening of depression and suicidal ideation in the context of medication noncompliance and substance  use.  1. Suicidal ideation. The patient is able to contract for safety in the hospital.  2. Mood: Continue Effexor 150 mg by mouth daily. Patient is tolerating the Effexor well.  No changes  3. Anxiety. The patient believes that she needs Klonopin or Valium for anxiety. She has been taking BuSpar and Vistaril at home.   4. Knee and neck pain. This was her initial complaint in the emergency room. We'll give Voltaren gel as needed.  Continue Zanaflex to 4 mg as needed, her usual dose.  5. Smoking. Nicotine patch is available.  6. Benzodiazepine abuse. The patient is positive for benzodiazepines on admission. They are not prescribed. There were no symptoms of withdrawal. Vital signs were stable.   7. Cannabis abuse. The patient minimizes her problems and declines treatment.   8. COPD. She is on inhalers.   9. Coronary artery disease. She is on aspirin, Lipitor, Imdur and nitroglycerin.   10. Hypothyroidism. She is on Synthroid.   11. Hypertension. She is on lisinopril and hydrochlorothiazide.   12. GERD. She is on Protonix.   13. Headache. She responded well to Imitrex.   14. Insomnia: Continue trazodone 150 mg by mouth daily at bedtime. No longer reporting insomnia  15. Disposition. She will be discharged back to home with her friends. She wants to follow up with Dr. Rosine Door.  Patient reports that her blood pressure was very low this morning and shaved felt very tired and drained. Blood pressure this morning was 97/66.  Patient was positive for orthostatics. I reviewed her medications. She has being started on Minipress here which is likely the reason why she is having orthostatic hypotension. This medication will be discontinued. Patient also tells me that at home she is taking 6 tablets of 0.25 g of calcium per day. I have change her dose of calcium  to an equivalent dose.  Patient is requesting medications for anxiety. I have advised her to discuss this with Dr.  Bary Leriche.   Hildred Priest, MD 02/21/2016, 10:19 AM

## 2016-02-21 NOTE — Plan of Care (Signed)
Problem: Diagnosis: Increased Risk For Suicide Attempt Goal: STG-Patient Will Report Suicidal Feelings to Staff Outcome: Adequate for Discharge Denied SI/HI, denied AV/H, patient allowed to vent, room closer to the nurses' station, door open for eyesight view and 15 minute checks. Will continue to monitor for safety.

## 2016-02-21 NOTE — BHH Suicide Risk Assessment (Signed)
[facility] Discharge Suicide Risk Assessment   Principal Problem: Major depressive disorder, recurrent severe without psychotic features [facility]) Discharge Diagnoses:  Patient Active Problem List   Diagnosis Date Noted  . Tobacco use disorder [F17.200] 02/17/2016  . Major depressive disorder, recurrent severe without psychotic features ([place]) [F33.2] 02/16/2016  . Cannabis use disorder, moderate, dependence ([place]) [F12.20] 02/16/2016  . Hypertension [I10] 02/16/2016  . Hypothyroidism [E03.9] 02/16/2016  . Coronary artery disease [I25.10] 02/16/2016  . Suicidal ideation [R45.851] 02/16/2016    Total Time spent with patient: 30 minutes  Musculoskeletal: Strength & Muscle Tone: within normal limits Gait & Station: normal Patient leans: N/A  Psychiatric Specialty Exam: Review of Systems  All other systems reviewed and are negative.   Blood pressure 97/66, pulse 91, temperature 98.2 F (36.8 C), temperature source Oral, resp. rate 20, height 5\' 6"  (1.676 m), weight 83.008 kg (183 lb), SpO2 98 %.Body mass index is 29.55 kg/(m^2).  General Appearance: Casual  Eye Contact::  Good  Speech:  Clear and [id number]  Volume:  Normal  Mood:  Euthymic  Affect:  Appropriate  Thought Process:  Goal Directed  Orientation:  Full (Time, Place, and Person)  Thought Content:  WDL  Suicidal Thoughts:  No  Homicidal Thoughts:  No  Memory:  Immediate;   Fair Recent;   Fair Remote;   Fair  Judgement:  Fair  Insight:  Fair  Psychomotor Activity:  Normal  Concentration:  Fair  Recall:  [employer] [place]  Language: Fair  Akathisia:  No  Handed:  Right  AIMS (if indicated):     Assets:  Communication Skills Desire for Improvement Financial Resources/Insurance Housing Physical Health Resilience Social Support  Sleep:  Number of Hours: 6.3  Cognition: WNL  ADL's:  Intact   Mental Status Per Nursing Assessment::   On Admission:     Demographic Factors:  Divorced or widowed and Gay,  lesbian, or bisexual orientation  Loss Factors: NA  Historical Factors: Impulsivity  Risk Reduction Factors:   Sense of responsibility to family, Living with another person, especially a relative, Positive social support and Positive therapeutic relationship  Continued Clinical Symptoms:  Depression:   Impulsivity Alcohol/Substance Abuse/Dependencies  Cognitive Features That Contribute To Risk:  None    Suicide Risk:  Minimal: No identifiable suicidal ideation.  Patients presenting with no risk factors but with morbid ruminations; may be classified as minimal risk based on the severity of the depressive symptoms  Follow-up Information    Go to [place].   Why:  For follow-up care appt    Contact information:   [address] [place], [place] [phone] Fax [phone]       Plan Of Care/Follow-up recommendations:  Activity:  as tolerated Diet:  low sodium heart healthy Other:  kep follow up appointments  Orson Slick, MD 02/21/2016, 8:25 PM

## 2016-02-21 NOTE — BHH Group Notes (Signed)
[name]  02/21/2016 7:54 AM  Type of Therapy:  Group Therapy  Participation Level:  Active  Participation Quality:  Appropriate  Affect:  Appropriate  Cognitive:  Appropriate  Insight:  Developing/Improving  Engagement in Therapy:  Developing/Improving  Modes of Intervention:  Discussion, Exploration and Problem-solving  Summary of Progress/Problems:LCSW conducted group therapy outdoors and reviewed group rules. Each group member was asked to introduce themselves and discuss an obstacle they face when it comes to their illness and how they overcome those obstacles. Group members supported each other during this process. LCSW provided therapeutic and motivational questioning and humour. This patient reported her obstacle as being overly depressed at times and being stuck she was able to identify coping skills and peers supported her.    Maria Warren M 02/21/2016, 7:54 AM

## 2016-02-21 NOTE — Progress Notes (Addendum)
Patient with appropriate affect, cooperative with meals, meds and plan of care. No SI/HI at this time. Good adls, good appetite. Social and pleasant with peers, verbalizes needs appropriately with staff. Groups encouraged. Speaks on phone with family/friends. Safety maintained. Uses Zanaflex with good result for chronic pain.

## 2016-02-22 MED ORDER — LISINOPRIL-HYDROCHLOROTHIAZIDE 10-12.5 MG PO TABS: 1.0000 | ORAL_TABLET | Freq: Every day | ORAL | Status: DC

## 2016-02-22 MED ORDER — TIZANIDINE HCL 2 MG PO TABS: 2.0000 mg | ORAL_TABLET | Freq: Four times a day (QID) | ORAL | Status: DC | PRN

## 2016-02-22 NOTE — Tx Team (Signed)
Interdisciplinary Treatment Plan Update (Adult)  Date:  02/22/2016 Time Reviewed:  4:37 PM  Progress in Treatment: Attending groups: Yes. Participating in groups:  Yes. Taking medication as prescribed:  Yes. Tolerating medication:  Yes. Family/Significant othe contact made:  No, patient refused Patient understands diagnosis:  Yes. Discussing patient identified problems/goals with staff:  Yes. Medical problems stabilized or resolved:  Yes. Denies suicidal/homicidal ideation: Yes. Issues/concerns per patient self-inventory:  Yes. Other:  New problem(s) identified: No, Describe:  none reported  Discharge Plan or Barriers: Patient will stabilize on meds and discharge home with outpatient mental health follow up with [facility] for medication management and therapy   Reason for Continuation of Hospitalization: Depression Medication stabilization Suicidal ideation  Comments:  Estimated expected discharge: Monday 02/22/16  New goal(s):  Review of initial/current patient goals per problem list:   1.  Goal(s): Participate in aftercare plan   Met:  Yes  Target date: by discharge  As evidenced by: patient will participate in aftercare plan AEB aftercare provider and housing plan identified at discharge 02/18/16: Patient will follow up with her psychiatrist and discharge home once stabilized on meds. Goal met  4/24: Patient will stabilize on meds and discharge home with outpatient mental health follow up with [facility] for medication management and therapy   2.  Goal (s): Decrease depression  Met:  Adequate for discharge per MD. Target date: by discharge  As evidenced by: patient demonstrates decreased symptoms of depression and reports a Depression rating of 3 or less 02/18/16: patient will continue to be monitored for depression and adjusting medications.  4/24: Adequate for discharge per MD.    Attendees: Patient:   4/24/20174:37 PM  Family:   4/24/20174:37 PM   Physician:  Orson Slick, MD 4/24/20174:37 PM  Nursing:   Elige Radon, RN 4/24/20174:37 PM  Case Manager:   4/24/20174:37 PM  Counselor:   4/24/20174:37 PM  Other:  Marylou Flesher, LCSW 4/24/20174:37 PM  Other:  , LCSWA 4/24/20174:37 PM  Other:  Everitt Amber, [place] 4/24/20174:37 PM  Nursing: Terressa Koyanagi 4/24/20174:37 PM  Nursing: Lucile Shutters 4/24/20174:37 PM  Other:  4/24/20174:37 PM  Other:  4/24/20174:37 PM  Other:  4/24/20174:37 PM  Other:  4/24/20174:37 PM  Other:   4/24/20174:37 PM   Scribe for Treatment Team:   Claudine Mouton, MSW, LCSW 02/22/2016, 4:37 PM

## 2016-02-22 NOTE — Progress Notes (Signed)
Recreation Therapy Notes  INPATIENT RECREATION TR PLAN  Patient Details Name: Maria Warren MRN: [id number] DOB: January 29, 1956 Today's Date: 02/22/2016  Rec Therapy Plan Is patient appropriate for Therapeutic Recreation?: Yes Treatment times per week: At least once a week TR Treatment/Interventions: 1:1 session, Group participation (Comment) (Appropriate participation in daily recreational therapy tx)  Discharge Criteria Pt will be discharged from therapy if:: Treatment goals are met, Discharged Treatment plan/goals/alternatives discussed and agreed upon by:: Patient/family  Discharge Summary Short term goals set: See Care Plan Short term goals met: Complete Progress toward goals comments: One-to-one attended Which groups?: Self-esteem, Leisure education, Coping skills, Other (Comment) (Self-expression) One-to-one attended: Self-esteem, stress management Reason goals not met: N/A Therapeutic equipment acquired: None Reason patient discharged from therapy: Discharge from hospital Pt/family agrees with progress & goals achieved: Yes Date patient discharged from therapy: 02/22/16   Leonette Monarch, LRT/CTRS 02/22/2016, 2:49 PM

## 2016-02-22 NOTE — BHH Suicide Risk Assessment (Signed)
[place] INPATIENT:  Family/Significant Other Suicide Prevention Education  Suicide Prevention Education:  Education Completed; Maria Warren [id number] has been identified by the patient as the family member/significant other with whom the patient will be residing, and identified as the person(s) who will aid the patient in the event of a mental health crisis (suicidal ideations/suicide attempt).  With written consent from the patient, the family member/significant other has been provided the following suicide prevention education, prior to the and/or following the discharge of the patient.  The suicide prevention education provided includes the following:  Suicide risk factors  Suicide prevention and interventions  National Suicide Hotline telephone number  [facility] assessment telephone number  [facility] Emergency Assistance [place] and/or Residential Mobile Crisis Unit telephone number  Request made of family/significant other to:  Remove weapons (e.g., guns, rifles, knives), all items previously/currently identified as safety concern.    Remove drugs/medications (over-the-counter, prescriptions, illicit drugs), all items previously/currently identified as a safety concern.  The family member/significant other verbalizes understanding of the suicide prevention education information provided.  The family member/significant other agrees to remove the items of safety concern listed above.  [name] M 02/22/2016, 12:09 PM

## 2016-02-22 NOTE — Progress Notes (Addendum)
  [facility] Adult Case Management Discharge Plan :  Will you be returning to the same living situation after discharge:  Yes,  with friends and roomate At discharge, do you have transportation home?: Yes,  Roomates  Do you have the ability to pay for your medications: Yes, pt will be provided with prescriptions at discharge  Release of information consent forms completed and in the chart;  Patient's signature needed at discharge.  Patient to Follow up at: Follow-up Information    Go to [facility].   Why:  Please arrive for your follow-up care appt on Thursday May 18th at 1:30pm for a re-assessment for medication managment, substance abuse treatment and therapy.  Please call to reschedule for an earlier apopt at discharge.   Contact information:   [place], [place] Ph [phone] Fax [phone]       Next level of care provider has access to [place] and Suicide Prevention discussed: Yes,  Hand out provided  Have you used any form of tobacco in the last 30 days? (Cigarettes, Smokeless Tobacco, Cigars, and/or Pipes): Yes  Has patient been referred to the Quitline?: Patient refused referral  Patient has been referred for addiction treatment: Yes  Maria Warren 02/22/2016, 4:45 PM

## 2016-02-22 NOTE — Progress Notes (Signed)
LCSW met with patient and provided suicide prevention and intervention handout and reviewed her discharge instructions. Patient will take her medications as prescribed. She reports she is not suicidal or homicidal and will keep her follow up appointment with Dr Teodora Medici LCSW

## 2016-02-22 NOTE — Plan of Care (Signed)
Problem: [facility] Participation in Recreation Therapeutic Interventions Goal: STG-Other Recreation Therapy Goal (Specify) STG: Stress Management - Within 4 treatment sessions, patient will verbalize understanding of the stress management techniques in each of 2 treatment sessions to increase stress management skills post d/c.  Outcome: Completed/Met Date Met:  02/22/16 Treatment Session 3; Completed 2 out of 2: At approximately 10:05 am, LRT met with patient in patient room. Patient verbalized understanding of the stress management techniques. LRT encouraged patient to use the techniques post d/c.  Leonette Monarch, LRT/CTRS 04.24.17 11:26 am  Problem: [facility] Participation in Recreation Therapeutic Interventions Goal: STG-Patient will demonstrate improved self esteem by identif STG: Self-Esteem - Within 3 treatment sessions, patient will verbalize at least 5 positive affirmation statements in one treatment session to increase self-esteem post d/c. Outcome: Completed/Met Date Met:  02/22/16 Treatment Session 1; Completed 1 out of 1: At approximately 10:05 am, LRT met with patient in patient room. Patient verbalized 5 positive affirmation statements. Patient reported it felt "good" and "wonderful". LRT encouraged patient to continue saying positive affirmation statements.  Leonette Monarch, LRT/CTRS 04.24.17 11:28 am

## 2016-02-22 NOTE — Progress Notes (Signed)
Patient's discharge instructions and medications discussed with patient. She verbalized understanding. Patient belongings including the locked up contrabands given back to patient. Patient did not voice any concerns as far as her property is concerned. Patient escorted to the lobby where her ride was waiting.  Patient was very excited to leave.

## 2016-07-29 DIAGNOSIS — R519 Headache, unspecified: Secondary | ICD-10-CM | POA: Insufficient documentation

## 2016-08-02 ENCOUNTER — Other Ambulatory Visit: Payer: Self-pay | Admitting: Family Medicine

## 2016-08-02 ENCOUNTER — Other Ambulatory Visit: Payer: Self-pay | Admitting: General Practice

## 2016-08-02 DIAGNOSIS — Z1231 Encounter for screening mammogram for malignant neoplasm of breast: Secondary | ICD-10-CM

## 2016-08-02 DIAGNOSIS — R51 Headache: Principal | ICD-10-CM

## 2016-08-02 DIAGNOSIS — R519 Headache, unspecified: Secondary | ICD-10-CM

## 2016-08-11 ENCOUNTER — Ambulatory Visit
Admission: RE | Admit: 2016-08-11 | Discharge: 2016-08-11 | Disposition: A | Discharge status: Home / Self Care | Payer: Medicare Other | Source: Ambulatory Visit | Attending: Family Medicine | Admitting: Family Medicine

## 2016-08-11 DIAGNOSIS — J358 Other chronic diseases of tonsils and adenoids: Secondary | ICD-10-CM | POA: Insufficient documentation

## 2016-08-11 DIAGNOSIS — R51 Headache: Secondary | ICD-10-CM | POA: Insufficient documentation

## 2016-08-11 DIAGNOSIS — R519 Headache, unspecified: Secondary | ICD-10-CM

## 2016-08-11 DIAGNOSIS — H748X3 Other specified disorders of middle ear and mastoid, bilateral: Secondary | ICD-10-CM | POA: Insufficient documentation

## 2016-08-25 ENCOUNTER — Ambulatory Visit: Payer: Medicare Other | Attending: Family Medicine

## 2017-01-11 ENCOUNTER — Ambulatory Visit
Admission: RE | Admit: 2017-01-11 | Discharge: 2017-01-11 | Disposition: A | Discharge status: Home / Self Care | Payer: Medicare Other | Source: Ambulatory Visit | Attending: Internal Medicine | Admitting: Internal Medicine

## 2017-01-11 ENCOUNTER — Encounter
Admission: RE | Disposition: A | Discharge status: Home / Self Care | Payer: Self-pay | Source: Ambulatory Visit | Attending: Internal Medicine

## 2017-01-11 DIAGNOSIS — E079 Disorder of thyroid, unspecified: Secondary | ICD-10-CM | POA: Insufficient documentation

## 2017-01-11 DIAGNOSIS — Z7982 Long term (current) use of aspirin: Secondary | ICD-10-CM | POA: Insufficient documentation

## 2017-01-11 DIAGNOSIS — Z79899 Other long term (current) drug therapy: Secondary | ICD-10-CM | POA: Insufficient documentation

## 2017-01-11 DIAGNOSIS — F329 Major depressive disorder, single episode, unspecified: Secondary | ICD-10-CM | POA: Diagnosis not present

## 2017-01-11 DIAGNOSIS — I10 Essential (primary) hypertension: Secondary | ICD-10-CM | POA: Diagnosis not present

## 2017-01-11 DIAGNOSIS — J439 Emphysema, unspecified: Secondary | ICD-10-CM | POA: Diagnosis not present

## 2017-01-11 DIAGNOSIS — E785 Hyperlipidemia, unspecified: Secondary | ICD-10-CM | POA: Insufficient documentation

## 2017-01-11 DIAGNOSIS — I25118 Atherosclerotic heart disease of native coronary artery with other forms of angina pectoris: Secondary | ICD-10-CM | POA: Insufficient documentation

## 2017-01-11 DIAGNOSIS — J449 Chronic obstructive pulmonary disease, unspecified: Secondary | ICD-10-CM | POA: Insufficient documentation

## 2017-01-11 DIAGNOSIS — G4730 Sleep apnea, unspecified: Secondary | ICD-10-CM | POA: Insufficient documentation

## 2017-01-11 DIAGNOSIS — M810 Age-related osteoporosis without current pathological fracture: Secondary | ICD-10-CM | POA: Insufficient documentation

## 2017-01-11 DIAGNOSIS — Z8249 Family history of ischemic heart disease and other diseases of the circulatory system: Secondary | ICD-10-CM | POA: Diagnosis not present

## 2017-01-11 DIAGNOSIS — I2510 Atherosclerotic heart disease of native coronary artery without angina pectoris: Secondary | ICD-10-CM | POA: Diagnosis present

## 2017-01-11 HISTORY — DX: Major depressive disorder, single episode, unspecified: F32.9

## 2017-01-11 HISTORY — PX: LEFT HEART CATH AND CORONARY ANGIOGRAPHY: CATH118249

## 2017-01-11 HISTORY — DX: Gastro-esophageal reflux disease without esophagitis: K21.9

## 2017-01-11 HISTORY — DX: Hypothyroidism, unspecified: E03.9

## 2017-01-11 HISTORY — DX: Anxiety disorder, unspecified: F41.9

## 2017-01-11 HISTORY — DX: Depression, unspecified: F32.A

## 2017-01-11 SURGERY — LEFT HEART CATH AND CORONARY ANGIOGRAPHY
Anesthesia: Moderate Sedation | Laterality: Left

## 2017-01-11 MED ORDER — ACETAMINOPHEN 325 MG PO TABS: 650.0000 mg | ORAL_TABLET | ORAL | Status: DC | PRN

## 2017-01-11 MED ORDER — FENTANYL CITRATE (PF) 100 MCG/2ML IJ SOLN
INTRAMUSCULAR | Status: AC
  Filled 2017-01-11: qty 2

## 2017-01-11 MED ORDER — ONDANSETRON HCL 4 MG/2ML IJ SOLN: 4.0000 mg | Freq: Four times a day (QID) | INTRAMUSCULAR | Status: DC | PRN

## 2017-01-11 MED ORDER — SODIUM CHLORIDE 0.9 % WEIGHT BASED INFUSION: 1.0000 mL/kg/h | INTRAVENOUS | Status: DC

## 2017-01-11 MED ORDER — ASPIRIN 81 MG PO CHEW: 81.0000 mg | CHEWABLE_TABLET | ORAL | Status: DC

## 2017-01-11 MED ORDER — SODIUM CHLORIDE 0.9% FLUSH: 3.0000 mL | Freq: Two times a day (BID) | INTRAVENOUS | Status: DC

## 2017-01-11 MED ORDER — SODIUM CHLORIDE 0.9% FLUSH: 3.0000 mL | INTRAVENOUS | Status: DC | PRN

## 2017-01-11 MED ORDER — SODIUM CHLORIDE 0.9 % WEIGHT BASED INFUSION
3.0000 mL/kg/h | INTRAVENOUS | Status: DC
Start: 2017-01-12 — End: 2017-01-11

## 2017-01-11 MED ORDER — MIDAZOLAM HCL 2 MG/2ML IJ SOLN
INTRAMUSCULAR | Status: AC
  Filled 2017-01-11: qty 2

## 2017-01-11 MED ORDER — FENTANYL CITRATE (PF) 100 MCG/2ML IJ SOLN
INTRAMUSCULAR | Status: DC | PRN
  Administered 2017-01-11: 50 ug via INTRAVENOUS

## 2017-01-11 MED ORDER — SODIUM CHLORIDE 0.9 % IV SOLN: 250.0000 mL | INTRAVENOUS | Status: DC | PRN

## 2017-01-11 MED ORDER — SODIUM CHLORIDE 0.9 % IV SOLN
250.0000 mL | INTRAVENOUS | Status: DC | PRN
  Administered 2017-01-11: 250 mL via INTRAVENOUS

## 2017-01-11 MED ORDER — IOPAMIDOL (ISOVUE-300) INJECTION 61%
INTRAVENOUS | Status: DC | PRN
  Administered 2017-01-11: 100 mL via INTRA_ARTERIAL

## 2017-01-11 SURGICAL SUPPLY — 8 items
CATH 5FR PIGTAIL DIAGNOSTIC (CATHETERS) ×3 IMPLANT
CATH INFINITI 5FR JL4 (CATHETERS) ×3 IMPLANT
CATH INFINITI JR4 5F (CATHETERS) ×3 IMPLANT
KIT MANI 3VAL PERCEP (MISCELLANEOUS) ×3 IMPLANT
NEEDLE PERC 18GX7CM (NEEDLE) ×3 IMPLANT
PACK CARDIAC CATH (CUSTOM PROCEDURE TRAY) ×3 IMPLANT
SHEATH PINNACLE 5F 10CM (SHEATH) ×3 IMPLANT
WIRE EMERALD 3MM-J .035X150CM (WIRE) ×3 IMPLANT

## 2017-01-12 ENCOUNTER — Encounter: Payer: Self-pay | Admitting: Internal Medicine

## 2017-01-27 IMAGING — CT CT HEAD W/O CM
4 series · 17 of 47 positions shown, 19 images · non-contrast
Comparison: None.

CLINICAL DATA: Non-intractable headache.

EXAM:
CT HEAD WITHOUT CONTRAST
TECHNIQUE: Contiguous axial images were obtained from the base of the skull
through the vertex without intravenous contrast.

[Series 2: head wo · axial · 0.41mm/px · z∈[+636,+746]mm · 7 of 30 slices shown, 9 images]
[im 4/30  brain]
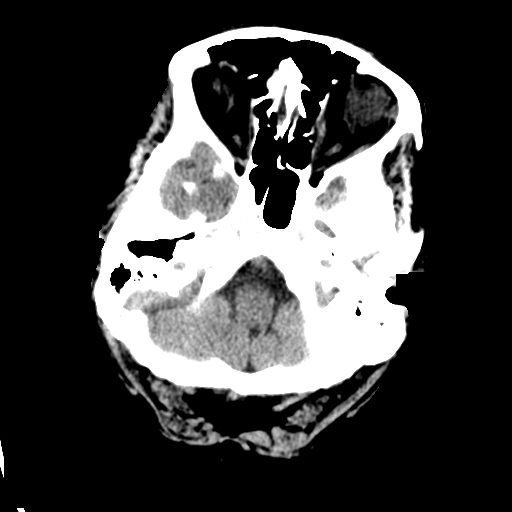
[im 4/30  bone]
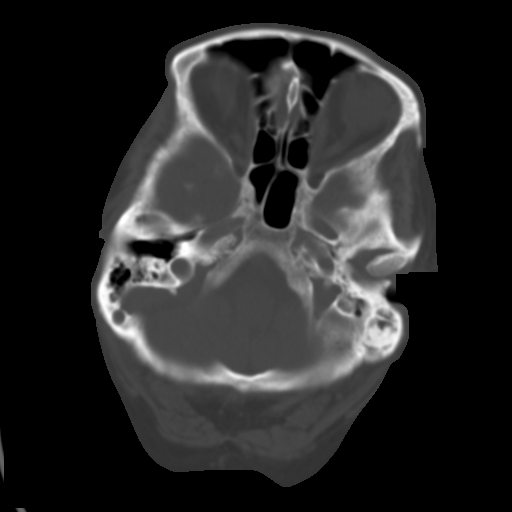
[im 8/30  brain]
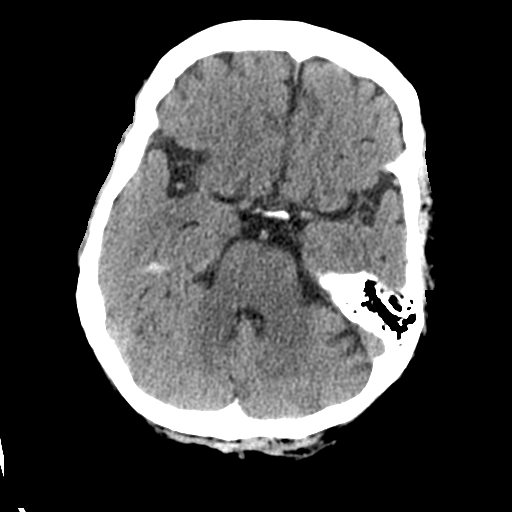
[im 11/30  brain]
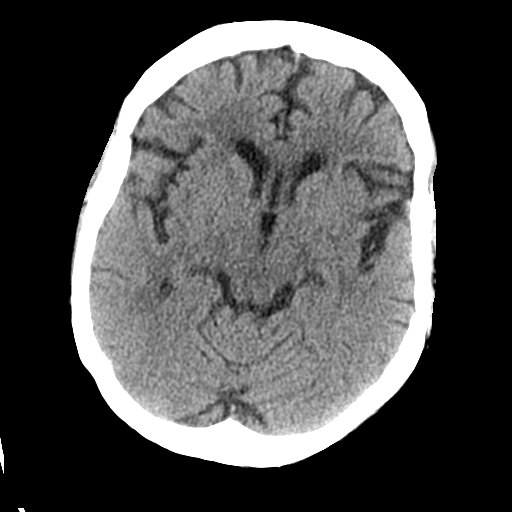
[im 15/30  brain]
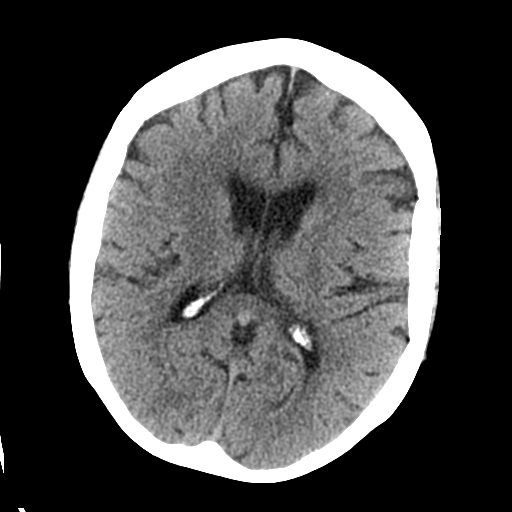
[im 19/30  brain]
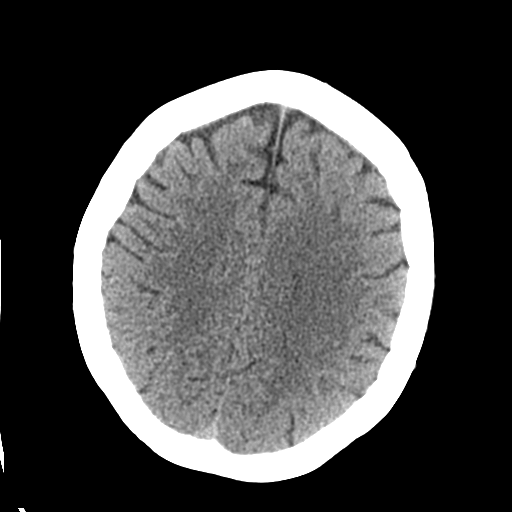
[im 19/30  bone]
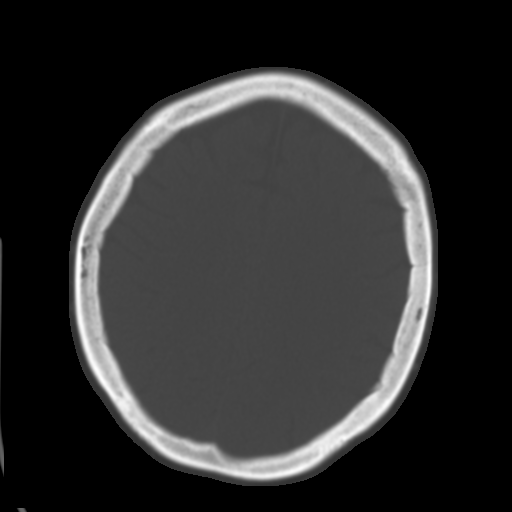
[im 22/30  brain]
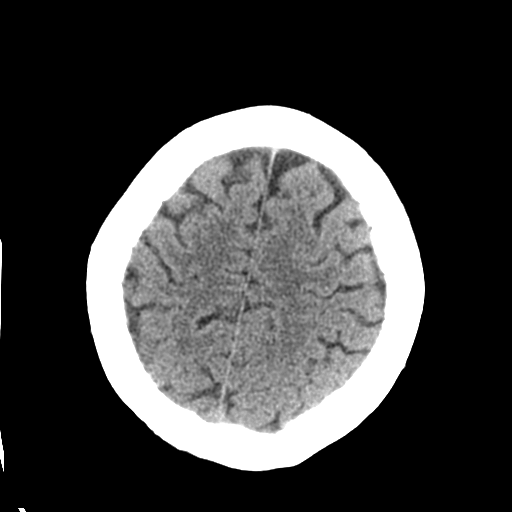
[im 26/30  brain]
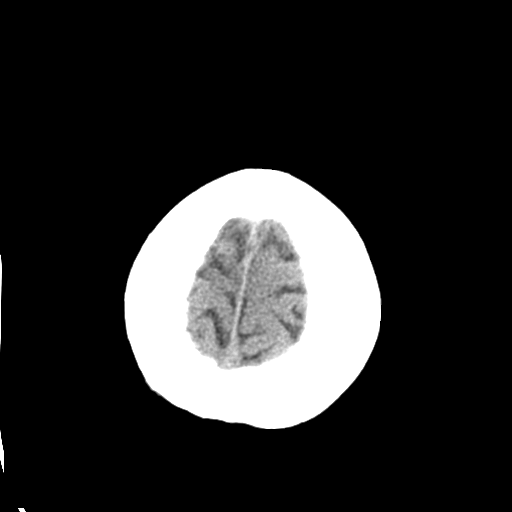

[Series 3: head bone · axial · 0.41mm/px · z∈[+635,+685]mm · 4 of 73 slices shown]
[im 8/73  bone]
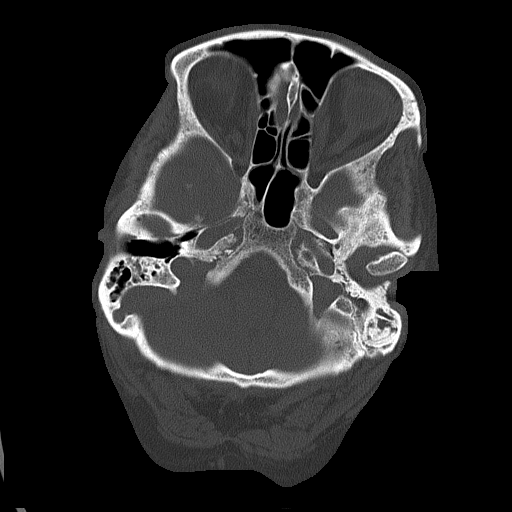
[im 15/73  bone]
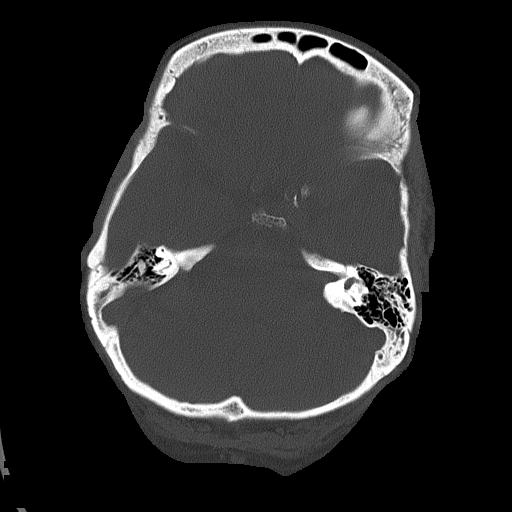
[im 22/73  bone]
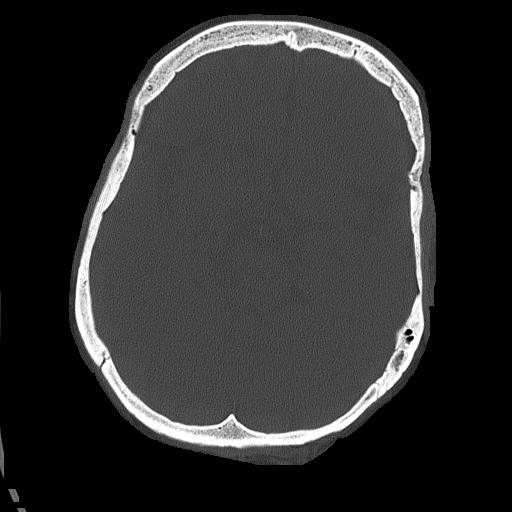
[im 33/73  bone]
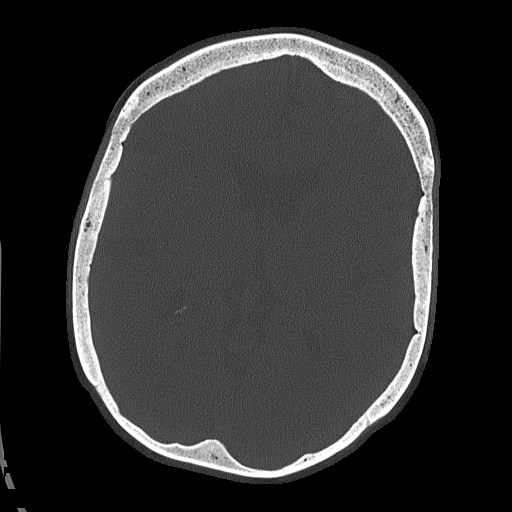

[Series 4: coronal soft tissue · coronal · 0.30mm/px · 3 of 63 slices shown]
[im 21/63  brain]
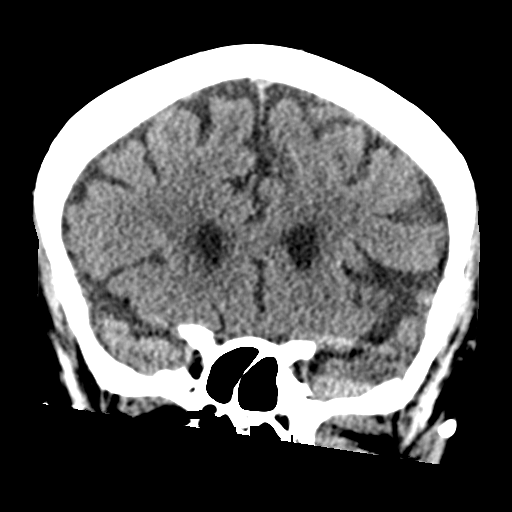
[im 28/63  brain]
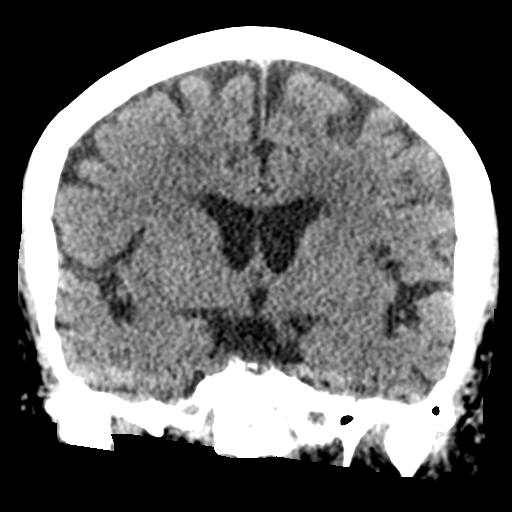
[im 35/63  brain]
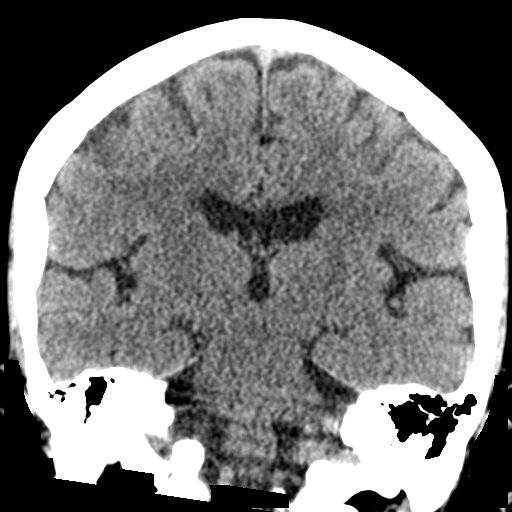

[Series 5: sagittal soft tissue · sagittal · 0.30mm/px · 3 of 52 slices shown]
[im 18/52  brain]
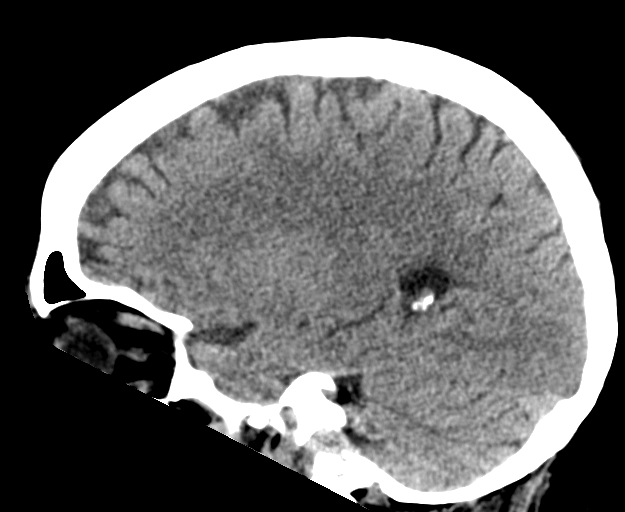
[im 26/52  brain]
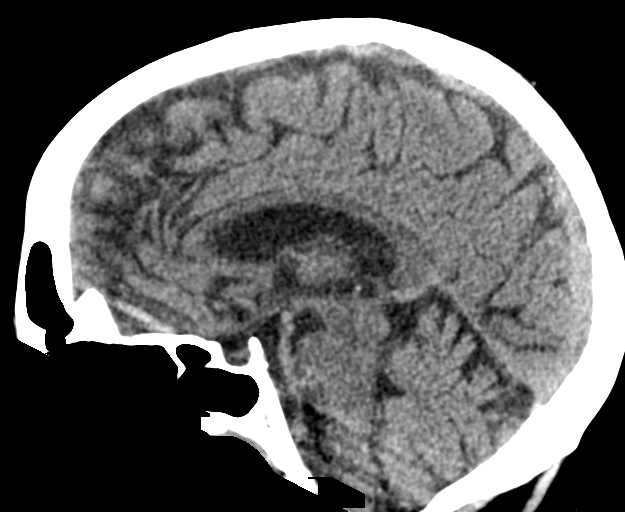
[im 35/52  brain]
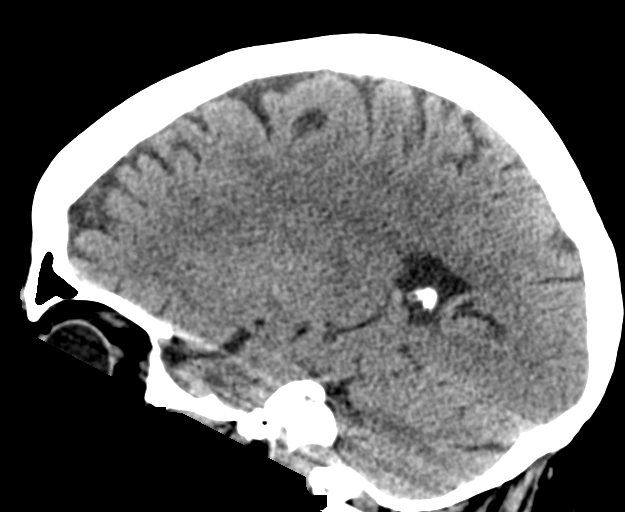

[17 of 47 positions shown; findings below may reference images not displayed]

FINDINGS: Brain: No evidence of acute infarction, hemorrhage, hydrocephalus,
extra-axial collection or mass lesion/mass effect.

Vascular: Atherosclerotic calcification.

Skull: Negative

Sinuses/Orbits: Symmetric adenoid thickening with chronic mild
opacification of bilateral mastoid air cells.
IMPRESSION: 1. Unremarkable intracranial imaging.
2. Adenoid thickening and chronic mild mastoid opacification.

## 2017-03-02 ENCOUNTER — Encounter: Payer: Self-pay | Admitting: *Deleted

## 2017-03-02 NOTE — Discharge Instructions (Signed)
Cataract Surgery, Care After °Refer to this sheet in the next few weeks. These instructions provide you with information about caring for yourself after your procedure. Your health care provider may also give you more specific instructions. Your treatment has been planned according to current medical practices, but problems sometimes occur. Call your health care provider if you have any problems or questions after your procedure. °What can I expect after the procedure? °After the procedure, it is common to have: °· Itching. °· Discomfort. °· Fluid discharge. °· Sensitivity to light and to touch. °· Bruising. °Follow these instructions at home: °Eye Care  °· Check your eye every day for signs of infection. Watch for: °¨ Redness, swelling, or pain. °¨ Fluid, blood, or pus. °¨ Warmth. °¨ Bad smell. °Activity  °· Avoid strenuous activities, such as playing contact sports, for as long as told by your health care provider. °· Do not drive or operate heavy machinery until your health care provider approves. °· Do not bend or lift heavy objects . Bending increases pressure in the eye. You can walk, climb stairs, and do light household chores. °· Ask your health care provider when you can return to work. If you work in a dusty environment, you may be advised to wear protective eyewear for a period of time. °General instructions  °· Take or apply over-the-counter and prescription medicines only as told by your health care provider. This includes eye drops. °· Do not touch or rub your eyes. °· If you were given a protective shield, wear it as told by your health care provider. If you were not given a protective shield, wear sunglasses as told by your health care provider to protect your eyes. °· Keep the area around your eye clean and dry. Avoid swimming or allowing water to hit you directly in the face while showering until told by your health care provider. Keep soap and shampoo out of your eyes. °· Do not put a contact lens  into the affected eye or eyes until your health care provider approves. °· Keep all follow-up visits as told by your health care provider. This is important. °Contact a health care provider if: ° °· You have increased bruising around your eye. °· You have pain that is not helped with medicine. °· You have a fever. °· You have redness, swelling, or pain in your eye. °· You have fluid, blood, or pus coming from your incision. °· Your vision gets worse. °Get help right away if: °· You have sudden vision loss. °This information is not intended to replace advice given to you by your health care provider. Make sure you discuss any questions you have with your health care provider. °Document Released: 05/06/2005 Document Revised: 02/25/2016 Document Reviewed: 08/27/2015 °Elsevier Interactive Patient Education © 2017 [employer] Inc[employer] ° ° ° ° °General Anesthesia, Adult, Care After °These instructions provide you with information about caring for yourself after your procedure. Your health care provider may also give you more specific instructions. Your treatment has been planned according to current medical practices, but problems sometimes occur. Call your health care provider if you have any problems or questions after your procedure. °What can I expect after the procedure? °After the procedure, it is common to have: °· Vomiting. °· A sore throat. °· Mental slowness. °It is common to feel: °· Nauseous. °· Cold or shivery. °· Sleepy. °· Tired. °· Sore or achy, even in parts of your body where you did not have surgery. °Follow these instructions at   home: °For at least 24 hours after the procedure:  °· Do not: °¨ Participate in activities where you could fall or become injured. °¨ Drive. °¨ Use heavy machinery. °¨ Drink alcohol. °¨ Take sleeping pills or medicines that cause drowsiness. °¨ Make important decisions or sign legal documents. °¨ Take care of children on your own. °· Rest. °Eating and drinking  °· If you vomit, drink  water, juice, or soup when you can drink without vomiting. °· Drink enough fluid to keep your urine clear or pale yellow. °· Make sure you have little or no nausea before eating solid foods. °· Follow the diet recommended by your health care provider. °General instructions  °· Have a responsible adult stay with you until you are awake and alert. °· Return to your normal activities as told by your health care provider. Ask your health care provider what activities are safe for you. °· Take over-the-counter and prescription medicines only as told by your health care provider. °· If you smoke, do not smoke without supervision. °· Keep all follow-up visits as told by your health care provider. This is important. °Contact a health care provider if: °· You continue to have nausea or vomiting at home, and medicines are not helpful. °· You cannot drink fluids or start eating again. °· You cannot urinate after 8-12 hours. °· You develop a skin rash. °· You have fever. °· You have increasing redness at the site of your procedure. °Get help right away if: °· You have difficulty breathing. °· You have chest pain. °· You have unexpected bleeding. °· You feel that you are having a life-threatening or urgent problem. °This information is not intended to replace advice given to you by your health care provider. Make sure you discuss any questions you have with your health care provider. °Document Released: 01/23/2001 Document Revised: 03/21/2016 Document Reviewed: 10/01/2015 °Elsevier Interactive Patient Education © 2017 Elsevier Inc. ° °

## 2017-03-07 ENCOUNTER — Encounter
Admission: RE | Disposition: A | Discharge status: Home / Self Care | Payer: Self-pay | Source: Ambulatory Visit | Attending: Ophthalmology

## 2017-03-07 ENCOUNTER — Ambulatory Visit
Admission: RE | Admit: 2017-03-07 | Discharge: 2017-03-07 | Disposition: A | Discharge status: Home / Self Care | Payer: Medicare Other | Source: Ambulatory Visit | Attending: Ophthalmology | Admitting: Ophthalmology

## 2017-03-07 ENCOUNTER — Ambulatory Visit: Payer: Medicare Other | Admitting: Anesthesiology

## 2017-03-07 DIAGNOSIS — H2511 Age-related nuclear cataract, right eye: Secondary | ICD-10-CM | POA: Diagnosis not present

## 2017-03-07 HISTORY — DX: Chronic obstructive pulmonary disease, unspecified: J44.9

## 2017-03-07 HISTORY — DX: Presence of dental prosthetic device (complete) (partial): Z97.2

## 2017-03-07 HISTORY — PX: CATARACT EXTRACTION W/PHACO: SHX586

## 2017-03-07 HISTORY — DX: Headache, unspecified: R51.9

## 2017-03-07 HISTORY — DX: Headache: R51

## 2017-03-07 HISTORY — DX: Unspecified osteoarthritis, unspecified site: M19.90

## 2017-03-07 SURGERY — PHACOEMULSIFICATION, CATARACT, WITH IOL INSERTION
Anesthesia: Monitor Anesthesia Care | Laterality: Right | Wound class: Clean

## 2017-03-07 MED ORDER — MIDAZOLAM HCL 2 MG/2ML IJ SOLN
INTRAMUSCULAR | Status: DC | PRN
  Administered 2017-03-07: 1 mg via INTRAVENOUS
  Administered 2017-03-07: 2 mg via INTRAVENOUS
  Administered 2017-03-07: 1 mg via INTRAVENOUS

## 2017-03-07 MED ORDER — SODIUM HYALURONATE 10 MG/ML IO SOLN
INTRAOCULAR | Status: DC | PRN
  Administered 2017-03-07: 0.55 mL via INTRAOCULAR

## 2017-03-07 MED ORDER — KETOROLAC TROMETHAMINE 30 MG/ML IJ SOLN
INTRAMUSCULAR | Status: DC | PRN
  Administered 2017-03-07: 30 mg via INTRAVENOUS

## 2017-03-07 MED ORDER — FENTANYL CITRATE (PF) 100 MCG/2ML IJ SOLN
INTRAMUSCULAR | Status: DC | PRN
  Administered 2017-03-07 (×2): 50 ug via INTRAVENOUS

## 2017-03-07 MED ORDER — EPINEPHRINE PF 1 MG/ML IJ SOLN
INTRAOCULAR | Status: DC | PRN
  Administered 2017-03-07: 90 mL via OPHTHALMIC

## 2017-03-07 MED ORDER — LIDOCAINE HCL (PF) 2 % IJ SOLN
INTRAOCULAR | Status: DC | PRN
  Administered 2017-03-07: 1 mL via INTRAOCULAR

## 2017-03-07 MED ORDER — LACTATED RINGERS IV SOLN: 1000.0000 mL | INTRAVENOUS | Status: DC

## 2017-03-07 MED ORDER — ARMC OPHTHALMIC DILATING DROPS
1.0000 "application " | OPHTHALMIC | Status: DC | PRN
  Administered 2017-03-07 (×3): 1 via OPHTHALMIC

## 2017-03-07 MED ORDER — SODIUM HYALURONATE 23 MG/ML IO SOLN
INTRAOCULAR | Status: DC | PRN
  Administered 2017-03-07: 0.6 mL via INTRAOCULAR

## 2017-03-07 MED ORDER — MOXIFLOXACIN HCL 0.5 % OP SOLN
OPHTHALMIC | Status: DC | PRN
  Administered 2017-03-07: 0.2 mL

## 2017-03-07 SURGICAL SUPPLY — 17 items
CANNULA ANT/CHMB 27G (MISCELLANEOUS) ×1 IMPLANT
CANNULA ANT/CHMB 27GA (MISCELLANEOUS) ×3 IMPLANT
DISSECTOR HYDRO NUCLEUS 50X22 (MISCELLANEOUS) ×3 IMPLANT
GLOVE BIO SURGEON STRL SZ8 (GLOVE) ×3 IMPLANT
GLOVE SURG LX 7.5 STRW (GLOVE) ×2
GLOVE SURG LX STRL 7.5 STRW (GLOVE) ×1 IMPLANT
GOWN STRL REUS W/ TWL LRG LVL3 (GOWN DISPOSABLE) ×2 IMPLANT
GOWN STRL REUS W/TWL LRG LVL3 (GOWN DISPOSABLE) ×4
LENS IOL TECNIS ITEC 22.5 (Intraocular Lens) ×3 IMPLANT
MARKER SKIN DUAL TIP RULER LAB (MISCELLANEOUS) ×3 IMPLANT
PACK CATARACT (MISCELLANEOUS) ×3 IMPLANT
PACK DR. KING ARMS (PACKS) ×3 IMPLANT
PACK EYE AFTER SURG (MISCELLANEOUS) ×3 IMPLANT
SYR 3ML LL SCALE MARK (SYRINGE) ×3 IMPLANT
SYR TB 1ML LUER SLIP (SYRINGE) ×3 IMPLANT
WATER STERILE IRR 500ML POUR (IV SOLUTION) ×3 IMPLANT
WIPE NON LINTING 3.25X3.25 (MISCELLANEOUS) ×3 IMPLANT

## 2017-03-07 NOTE — Anesthesia Preprocedure Evaluation (Addendum)
Anesthesia Evaluation  Patient identified by MRN, date of birth, ID band Patient awake    Reviewed: Allergy & Precautions, NPO status , Patient's Chart, lab work & pertinent test results, reviewed documented beta blocker date and time   Airway Mallampati: II  TM Distance: >3 FB Neck ROM: Full    Dental  (+) Upper Dentures, Edentulous Lower   Pulmonary COPD, former smoker,    Pulmonary exam normal breath sounds clear to auscultation       Cardiovascular hypertension, + CAD (Mild CAD by [facility] 2018)  Normal cardiovascular exam Rhythm:Regular Rate:Normal     Neuro/Psych  Headaches, PSYCHIATRIC DISORDERS Anxiety Depression    GI/Hepatic Neg liver ROS, GERD  ,  Endo/Other  Hypothyroidism   Renal/GU negative Renal ROS  negative genitourinary   Musculoskeletal  (+) Arthritis ,   Abdominal Normal abdominal exam  (+)  Abdomen: soft. Bowel sounds: normal.  Peds  Hematology negative hematology ROS (+)   Anesthesia Other Findings   Reproductive/Obstetrics negative OB ROS                            Anesthesia Physical Anesthesia Plan  ASA: III  Anesthesia Plan: MAC   Post-op Pain Management:    Induction:   Airway Management Planned: Nasal Cannula  Additional Equipment: None  Intra-op Plan:   Post-operative Plan:   Informed Consent: I have reviewed the patients History and Physical, chart, labs and discussed the procedure including the risks, benefits and alternatives for the proposed anesthesia with the patient or authorized representative who has indicated his/her understanding and acceptance.     Plan Discussed with: CRNA, Anesthesiologist and Surgeon  Anesthesia Plan Comments:        Anesthesia Quick Evaluation

## 2017-03-07 NOTE — Op Note (Signed)
OPERATIVE NOTE  Maria Warren [id number] 03/07/2017   PREOPERATIVE DIAGNOSIS:  Nuclear sclerotic cataract right eye.  H25.11   POSTOPERATIVE DIAGNOSIS:    Nuclear sclerotic cataract right eye.     PROCEDURE:  Phacoemusification with posterior chamber intraocular lens placement of the right eye   LENS:   Implant Name Type Inv. Item Serial No. Manufacturer Lot No. LRB No. Used  LENS IOL DIOP 22.5 - [id number] Intraocular Lens LENS IOL DIOP 22.5 [id number]   Right 1       PCB00 +22.5   ULTRASOUND TIME: 0 minutes 35 seconds.  CDE 6.38   SURGEON:  Benay Pillow, MD, MPH  ANESTHESIOLOGIST: Anesthesiologist: Lavonna Monarch, MD CRNA: Mayme Genta, CRNA   ANESTHESIA:  Topical with tetracaine drops augmented with 1% preservative-free intracameral lidocaine.  ESTIMATED BLOOD LOSS: less than 1 mL.   COMPLICATIONS:  None.   DESCRIPTION OF PROCEDURE:  The patient was identified in the holding room and transported to the operating room and placed in the supine position under the operating microscope.  The right eye was identified as the operative eye and it was prepped and draped in the usual sterile ophthalmic fashion.   A 1.0 millimeter clear-corneal paracentesis was made at the 10:30 position. 0.5 ml of preservative-free 1% lidocaine with epinephrine was injected into the anterior chamber.  The anterior chamber was filled with Healon 5 viscoelastic.  A 2.4 millimeter keratome was used to make a near-clear corneal incision at the 8:00 position.  A curvilinear capsulorrhexis was made with a cystotome and capsulorrhexis forceps.  Balanced salt solution was used to hydrodissect and hydrodelineate the nucleus.   Phacoemulsification was then used in stop and chop fashion to remove the lens nucleus and epinucleus.  The remaining cortex was then removed using the irrigation and aspiration handpiece. Healon was then placed into the capsular bag to distend it for lens placement.  A lens was then  injected into the capsular bag.    A gonioprism was placed on the surface of the eye and the nasal angle was well visualized, open to SS with moderate iris processes. The remaining viscoelastic was aspirated.   Wounds were hydrated with balanced salt solution.  The anterior chamber was inflated to a physiologic pressure with balanced salt solution.   Intracameral vigamox 0.1 mL undiluted was injected into the eye and a drop placed onto the ocular surface.  No wound leaks were noted.  The patient was taken to the recovery room in stable condition without complications of anesthesia or surgery  Benay Pillow 03/07/2017, 10:28 AM

## 2017-03-07 NOTE — Transfer of Care (Signed)
Immediate Anesthesia Transfer of Care Note  Patient: Maria Warren  Procedure(s) Performed: Procedure(s): CATARACT EXTRACTION PHACO AND INTRAOCULAR LENS PLACEMENT (IOC)  Right (Right)  Patient Location: PACU  Anesthesia Type: MAC  Level of Consciousness: awake, alert  and patient cooperative  Airway and Oxygen Therapy: Patient Spontanous Breathing and Patient connected to supplemental oxygen  Post-op Assessment: Post-op Vital signs reviewed, Patient's Cardiovascular Status Stable, Respiratory Function Stable, Patent Airway and No signs of Nausea or vomiting  Post-op Vital Signs: Reviewed and stable  Complications: No apparent anesthesia complications

## 2017-03-07 NOTE — Anesthesia Procedure Notes (Signed)
Procedure Name: MAC Performed by: Mayme Genta Pre-anesthesia Checklist: Patient identified, Emergency Drugs available, Suction available, Timeout performed and Patient being monitored Patient Re-evaluated:Patient Re-evaluated prior to inductionOxygen Delivery Method: Nasal cannula Placement Confirmation: positive ETCO2

## 2017-03-07 NOTE — H&P (Signed)
The History and Physical notes are on paper, have been signed, and are to be scanned.   I have examined the patient and there are no changes to the H&P.   Benay Pillow 03/07/2017 9:39 AM

## 2017-03-07 NOTE — Anesthesia Postprocedure Evaluation (Signed)
Anesthesia Post Note  Patient: Maria Warren  Procedure(s) Performed: Procedure(s) (LRB): CATARACT EXTRACTION PHACO AND INTRAOCULAR LENS PLACEMENT (IOC)  Right (Right)  Patient location during evaluation: PACU Anesthesia Type: MAC Level of consciousness: awake Pain management: pain level controlled Vital Signs Assessment: post-procedure vital signs reviewed and stable Respiratory status: spontaneous breathing Cardiovascular status: blood pressure returned to baseline Postop Assessment: no headache Anesthetic complications: no    Lavonna Monarch

## 2017-03-08 ENCOUNTER — Encounter: Payer: Self-pay | Admitting: Ophthalmology

## 2017-04-07 NOTE — Discharge Instructions (Signed)

## 2017-04-11 ENCOUNTER — Ambulatory Visit
Admission: RE | Admit: 2017-04-11 | Discharge: 2017-04-11 | Disposition: A | Discharge status: Home / Self Care | Payer: Medicare Other | Source: Ambulatory Visit | Attending: Ophthalmology | Admitting: Ophthalmology

## 2017-04-11 ENCOUNTER — Ambulatory Visit: Payer: Medicare Other | Admitting: Anesthesiology

## 2017-04-11 ENCOUNTER — Encounter
Admission: RE | Disposition: A | Discharge status: Home / Self Care | Payer: Self-pay | Source: Ambulatory Visit | Attending: Ophthalmology

## 2017-04-11 DIAGNOSIS — F419 Anxiety disorder, unspecified: Secondary | ICD-10-CM | POA: Insufficient documentation

## 2017-04-11 DIAGNOSIS — I2510 Atherosclerotic heart disease of native coronary artery without angina pectoris: Secondary | ICD-10-CM | POA: Diagnosis not present

## 2017-04-11 DIAGNOSIS — Z79899 Other long term (current) drug therapy: Secondary | ICD-10-CM | POA: Insufficient documentation

## 2017-04-11 DIAGNOSIS — F17200 Nicotine dependence, unspecified, uncomplicated: Secondary | ICD-10-CM | POA: Diagnosis not present

## 2017-04-11 DIAGNOSIS — F329 Major depressive disorder, single episode, unspecified: Secondary | ICD-10-CM | POA: Diagnosis not present

## 2017-04-11 DIAGNOSIS — E7800 Pure hypercholesterolemia, unspecified: Secondary | ICD-10-CM | POA: Diagnosis not present

## 2017-04-11 DIAGNOSIS — Z7982 Long term (current) use of aspirin: Secondary | ICD-10-CM | POA: Diagnosis not present

## 2017-04-11 DIAGNOSIS — E039 Hypothyroidism, unspecified: Secondary | ICD-10-CM | POA: Insufficient documentation

## 2017-04-11 DIAGNOSIS — H2512 Age-related nuclear cataract, left eye: Secondary | ICD-10-CM | POA: Insufficient documentation

## 2017-04-11 DIAGNOSIS — K219 Gastro-esophageal reflux disease without esophagitis: Secondary | ICD-10-CM | POA: Diagnosis not present

## 2017-04-11 DIAGNOSIS — Z7951 Long term (current) use of inhaled steroids: Secondary | ICD-10-CM | POA: Insufficient documentation

## 2017-04-11 DIAGNOSIS — J449 Chronic obstructive pulmonary disease, unspecified: Secondary | ICD-10-CM | POA: Diagnosis not present

## 2017-04-11 DIAGNOSIS — I10 Essential (primary) hypertension: Secondary | ICD-10-CM | POA: Insufficient documentation

## 2017-04-11 HISTORY — PX: CATARACT EXTRACTION W/PHACO: SHX586

## 2017-04-11 SURGERY — PHACOEMULSIFICATION, CATARACT, WITH IOL INSERTION
Anesthesia: Monitor Anesthesia Care | Laterality: Left | Wound class: Clean

## 2017-04-11 MED ORDER — BALANCED SALT IO SOLN
INTRAOCULAR | Status: DC | PRN
  Administered 2017-04-11: 1 mL via INTRAOCULAR

## 2017-04-11 MED ORDER — MIDAZOLAM HCL 2 MG/2ML IJ SOLN
INTRAMUSCULAR | Status: DC | PRN
  Administered 2017-04-11: 2 mg via INTRAVENOUS

## 2017-04-11 MED ORDER — FENTANYL CITRATE (PF) 100 MCG/2ML IJ SOLN
INTRAMUSCULAR | Status: DC | PRN
  Administered 2017-04-11: 50 ug via INTRAVENOUS

## 2017-04-11 MED ORDER — EPINEPHRINE PF 1 MG/ML IJ SOLN
INTRAMUSCULAR | Status: DC | PRN
  Administered 2017-04-11: 95 mL via OPHTHALMIC

## 2017-04-11 MED ORDER — MOXIFLOXACIN HCL 0.5 % OP SOLN
OPHTHALMIC | Status: DC | PRN
  Administered 2017-04-11: 0.2 mL via OPHTHALMIC

## 2017-04-11 MED ORDER — ONDANSETRON HCL 4 MG/2ML IJ SOLN: 4.0000 mg | Freq: Once | INTRAMUSCULAR | Status: DC | PRN

## 2017-04-11 MED ORDER — ACETAMINOPHEN 325 MG PO TABS
325.0000 mg | ORAL_TABLET | ORAL | Status: DC | PRN
  Administered 2017-04-11: 650 mg via ORAL

## 2017-04-11 MED ORDER — SODIUM HYALURONATE 23 MG/ML IO SOLN
INTRAOCULAR | Status: DC | PRN
  Administered 2017-04-11: 0.6 mL via INTRAOCULAR

## 2017-04-11 MED ORDER — SODIUM HYALURONATE 10 MG/ML IO SOLN
INTRAOCULAR | Status: DC | PRN
  Administered 2017-04-11: 0.55 mL via INTRAOCULAR

## 2017-04-11 MED ORDER — ARMC OPHTHALMIC DILATING DROPS
1.0000 "application " | OPHTHALMIC | Status: DC | PRN
  Administered 2017-04-11 (×3): 1 via OPHTHALMIC

## 2017-04-11 MED ORDER — ACETAMINOPHEN 160 MG/5ML PO SOLN: 325.0000 mg | ORAL | Status: DC | PRN

## 2017-04-11 SURGICAL SUPPLY — 16 items
CANNULA ANT/CHMB 27GA (MISCELLANEOUS) ×3 IMPLANT
DISSECTOR HYDRO NUCLEUS 50X22 (MISCELLANEOUS) ×3 IMPLANT
GLOVE BIO SURGEON STRL SZ8 (GLOVE) ×3 IMPLANT
GLOVE SURG LX 7.5 STRW (GLOVE) ×2
GLOVE SURG LX STRL 7.5 STRW (GLOVE) ×1 IMPLANT
GOWN STRL REUS W/ TWL LRG LVL3 (GOWN DISPOSABLE) ×2 IMPLANT
GOWN STRL REUS W/TWL LRG LVL3 (GOWN DISPOSABLE) ×4
LENS IOL TECNIS ITEC 23.0 (Intraocular Lens) ×3 IMPLANT
MARKER SKIN DUAL TIP RULER LAB (MISCELLANEOUS) ×3 IMPLANT
PACK CATARACT (MISCELLANEOUS) ×3 IMPLANT
PACK DR. KING ARMS (PACKS) ×3 IMPLANT
PACK EYE AFTER SURG (MISCELLANEOUS) ×3 IMPLANT
SYR 3ML LL SCALE MARK (SYRINGE) ×3 IMPLANT
SYR TB 1ML LUER SLIP (SYRINGE) ×3 IMPLANT
WATER STERILE IRR 500ML POUR (IV SOLUTION) ×3 IMPLANT
WIPE NON LINTING 3.25X3.25 (MISCELLANEOUS) ×3 IMPLANT

## 2017-04-11 NOTE — H&P (Signed)
The History and Physical notes are on paper, have been signed, and are to be scanned.   I have examined the patient and there are no changes to the H&P.   Benay Pillow 04/11/2017 8:52 AM

## 2017-04-11 NOTE — Anesthesia Postprocedure Evaluation (Signed)
Anesthesia Post Note  Patient: Maria Warren  Procedure(s) Performed: Procedure(s) (LRB): CATARACT EXTRACTION PHACO AND INTRAOCULAR LENS PLACEMENT (IOC)  left (Left)  Patient location during evaluation: PACU Anesthesia Type: MAC Level of consciousness: awake and alert Pain management: pain level controlled Vital Signs Assessment: post-procedure vital signs reviewed and stable Respiratory status: spontaneous breathing, nonlabored ventilation and respiratory function stable Cardiovascular status: stable Postop Assessment: no signs of nausea or vomiting Anesthetic complications: no    Veda Canning

## 2017-04-11 NOTE — Anesthesia Preprocedure Evaluation (Addendum)
Anesthesia Evaluation  Patient identified by MRN, date of birth, ID band Patient awake    Reviewed: Allergy & Precautions, NPO status , Patient's Chart, lab work & pertinent test results, reviewed documented beta blocker date and time   Airway Mallampati: II  TM Distance: >3 FB Neck ROM: Full    Dental  (+) Upper Dentures, Edentulous Lower   Pulmonary COPD, Current Smoker,    Pulmonary exam normal breath sounds clear to auscultation       Cardiovascular hypertension, + CAD (Mild CAD by [facility] 2018)  Normal cardiovascular exam Rhythm:Regular Rate:Normal     Neuro/Psych  Headaches, PSYCHIATRIC DISORDERS Anxiety Depression    GI/Hepatic Neg liver ROS, GERD  ,  Endo/Other  Hypothyroidism   Renal/GU negative Renal ROS  negative genitourinary   Musculoskeletal  (+) Arthritis ,   Abdominal Normal abdominal exam  (+)  Abdomen: soft. Bowel sounds: normal.  Peds  Hematology negative hematology ROS (+)   Anesthesia Other Findings   Reproductive/Obstetrics negative OB ROS                            Anesthesia Physical  Anesthesia Plan  ASA: III  Anesthesia Plan: MAC   Post-op Pain Management:    Induction:   PONV Risk Score and Plan:   Airway Management Planned: Nasal Cannula  Additional Equipment: None  Intra-op Plan:   Post-operative Plan:   Informed Consent: I have reviewed the patients History and Physical, chart, labs and discussed the procedure including the risks, benefits and alternatives for the proposed anesthesia with the patient or authorized representative who has indicated his/her understanding and acceptance.     Plan Discussed with: CRNA, Anesthesiologist and Surgeon  Anesthesia Plan Comments:         Anesthesia Quick Evaluation

## 2017-04-11 NOTE — Transfer of Care (Signed)
Immediate Anesthesia Transfer of Care Note  Patient: Maria Warren  Procedure(s) Performed: Procedure(s): CATARACT EXTRACTION PHACO AND INTRAOCULAR LENS PLACEMENT (IOC)  left (Left)  Patient Location: PACU  Anesthesia Type: MAC  Level of Consciousness: awake, alert  and patient cooperative  Airway and Oxygen Therapy: Patient Spontanous Breathing and Patient connected to supplemental oxygen  Post-op Assessment: Post-op Vital signs reviewed, Patient's Cardiovascular Status Stable, Respiratory Function Stable, Patent Airway and No signs of Nausea or vomiting  Post-op Vital Signs: Reviewed and stable  Complications: No apparent anesthesia complications

## 2017-04-11 NOTE — Anesthesia Procedure Notes (Signed)
Procedure Name: MAC Performed by: Tashala Cumbo Pre-anesthesia Checklist: Patient identified, Emergency Drugs available, Suction available, Timeout performed and Patient being monitored Patient Re-evaluated:Patient Re-evaluated prior to inductionOxygen Delivery Method: Nasal cannula Placement Confirmation: positive ETCO2     

## 2017-04-11 NOTE — Op Note (Signed)
OPERATIVE NOTE  Maria Warren [id number] 04/11/2017   PREOPERATIVE DIAGNOSIS:  Nuclear sclerotic cataract left eye.  H25.12   POSTOPERATIVE DIAGNOSIS:    Nuclear sclerotic cataract left eye.     PROCEDURE:  Phacoemusification with posterior chamber intraocular lens placement of the left eye   LENS:   Implant Name Type Inv. Item Serial No. Manufacturer Lot No. LRB No. Used  LENS IOL DIOP 23.0 - [id number] Intraocular Lens LENS IOL DIOP 23.0 [id number] AMO   Left 1       PCB00 +23.0   ULTRASOUND TIME: 0 minutes 48 seconds.  CDE 7.65   SURGEON:  Benay Pillow, MD, MPH   ANESTHESIA:  Topical with tetracaine drops augmented with 1% preservative-free intracameral lidocaine.  ESTIMATED BLOOD LOSS: <1 mL   COMPLICATIONS:  None.   DESCRIPTION OF PROCEDURE:  The patient was identified in the holding room and transported to the operating room and placed in the supine position under the operating microscope.  The left eye was identified as the operative eye and it was prepped and draped in the usual sterile ophthalmic fashion.   A 1.0 millimeter clear-corneal paracentesis was made at the 5:00 position. 0.5 ml of preservative-free 1% lidocaine with epinephrine was injected into the anterior chamber.  The anterior chamber was filled with Healon 5 viscoelastic.  A 2.4 millimeter keratome was used to make a near-clear corneal incision at the 2:00 position.  A curvilinear capsulorrhexis was made with a cystotome and capsulorrhexis forceps.  Balanced salt solution was used to hydrodissect and hydrodelineate the nucleus.   Phacoemulsification was then used in stop and chop fashion to remove the lens nucleus and epinucleus.  The remaining cortex was then removed using the irrigation and aspiration handpiece. Healon was then placed into the capsular bag to distend it for lens placement.  A lens was then injected into the capsular bag.  The remaining viscoelastic was aspirated.   Wounds were hydrated  with balanced salt solution.  The anterior chamber was inflated to a physiologic pressure with balanced salt solution.  There was a prominent Bell's reflex and the patient was not able to maintain primary position of the eye, but the eye was held in proper position with a second instrument as needed.   Intracameral vigamox 0.1 mL undiltued was injected into the eye and a drop placed onto the ocular surface.  No wound leaks were noted.  The patient was taken to the recovery room in stable condition without complications of anesthesia or surgery  Benay Pillow 04/11/2017, 9:58 AM

## 2017-04-12 ENCOUNTER — Encounter: Payer: Self-pay | Admitting: Ophthalmology

## 2017-06-28 DIAGNOSIS — D649 Anemia, unspecified: Secondary | ICD-10-CM | POA: Insufficient documentation

## 2017-06-29 ENCOUNTER — Emergency Department
Admission: EM | Admit: 2017-06-29 | Discharge: 2017-06-30 | Disposition: A | Discharge status: Home / Self Care | Payer: Medicare Other | Attending: Emergency Medicine | Admitting: Emergency Medicine

## 2017-06-29 ENCOUNTER — Encounter: Payer: Self-pay | Admitting: Emergency Medicine

## 2017-06-29 DIAGNOSIS — R195 Other fecal abnormalities: Secondary | ICD-10-CM

## 2017-06-29 DIAGNOSIS — I2510 Atherosclerotic heart disease of native coronary artery without angina pectoris: Secondary | ICD-10-CM | POA: Diagnosis not present

## 2017-06-29 DIAGNOSIS — I10 Essential (primary) hypertension: Secondary | ICD-10-CM | POA: Insufficient documentation

## 2017-06-29 DIAGNOSIS — Z7982 Long term (current) use of aspirin: Secondary | ICD-10-CM | POA: Insufficient documentation

## 2017-06-29 DIAGNOSIS — Z87891 Personal history of nicotine dependence: Secondary | ICD-10-CM | POA: Diagnosis not present

## 2017-06-29 DIAGNOSIS — D649 Anemia, unspecified: Secondary | ICD-10-CM | POA: Diagnosis not present

## 2017-06-29 DIAGNOSIS — R799 Abnormal finding of blood chemistry, unspecified: Secondary | ICD-10-CM | POA: Diagnosis present

## 2017-06-29 DIAGNOSIS — Z79899 Other long term (current) drug therapy: Secondary | ICD-10-CM | POA: Diagnosis not present

## 2017-06-29 DIAGNOSIS — E039 Hypothyroidism, unspecified: Secondary | ICD-10-CM | POA: Insufficient documentation

## 2017-06-29 DIAGNOSIS — J449 Chronic obstructive pulmonary disease, unspecified: Secondary | ICD-10-CM | POA: Insufficient documentation

## 2017-06-29 LAB — HEPATIC FUNCTION PANEL
ALT: 15 U/L (ref 14–54)
AST: 26 U/L (ref 15–41)
Albumin: 3.6 g/dL (ref 3.5–5.0)
Alkaline Phosphatase: 60 U/L (ref 38–126)
BILIRUBIN TOTAL: 0.5 mg/dL (ref 0.3–1.2)
Total Protein: 7 g/dL (ref 6.5–8.1)

## 2017-06-29 LAB — BASIC METABOLIC PANEL
Anion gap: 9 (ref 5–15)
BUN: 8 mg/dL (ref 6–20)
CALCIUM: 6.9 mg/dL — AB (ref 8.9–10.3)
CO2: 29 mmol/L (ref 22–32)
CREATININE: 0.91 mg/dL (ref 0.44–1.00)
Chloride: 101 mmol/L (ref 101–111)
Glucose, Bld: 95 mg/dL (ref 65–99)
Potassium: 4 mmol/L (ref 3.5–5.1)
SODIUM: 139 mmol/L (ref 135–145)

## 2017-06-29 LAB — CBC
HCT: 30 % — ABNORMAL LOW (ref 35.0–47.0)
Hemoglobin: 10 g/dL — ABNORMAL LOW (ref 12.0–16.0)
MCH: 28.3 pg (ref 26.0–34.0)
MCHC: 33.4 g/dL (ref 32.0–36.0)
MCV: 84.8 fL (ref 80.0–100.0)
Platelets: 309 10*3/uL (ref 150–440)
RBC: 3.54 MIL/uL — ABNORMAL LOW (ref 3.80–5.20)
RDW: 18.3 % — AB (ref 11.5–14.5)
WBC: 9.8 10*3/uL (ref 3.6–11.0)

## 2017-06-29 LAB — TYPE AND SCREEN
ABO/RH(D): A POS
ANTIBODY SCREEN: NEGATIVE

## 2017-06-29 LAB — MAGNESIUM: MAGNESIUM: 1.9 mg/dL (ref 1.7–2.4)

## 2017-06-29 MED ORDER — CALCIUM CARBONATE ANTACID 500 MG PO CHEW
800.0000 mg | CHEWABLE_TABLET | Freq: Once | ORAL | Status: AC
Start: 2017-06-29 — End: 2017-06-29
  Administered 2017-06-29: 800 mg via ORAL
  Filled 2017-06-29: qty 4

## 2017-06-29 MED ORDER — CALCITRIOL 0.25 MCG PO CAPS: 0.2500 ug | ORAL_CAPSULE | Freq: Every day | ORAL | 0 refills | Status: DC

## 2017-06-29 NOTE — ED Notes (Signed)
Dr. Burlene Arnt at pt's bedside.

## 2017-06-29 NOTE — ED Notes (Signed)
Dr. Burlene Arnt has okayed pt to have food. Nurse has given pt a boxed lunch and ginger ale.

## 2017-06-29 NOTE — ED Provider Notes (Addendum)
[facility] Emergency Department Provider Note  ____________________________________________   I have reviewed the triage vital signs and the nursing notes.   HISTORY  Chief Complaint Abnormal Labs    HPI Maria Warren is a 61 y.o. female  With a history of hypocalcemia and anemia, etoh abuse, presents saying she has low energy but no other sx. She has a baseline hgb around 10 and she is supposed to be taking calcium supplementation. She has not been taking her calcium supplementation. She sometimes feels crampy. She has no cp no fever no chills.  She reports being faintly guauaic positive at her last md appt but no sob or melena or brbpr. She declines confirmatory guiac. She does smoke. She does drink but hasn't for a week.   She was found to have a hgb of 9.8 and k of 7.1 and was sent here for emergent treatment. These values are consistent with prior.      Past Medical History:  Diagnosis Date  . Anxiety   . Arthritis    knees, lower back  . COPD (chronic obstructive pulmonary disease) ([place])   . Coronary artery disease   . Depression   . GERD (gastroesophageal reflux disease)   . Headache    Pain over left eye, then vision in left eye goes black, quickly resolves.  Said Dr. Melony Overly office aware.  . Hypertension   . Hypothyroidism   . Thyroid disease   . Wears dentures    full upper    Patient Active Problem List   Diagnosis Date Noted  . Tobacco use disorder 02/17/2016  . Major depressive disorder, recurrent severe without psychotic features ([place]) 02/16/2016  . Cannabis use disorder, moderate, dependence ([place]) 02/16/2016  . Hypertension 02/16/2016  . Hypothyroidism 02/16/2016  . Coronary artery disease 02/16/2016  . Suicidal ideation 02/16/2016    Past Surgical History:  Procedure Laterality Date  . CATARACT EXTRACTION W/PHACO Right 03/07/2017   Procedure: CATARACT EXTRACTION PHACO AND INTRAOCULAR LENS PLACEMENT ([place])  Right;  Surgeon: Eulogio Bear, MD;  Location: [place];  Service: Ophthalmology;  Laterality: Right;  . CATARACT EXTRACTION W/PHACO Left 04/11/2017   Procedure: CATARACT EXTRACTION PHACO AND INTRAOCULAR LENS PLACEMENT ([place])  left;  Surgeon: Eulogio Bear, MD;  Location: [place];  Service: Ophthalmology;  Laterality: Left;  . CESAREAN SECTION    . COLONOSCOPY N/A 11/24/2015   Procedure: COLONOSCOPY;  Surgeon: Lollie Sails, MD;  Location: [facility] ENDOSCOPY;  Service: Endoscopy;  Laterality: N/A;  . ESOPHAGOGASTRODUODENOSCOPY (EGD) WITH PROPOFOL N/A 11/24/2015   Procedure: ESOPHAGOGASTRODUODENOSCOPY (EGD) WITH PROPOFOL;  Surgeon: Lollie Sails, MD;  Location: [facility] ENDOSCOPY;  Service: Endoscopy;  Laterality: N/A;  . LEFT HEART CATH AND CORONARY ANGIOGRAPHY Left 01/11/2017   Procedure: Left Heart Cath and Coronary Angiography;  Surgeon: Corey Skains, MD;  Location: [place] CV LAB;  Service: Cardiovascular;  Laterality: Left;  . TOTAL THYROIDECTOMY    . TUBAL LIGATION      Prior to Admission medications   Medication Sig Start Date End Date Taking? Authorizing Provider  albuterol (PROVENTIL HFA;VENTOLIN HFA) 108 (90 BASE) MCG/ACT inhaler Inhale 2 puffs into the lungs every 4 (four) hours as needed for wheezing or shortness of breath.   Yes [provider]  aspirin EC 81 MG tablet Take 162 mg by mouth daily.   Yes [provider]  atorvastatin (LIPITOR) 40 MG tablet Take 40 mg by mouth daily.   Yes [provider]  busPIRone (BUSPAR) 10 MG tablet Take 1 tablet (10 mg total) by mouth 3 (three) times daily. 02/21/16  Yes Pucilowska, Jolanta B, MD  calcitRIOL (ROCALTROL) 0.25 MCG capsule Take 0.25 mcg by mouth daily.    [provider]  Cholecalciferol (VITAMIN D3 PO) Take by mouth daily.    [provider]  clonazePAM (KLONOPIN) 0.5 MG tablet Take 0.5 mg by mouth 4 (four) times daily as needed for anxiety.    [provider]   cyanocobalamin 1000 MCG tablet Take 1,000 mcg by mouth daily.    [provider]  escitalopram (LEXAPRO) 20 MG tablet Take 20 mg by mouth daily.    [provider]  Fluticasone-Salmeterol (ADVAIR) 250-50 MCG/DOSE AEPB Inhale 1 puff into the lungs 2 (two) times daily.    [provider]  hydrOXYzine (ATARAX/VISTARIL) 25 MG tablet Take 25 mg by mouth 3 (three) times daily as needed.    [provider]  isosorbide mononitrate (IMDUR) 30 MG 24 hr tablet Take 30 mg by mouth daily.    [provider]  levothyroxine (SYNTHROID, LEVOTHROID) 150 MCG tablet Take 150 mcg by mouth daily before breakfast.    [provider]  lisinopril-hydrochlorothiazide (PRINZIDE,ZESTORETIC) 10-12.5 MG tablet Take 1 tablet by mouth daily. 02/22/16   Pucilowska, Jolanta B, MD  nitroGLYCERIN (NITROSTAT) 0.4 MG SL tablet Place 0.4 mg under the tongue every 5 (five) minutes as needed for chest pain.    [provider]  omeprazole (PRILOSEC) 20 MG capsule Take 20 mg by mouth daily.    [provider]  polyethylene glycol (MIRALAX / GLYCOLAX) packet Take 17 g by mouth daily as needed.    [provider]  tiotropium (SPIRIVA) 18 MCG inhalation capsule Place 18 mcg into inhaler and inhale daily.    [provider]  traZODone (DESYREL) 150 MG tablet Take 1 tablet (150 mg total) by mouth at bedtime. Patient not taking: Reported on 03/02/2017 02/21/16   Clovis Fredrickson, MD    Allergies Lyrica [pregabalin]; Topamax [topiramate]; Zoloft [sertraline hcl]; and Bupropion  No family history on file.  Social History Social History  Substance Use Topics  . Smoking status: Former Smoker    Packs/day: 1.00    Years: 25.00    Types: Cigarettes    Quit date: 12/25/2016  . Smokeless tobacco: Never Used  . Alcohol use 4.2 oz/week    7 Cans of beer per week    Review of Systems  Constitutional: No fever/chills Eyes: No visual changes. ENT:  No sore throat. No stiff neck no neck pain Cardiovascular: Denies chest pain. Respiratory: Denies shortness of breath. Gastrointestinal:   no vomiting.  No diarrhea.  No constipation. Genitourinary: Negative for dysuria. Musculoskeletal: Negative lower extremity swelling Skin: Negative for rash. Neurological: Negative for severe headaches, focal weakness or numbness.   ____________________________________________   PHYSICAL EXAM:  VITAL SIGNS: ED Triage Vitals  Enc Vitals Group     BP 06/29/17 1509 115/78     Pulse Rate 06/29/17 1509 85     Resp 06/29/17 1509 18     Temp 06/29/17 1509 99 F (37.2 C)     Temp Source 06/29/17 1509 Oral     SpO2 06/29/17 1509 95 %     Weight 06/29/17 1510 192 lb (87.1 kg)     Height 06/29/17 1510 5\' 5"  (1.651 m)     Head Circumference --      Peak Flow --      Pain Score 06/29/17  1509 8     Pain Loc --      Pain Edu? --      Excl. in [place]? --     Constitutional: Alert and oriented. Well appearing and in no acute distress. Eyes: Conjunctivae are normal Head: Atraumatic HEENT: No congestion/rhinnorhea. Mucous membranes are moist.  Oropharynx non-erythematous Neck:   Nontender with no meningismus, no masses, no stridor Cardiovascular: Normal rate, regular rhythm. Grossly normal heart sounds.  Good peripheral circulation. Respiratory: Normal respiratory effort.  No retractions. Lungs CTAB. Abdominal: Soft and nontender. No distention. No guarding no rebound Back:  There is no focal tenderness or step off.  there is no midline tenderness there are no lesions noted. there is no CVA tenderness Musculoskeletal: No lower extremity tenderness, no upper extremity tenderness. No joint effusions, no DVT signs strong distal pulses no edema Neurologic:  Normal speech and language. No gross focal neurologic deficits are appreciated.  Skin:  Skin is warm, dry and intact. No rash noted. Psychiatric: Mood and affect are normal. Speech and behavior are  normal.  ____________________________________________   LABS (all labs ordered are listed, but only abnormal results are displayed)  Labs Reviewed  CBC - Abnormal; Notable for the following:       Result Value   RBC 3.54 (*)    Hemoglobin 10.0 (*)    HCT 30.0 (*)    RDW 18.3 (*)    All other components within normal limits  BASIC METABOLIC PANEL - Abnormal; Notable for the following:    Calcium 6.9 (*)    All other components within normal limits  HEPATIC FUNCTION PANEL  MAGNESIUM  TYPE AND SCREEN   ____________________________________________  EKG  I personally interpreted any EKGs ordered by me or triage  ____________________________________________  RADIOLOGY  I reviewed any imaging ordered by me or triage that were performed during my shift and, if possible, patient and/or family made aware of any abnormal findings. ____________________________________________   PROCEDURES  Procedure(s) performed: None  Procedures  Critical Care performed: None  ____________________________________________   INITIAL IMPRESSION / ASSESSMENT AND PLAN / ED COURSE  Pertinent labs & imaging results that were available during my care of the patient were reviewed by me and considered in my medical decision making (see chart for details).  Pt with chronically low calcium.  It is low but has been lower.  She has medication for this but is not compliant. Will encourage compliance and will check lft so I can get her corrected ca++ .  hgb is stable. No transfusion indicated.  ecg pending.   ----------------------------------------- 8:08 PM on 06/29/2017 -----------------------------------------  All of this appears very chronic. Pt would prefer not to be admitted. R/b/a of going home explained. She has medication at home for this. Return precautions and f/u given and understood.    ____________________________________________   FINAL CLINICAL IMPRESSION(S) / ED  DIAGNOSES  Final diagnoses:  None      This chart was dictated using voice recognition software.  Despite best efforts to proofread,  errors can occur which can change meaning.     Schuyler Amor, MD 06/29/17 1703    Schuyler Amor, MD 06/29/17 2012

## 2017-06-29 NOTE — ED Notes (Signed)
Pt stating that her PCP called and said her "blood was low and probably needed a blood transfusion." Pt stating stating that she has increased weakness and has had numbness in her hands. Pt takes a prescription strength calcium but has missed "the last few days." Pt stating that she has had chronic HA and CP.

## 2017-06-29 NOTE — ED Triage Notes (Signed)
Pt reports sent by Princella Ion for evaluation of low hemoglobin and calcium levels. Pt states Princella Ion told her she may need a blood transfusion. Pt reports headache and general not feeling well. Pt ambulatory to triage. No apparent distress noted.

## 2017-06-29 NOTE — ED Notes (Signed)
Pt given graham crackers and ginger ale ?

## 2017-06-30 NOTE — ED Notes (Signed)
Pt left room after talking to MD but did not wait on nurse to discharge her and give her the discharge paper work - no signature obtained d/t pt not in room

## 2017-08-09 ENCOUNTER — Emergency Department
Admission: EM | Admit: 2017-08-09 | Discharge: 2017-08-09 | Disposition: A | Discharge status: Home / Self Care | Payer: Medicare Other | Attending: Emergency Medicine | Admitting: Emergency Medicine

## 2017-08-09 DIAGNOSIS — Z7982 Long term (current) use of aspirin: Secondary | ICD-10-CM | POA: Insufficient documentation

## 2017-08-09 DIAGNOSIS — F17200 Nicotine dependence, unspecified, uncomplicated: Secondary | ICD-10-CM | POA: Diagnosis not present

## 2017-08-09 DIAGNOSIS — I2510 Atherosclerotic heart disease of native coronary artery without angina pectoris: Secondary | ICD-10-CM | POA: Insufficient documentation

## 2017-08-09 DIAGNOSIS — F329 Major depressive disorder, single episode, unspecified: Secondary | ICD-10-CM | POA: Diagnosis present

## 2017-08-09 DIAGNOSIS — E039 Hypothyroidism, unspecified: Secondary | ICD-10-CM | POA: Diagnosis not present

## 2017-08-09 DIAGNOSIS — J449 Chronic obstructive pulmonary disease, unspecified: Secondary | ICD-10-CM | POA: Diagnosis not present

## 2017-08-09 DIAGNOSIS — F4325 Adjustment disorder with mixed disturbance of emotions and conduct: Secondary | ICD-10-CM

## 2017-08-09 DIAGNOSIS — Z608 Other problems related to social environment: Secondary | ICD-10-CM | POA: Insufficient documentation

## 2017-08-09 DIAGNOSIS — Z79899 Other long term (current) drug therapy: Secondary | ICD-10-CM | POA: Insufficient documentation

## 2017-08-09 DIAGNOSIS — F32A Depression, unspecified: Secondary | ICD-10-CM

## 2017-08-09 DIAGNOSIS — R45851 Suicidal ideations: Secondary | ICD-10-CM | POA: Insufficient documentation

## 2017-08-09 DIAGNOSIS — F1410 Cocaine abuse, uncomplicated: Secondary | ICD-10-CM | POA: Diagnosis not present

## 2017-08-09 DIAGNOSIS — I10 Essential (primary) hypertension: Secondary | ICD-10-CM | POA: Insufficient documentation

## 2017-08-09 LAB — COMPREHENSIVE METABOLIC PANEL
ALBUMIN: 3.8 g/dL (ref 3.5–5.0)
ALT: 18 U/L (ref 14–54)
AST: 30 U/L (ref 15–41)
Alkaline Phosphatase: 75 U/L (ref 38–126)
Anion gap: 8 (ref 5–15)
BILIRUBIN TOTAL: 0.6 mg/dL (ref 0.3–1.2)
BUN: 10 mg/dL (ref 6–20)
CALCIUM: 8.9 mg/dL (ref 8.9–10.3)
CO2: 28 mmol/L (ref 22–32)
Chloride: 103 mmol/L (ref 101–111)
Creatinine, Ser: 1.03 mg/dL — ABNORMAL HIGH (ref 0.44–1.00)
GFR calc Af Amer: >60 mL/min (ref >60)
GFR calc non Af Amer: 57 mL/min — ABNORMAL LOW (ref >60)
GLUCOSE: 124 mg/dL — AB (ref 65–99)
Potassium: 3.9 mmol/L (ref 3.5–5.1)
SODIUM: 139 mmol/L (ref 135–145)
TOTAL PROTEIN: 7.4 g/dL (ref 6.5–8.1)

## 2017-08-09 LAB — URINE DRUG SCREEN, QUALITATIVE (ARMC ONLY)
AMPHETAMINES, UR SCREEN: NOT DETECTED
Barbiturates, Ur Screen: NOT DETECTED
Benzodiazepine, Ur Scrn: NOT DETECTED
COCAINE METABOLITE, UR ~~LOC~~: POSITIVE — AB
Cannabinoid 50 Ng, Ur ~~LOC~~: NOT DETECTED
MDMA (ECSTASY) UR SCREEN: NOT DETECTED
METHADONE SCREEN, URINE: NOT DETECTED
OPIATE, UR SCREEN: NOT DETECTED
Phencyclidine (PCP) Ur S: NOT DETECTED
Tricyclic, Ur Screen: POSITIVE — AB

## 2017-08-09 LAB — ETHANOL: Alcohol, Ethyl (B): 49 mg/dL — ABNORMAL HIGH (ref <10)

## 2017-08-09 LAB — ACETAMINOPHEN LEVEL

## 2017-08-09 LAB — CBC
HEMATOCRIT: 33.7 % — AB (ref 35.0–47.0)
HEMOGLOBIN: 11.2 g/dL — AB (ref 12.0–16.0)
MCH: 29.6 pg (ref 26.0–34.0)
MCHC: 33.2 g/dL (ref 32.0–36.0)
MCV: 89.3 fL (ref 80.0–100.0)
Platelets: 284 10*3/uL (ref 150–440)
RBC: 3.77 MIL/uL — ABNORMAL LOW (ref 3.80–5.20)
RDW: 20.7 % — ABNORMAL HIGH (ref 11.5–14.5)
WBC: 12.3 10*3/uL — AB (ref 3.6–11.0)

## 2017-08-09 LAB — SALICYLATE LEVEL: Salicylate Lvl: <7 mg/dL (ref 2.8–30.0)

## 2017-08-09 MED ORDER — IBUPROFEN 600 MG PO TABS
600.0000 mg | ORAL_TABLET | Freq: Once | ORAL | Status: AC
  Administered 2017-08-09: 600 mg via ORAL
  Filled 2017-08-09: qty 1

## 2017-08-09 NOTE — BH Assessment (Signed)
Assessment Note  Maria Warren is an 61 y.o. female who presents to the ER due to stress from her roommate. Both the roommate and the patient are abusing drugs. Patient states she want to stop but the she's unable to because of their use. Other drug users are coming to the house and she is unable to get them to leave. She states, she have called law enforcement and they said they could not make them leave. Patient denies SI/HI and AV/H.   Diagnosis: Depression & Substance Use Disorder  Past Medical History:  Past Medical History:  Diagnosis Date  . Anxiety   . Arthritis    knees, lower back  . COPD (chronic obstructive pulmonary disease) ([place])   . Coronary artery disease   . Depression   . GERD (gastroesophageal reflux disease)   . Headache    Pain over left eye, then vision in left eye goes black, quickly resolves.  Said Dr. Melony Overly office aware.  . Hypertension   . Hypothyroidism   . Thyroid disease   . Wears dentures    full upper    Past Surgical History:  Procedure Laterality Date  . CATARACT EXTRACTION W/PHACO Right 03/07/2017   Procedure: CATARACT EXTRACTION PHACO AND INTRAOCULAR LENS PLACEMENT ([place])  Right;  Surgeon: Eulogio Bear, MD;  Location: [facility];  Service: Ophthalmology;  Laterality: Right;  . CATARACT EXTRACTION W/PHACO Left 04/11/2017   Procedure: CATARACT EXTRACTION PHACO AND INTRAOCULAR LENS PLACEMENT ([place])  left;  Surgeon: Eulogio Bear, MD;  Location: [facility];  Service: Ophthalmology;  Laterality: Left;  . CESAREAN SECTION    . COLONOSCOPY N/A 11/24/2015   Procedure: COLONOSCOPY;  Surgeon: Lollie Sails, MD;  Location: [facility] ENDOSCOPY;  Service: Endoscopy;  Laterality: N/A;  . ESOPHAGOGASTRODUODENOSCOPY (EGD) WITH PROPOFOL N/A 11/24/2015   Procedure: ESOPHAGOGASTRODUODENOSCOPY (EGD) WITH PROPOFOL;  Surgeon: Lollie Sails, MD;  Location: [facility] ENDOSCOPY;  Service: Endoscopy;  Laterality: N/A;  . LEFT HEART CATH AND CORONARY  ANGIOGRAPHY Left 01/11/2017   Procedure: Left Heart Cath and Coronary Angiography;  Surgeon: Corey Skains, MD;  Location: [place] [facility];  Service: Cardiovascular;  Laterality: Left;  . TOTAL THYROIDECTOMY    . TUBAL LIGATION      Family History: No family history on file.  Social History:  reports that she has been smoking.  She has a 25.00 pack-year smoking history. She has never used smokeless tobacco. She reports that she drinks about 4.2 oz of alcohol per week . She reports that she uses drugs, including Cocaine.  Additional Social History:  Alcohol / Drug Use Pain Medications: See PTA Prescriptions: See PTA Over the Counter: See PTA History of alcohol / drug use?: Yes Longest period of sobriety (when/how long): Unable to quantify Negative Consequences of Use: Personal relationships, Financial Withdrawal Symptoms:  (n/a) Substance #1 Name of Substance 1: Cocaine 1 - Age of First Use: 59 1 - Amount (size/oz): Unable to quantify 1 - Frequency: Two to Three years 1 - Duration: A year 1 - Last Use / Amount: 08/09/2017 Substance #2 Name of Substance 2: Alcohol 2 - Last Use / Amount: 08/08/2017  CIWA: CIWA-Ar BP: (!) 165/67 Pulse Rate: 100 COWS:    Allergies:  Allergies  Allergen Reactions  . Lyrica [Pregabalin] Nausea Only  . Topamax [Topiramate] Nausea Only  . Zoloft [Sertraline Hcl] Nausea Only  . Bupropion Nausea Only    Light headed    Home Medications:  (Not in a hospital  admission)  OB/GYN Status:  No LMP recorded. Patient is postmenopausal.  General Assessment Data Assessment unable to be completed: Yes Location of Assessment: [facility] ED TTS Assessment: In system Is this a Tele or Face-to-Face Assessment?: Face-to-Face Is this an Initial Assessment or a Re-assessment for this encounter?: Initial Assessment Marital status: Divorced [place] name: n/a Is patient pregnant?: No Pregnancy Status: No Living Arrangements: Other (Comment) (Have a  roommate) Can pt return to current living arrangement?: Yes Admission Status: Voluntary Is patient capable of signing voluntary admission?: Yes Referral Source: Self/Family/Friend Insurance type: [employer] Screening Exam ([place]) Medical Exam completed: Yes  Crisis Care Plan Living Arrangements: Other (Comment) (Have a roommate) Legal Guardian: Other: (Self) Name of Psychiatrist: [facility] Name of Therapist: [facility]  Education Status Is patient currently in school?: No Current Grade: n/a Highest grade of school patient has completed: n/a Name of school: n/a Contact person: n/a  Risk to self with the past 6 months Suicidal Ideation: No Has patient been a risk to self within the past 6 months prior to admission? : No Suicidal Intent: No Has patient had any suicidal intent within the past 6 months prior to admission? : No Is patient at risk for suicide?: No Suicidal Plan?: No Has patient had any suicidal plan within the past 6 months prior to admission? : No Access to Means: No What has been your use of drugs/alcohol within the last 12 months?: Cocaine & Alcohol Previous Attempts/Gestures: No How many times?: 0 Other Self Harm Risks: Active Addiction Triggers for Past Attempts: None known Intentional Self Injurious Behavior: None Family Suicide History: No Recent stressful life event(s): Other (Comment) (Problems with Roommate) Persecutory voices/beliefs?: No Depression: Yes Depression Symptoms: Isolating, Guilt, Feeling angry/irritable Substance abuse history and/or treatment for substance abuse?: Yes Suicide prevention information given to non-admitted patients: Not applicable  Risk to Others within the past 6 months Homicidal Ideation: No Does patient have any lifetime risk of violence toward others beyond the six months prior to admission? : No Thoughts of Harm to Others: No Current Homicidal Intent: No Current Homicidal Plan:  No Access to Homicidal Means: No Identified Victim: Reports of none History of harm to others?: No Assessment of Violence: None Noted Violent Behavior Description: Reports of none Does patient have access to weapons?: No Criminal Charges Pending?: No Does patient have a court date: No Is patient on probation?: No  Psychosis Hallucinations: None noted Delusions: None noted  Mental Status Report Appearance/Hygiene: Unremarkable, In scrubs Eye Contact: Good Motor Activity: Freedom of movement, Unremarkable Speech: Logical/coherent, Soft, Unremarkable Level of Consciousness: Alert Mood: Depressed, Anxious, Sad, Pleasant Affect: Anxious, Sad Anxiety Level: Minimal Thought Processes: Coherent, Relevant Judgement: Unimpaired Orientation: Person, Place, Time, Situation, Appropriate for developmental age Obsessive Compulsive Thoughts/Behaviors: None  Cognitive Functioning Concentration: Normal Memory: Recent Intact, Remote Intact IQ: Average Insight: Fair Impulse Control: Fair Appetite: Good Weight Loss: 0 Weight Gain: 0 Sleep: No Change Total Hours of Sleep: 8 Vegetative Symptoms: None  ADLScreening [facility] Assessment Services) Patient's cognitive ability adequate to safely complete daily activities?: Yes Patient able to express need for assistance with ADLs?: Yes Independently performs ADLs?: Yes (appropriate for developmental age)  Prior Inpatient Therapy Prior Inpatient Therapy: Yes Prior Therapy Dates: 01/2016 & 08/2013 Prior Therapy Facilty/Provider(s): [facility] Reason for Treatment: Depression  Prior Outpatient Therapy Prior Outpatient Therapy: Yes Prior Therapy Dates: Currently Prior Therapy Facilty/Provider(s): [facility] Reason for Treatment: Mood Disorder Does patient have an ACCT team?: No Does  patient have Intensive In-House Services?  : No Does patient have [facility] services? : No Does patient have P4CC services?: No  ADL Screening (condition at  time of admission) Patient's cognitive ability adequate to safely complete daily activities?: Yes Is the patient deaf or have difficulty hearing?: No Does the patient have difficulty seeing, even when wearing glasses/contacts?: No Does the patient have difficulty concentrating, remembering, or making decisions?: No Patient able to express need for assistance with ADLs?: Yes Does the patient have difficulty dressing or bathing?: No Independently performs ADLs?: Yes (appropriate for developmental age) Does the patient have difficulty walking or climbing stairs?: No Weakness of Legs: None Weakness of Arms/Hands: None  Home Assistive Devices/Equipment Home Assistive Devices/Equipment: None  Therapy Consults (therapy consults require a physician order) PT Evaluation Needed: No OT Evalulation Needed: No SLP Evaluation Needed: No Abuse/Neglect Assessment (Assessment to be complete while patient is alone) Physical Abuse: Denies Verbal Abuse: Denies Sexual Abuse: Denies Exploitation of patient/patient's resources: Denies Self-Neglect: Denies Values / Beliefs Cultural Requests During Hospitalization: None Spiritual Requests During Hospitalization: None Consults Spiritual Care Consult Needed: No Social Work Consult Needed: No Regulatory affairs officer (For Healthcare) Does Patient Have a Medical Advance Directive?: No Would patient like information on creating a medical advance directive?: No - Patient declined    Additional Information 1:1 In Past 12 Months?: No CIRT Risk: No Elopement Risk: No Does patient have medical clearance?: Yes  Child/Adolescent Assessment Running Away Risk: Denies (Patient is an adult)  Disposition:  Disposition Initial Assessment Completed for this Encounter: Yes Disposition of Patient: Other dispositions (ER MD Ordered Psych Consult)  On Site Evaluation by:   Reviewed with Physician:    Gunnar Fusi MS, LCAS, [place], [place], CCSI Therapeutic Triage  Specialist 08/09/2017 6:24 PM

## 2017-08-09 NOTE — ED Provider Notes (Signed)
[facility] Emergency Department Provider Note  ____________________________________________   None    (approximate)  I have reviewed the triage vital signs and the nursing notes.   HISTORY  Chief Complaint Psychiatric Evaluation    HPI Maria Warren is a 61 y.o. female with a history of psychiatric disease and polysubstance abuse who presents voluntarily with a peer support person for evaluation of depression and suicidal ideation.  She was reportedly holding a knife and considering harming herself.  She states that she feels better now.  She was also considering harming others.  She states that she has not been taking medications for 5 days and his started once again smoking crack and drinking some alcohol.  She states that the problem is that she lives in a mobile home with another person who advised a lot of people over and they all smoke crack.  She had been clean for about a month but relapsed because of her environment.  That made her angry  At her want to harm herself and others.  He currently seems to have good insight into her situation metastases to the suicidal and homicidal thoughts but states that she does not feel them now.  She is calm and cooperative.  Past Medical History:  Diagnosis Date  . Anxiety   . Arthritis    knees, lower back  . COPD (chronic obstructive pulmonary disease) ([place])   . Coronary artery disease   . Depression   . GERD (gastroesophageal reflux disease)   . Headache    Pain over left eye, then vision in left eye goes black, quickly resolves.  Said Dr. Melony Overly office aware.  . Hypertension   . Hypothyroidism   . Thyroid disease   . Wears dentures    full upper    Patient Active Problem List   Diagnosis Date Noted  . Adjustment disorder with mixed disturbance of emotions and conduct 08/09/2017  . Cocaine abuse ([place]) 08/09/2017  . Tobacco use disorder 02/17/2016  . Major depressive disorder, recurrent severe without  psychotic features ([place]) 02/16/2016  . Cannabis use disorder, moderate, dependence ([place]) 02/16/2016  . Hypertension 02/16/2016  . Hypothyroidism 02/16/2016  . Coronary artery disease 02/16/2016  . Suicidal ideation 02/16/2016    Past Surgical History:  Procedure Laterality Date  . CATARACT EXTRACTION W/PHACO Right 03/07/2017   Procedure: CATARACT EXTRACTION PHACO AND INTRAOCULAR LENS PLACEMENT ([place])  Right;  Surgeon: Eulogio Bear, MD;  Location: [place];  Service: Ophthalmology;  Laterality: Right;  . CATARACT EXTRACTION W/PHACO Left 04/11/2017   Procedure: CATARACT EXTRACTION PHACO AND INTRAOCULAR LENS PLACEMENT ([place])  left;  Surgeon: Eulogio Bear, MD;  Location: [place];  Service: Ophthalmology;  Laterality: Left;  . CESAREAN SECTION    . COLONOSCOPY N/A 11/24/2015   Procedure: COLONOSCOPY;  Surgeon: Lollie Sails, MD;  Location: [facility] ENDOSCOPY;  Service: Endoscopy;  Laterality: N/A;  . ESOPHAGOGASTRODUODENOSCOPY (EGD) WITH PROPOFOL N/A 11/24/2015   Procedure: ESOPHAGOGASTRODUODENOSCOPY (EGD) WITH PROPOFOL;  Surgeon: Lollie Sails, MD;  Location: [facility] ENDOSCOPY;  Service: Endoscopy;  Laterality: N/A;  . LEFT HEART CATH AND CORONARY ANGIOGRAPHY Left 01/11/2017   Procedure: Left Heart Cath and Coronary Angiography;  Surgeon: Corey Skains, MD;  Location: [place] CV LAB;  Service: Cardiovascular;  Laterality: Left;  . TOTAL THYROIDECTOMY    . TUBAL LIGATION      Prior to Admission medications   Medication Sig Start Date End Date Taking? Authorizing Provider  albuterol (  PROVENTIL HFA;VENTOLIN HFA) 108 (90 BASE) MCG/ACT inhaler Inhale 2 puffs into the lungs every 4 (four) hours as needed for wheezing or shortness of breath.    [provider]  aspirin EC 81 MG tablet Take 162 mg by mouth daily.    [provider]  atorvastatin (LIPITOR) 40 MG tablet Take 40 mg by mouth daily.    [provider]  busPIRone (BUSPAR) 10  MG tablet Take 1 tablet (10 mg total) by mouth 3 (three) times daily. 02/21/16   Pucilowska, Herma Ard B, MD  calcitRIOL (ROCALTROL) 0.25 MCG capsule Take 1 capsule (0.25 mcg total) by mouth daily. 06/29/17   Schuyler Amor, MD  Cholecalciferol (VITAMIN D3 PO) Take by mouth daily.    [provider]  clonazePAM (KLONOPIN) 0.5 MG tablet Take 0.5 mg by mouth 4 (four) times daily as needed for anxiety.    [provider]  cyanocobalamin 1000 MCG tablet Take 1,000 mcg by mouth daily.    [provider]  escitalopram (LEXAPRO) 20 MG tablet Take 20 mg by mouth daily.    [provider]  Fluticasone-Salmeterol (ADVAIR) 250-50 MCG/DOSE AEPB Inhale 1 puff into the lungs 2 (two) times daily.    [provider]  hydrOXYzine (ATARAX/VISTARIL) 25 MG tablet Take 25 mg by mouth 3 (three) times daily as needed.    [provider]  isosorbide mononitrate (IMDUR) 30 MG 24 hr tablet Take 30 mg by mouth daily.    [provider]  levothyroxine (SYNTHROID, LEVOTHROID) 150 MCG tablet Take 150 mcg by mouth daily before breakfast.    [provider]  lisinopril-hydrochlorothiazide (PRINZIDE,ZESTORETIC) 10-12.5 MG tablet Take 1 tablet by mouth daily. 02/22/16   Pucilowska, Jolanta B, MD  nitroGLYCERIN (NITROSTAT) 0.4 MG SL tablet Place 0.4 mg under the tongue every 5 (five) minutes as needed for chest pain.    [provider]  omeprazole (PRILOSEC) 20 MG capsule Take 20 mg by mouth daily.    [provider]  polyethylene glycol (MIRALAX / GLYCOLAX) packet Take 17 g by mouth daily as needed.    [provider]  tiotropium (SPIRIVA) 18 MCG inhalation capsule Place 18 mcg into inhaler and inhale daily.    [provider]  traZODone (DESYREL) 150 MG tablet Take 1 tablet (150 mg total) by mouth at bedtime. 02/21/16   Pucilowska, Wardell Honour, MD    Allergies Lyrica [pregabalin]; Topamax [topiramate]; Zoloft [sertraline hcl];  and Bupropion  No family history on file.  Social History Social History  Substance Use Topics  . Smoking status: Current Every Day Smoker    Packs/day: 1.00    Years: 25.00    Last attempt to quit: 12/25/2016  . Smokeless tobacco: Never Used  . Alcohol use 4.2 oz/week    7 Cans of beer per week    Review of Systems Constitutional: No fever/chills Eyes: No visual changes. ENT: No sore throat. Cardiovascular: Denies chest pain. Respiratory: Denies shortness of breath. Gastrointestinal: No abdominal pain.  No nausea, no vomiting.  No diarrhea.  No constipation. Genitourinary: Negative for dysuria. Musculoskeletal: Negative for neck pain.  Negative for back pain. Integumentary: Negative for rash. Neurological: Negative for headaches, focal weakness or numbness. Psych: Suicidal ideation, thoughts of harming herself and others, now improved.  Polysubstance abuse.   ____________________________________________   PHYSICAL EXAM:  VITAL SIGNS: ED Triage Vitals  Enc Vitals Group     BP 08/09/17 1452 (!) 165/67     Pulse Rate 08/09/17 1452 100  Resp 08/09/17 1452 16     Temp 08/09/17 1452 98.5 F (36.9 C)     Temp Source 08/09/17 1452 Oral     SpO2 08/09/17 1452 95 %     Weight 08/09/17 1452 83 kg (183 lb)     Height 08/09/17 1452 1.676 m (5\' 6" )     Head Circumference --      Peak Flow --      Pain Score 08/09/17 1458 10     Pain Loc --      Pain Edu? --      Excl. in [place]? --     Constitutional: Alert and oriented. Well appearing and in no acute distress. Eyes: Conjunctivae are normal.  Head: Atraumatic. Nose: No congestion/rhinnorhea. Mouth/Throat: Edentulous.  Mucous membranes are moist.  Oropharynx non-erythematous. Neck: No stridor.  No meningeal signs.   Cardiovascular: Normal rate, regular rhythm. Good peripheral circulation. Grossly normal heart sounds. Respiratory: Normal respiratory effort.  No retractions. Lungs CTAB. Gastrointestinal: Soft and  nontender. No distention.  Musculoskeletal: No lower extremity tenderness nor edema. No gross deformities of extremities. Neurologic:  Normal speech and language. No gross focal neurologic deficits are appreciated.  Skin:  Skin is warm, dry and intact. No rash noted. Psychiatric: Mood and affect are normal. Speech and behavior are normal.  Cooperative.  Denies active suicidality at this time  ____________________________________________   LABS (all labs ordered are listed, but only abnormal results are displayed)  Labs Reviewed  COMPREHENSIVE METABOLIC PANEL - Abnormal; Notable for the following:       Result Value   Glucose, Bld 124 (*)    Creatinine, Ser 1.03 (*)    GFR calc non Af Amer 57 (*)    All other components within normal limits  ETHANOL - Abnormal; Notable for the following:    Alcohol, Ethyl (B) 49 (*)    All other components within normal limits  ACETAMINOPHEN LEVEL - Abnormal; Notable for the following:    Acetaminophen (Tylenol), Serum <10 (*)    All other components within normal limits  CBC - Abnormal; Notable for the following:    WBC 12.3 (*)    RBC 3.77 (*)    Hemoglobin 11.2 (*)    HCT 33.7 (*)    RDW 20.7 (*)    All other components within normal limits  URINE DRUG SCREEN, QUALITATIVE ([facility] ONLY) - Abnormal; Notable for the following:    Tricyclic, Ur Screen POSITIVE (*)    Cocaine Metabolite,Ur [place] POSITIVE (*)    All other components within normal limits  SALICYLATE LEVEL   ____________________________________________  EKG  None - EKG not ordered by ED physician ____________________________________________  RADIOLOGY   No results found.  ____________________________________________   PROCEDURES  Critical Care performed: No   Procedure(s) performed:   Procedures   ____________________________________________   INITIAL IMPRESSION / ASSESSMENT AND PLAN / ED COURSE  As part of my medical decision making, I reviewed the following  data within the [place] notes reviewed and incorporated, Labs reviewed  and [place] chart reviewed  The patient is not actively suicidal at this time but admits to prior thoughts of hurting herself and others with a knife.  She lives in a dangerous environment for her given that there are multiple people smoking crack.  I will not place her under involuntary commitment because she wants to be here for evaluation.  I spoke by phone with Dr. Weber Cooks with psychiatry who will evaluate the patient best  treatment.  She has no acute medical conditions at this time.    (Note that documentation was delayed due to multiple ED patients requiring immediate care.)   I spoke in person with Dr. Weber Cooks who evaluated the patient and feels that she does not meet IVC nor inpatient treatment criteria.  He recommended discharge with outpatient follow-up.  I have discharged her according to his recommendations with some information about with whom she can follow-up as an outpatient.  I gave my usual and customary return precautions.     ____________________________________________  FINAL CLINICAL IMPRESSION(S) / ED DIAGNOSES  Final diagnoses:  Depression, unspecified depression type  Cocaine abuse ([place])     MEDICATIONS GIVEN DURING THIS VISIT:  Medications  ibuprofen (ADVIL,MOTRIN) tablet 600 mg (600 mg Oral Given 08/09/17 1822)     NEW OUTPATIENT MEDICATIONS STARTED DURING THIS VISIT:  Discharge Medication List as of 08/09/2017  6:19 PM      Discharge Medication List as of 08/09/2017  6:19 PM      Discharge Medication List as of 08/09/2017  6:19 PM       Note:  This document was prepared using Dragon voice recognition software and may include unintentional dictation errors.    Hinda Kehr, MD 08/10/17 [phone]
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
## 2017-08-09 NOTE — ED Notes (Signed)
FIRST NURSE NOTE:  Pt arrived via POV with daughter, pt states "i need to go to the lower level. I need some help".

## 2017-08-09 NOTE — ED Notes (Signed)

## 2017-08-09 NOTE — ED Notes (Signed)
Pt changed into wine scrubs. RN, Amy T present. Labs drawn and urine collected and sent to lab. Pt given warm blanket and TV remote. Belongings placed in bag and labeled with 1 of 1 bag. Contents include dress, sports bra, underwear, flip flop sandals, sweater, LOVE necklace and hairband.

## 2017-08-09 NOTE — Discharge Instructions (Signed)

## 2017-08-09 NOTE — ED Notes (Signed)
BEHAVIORAL HEALTH ROUNDING Patient sleeping: No. Patient alert and oriented: yes Behavior appropriate: Yes.  ; If no, describe:  Nutrition and fluids offered: yes Toileting and hygiene offered: Yes  Sitter present: q15 minute observations and security  monitoring Law enforcement present: Yes  ODS  

## 2017-08-09 NOTE — ED Notes (Signed)
Pt given supper tray. Dr. Weber Cooks in with pt.
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
## 2017-08-09 NOTE — ED Notes (Signed)
Patient has a purse with a cell phone and 2 sterling silver rings. Patient's clothes placed in belongs bag.

## 2017-09-08 ENCOUNTER — Other Ambulatory Visit: Payer: Self-pay | Admitting: Family Medicine

## 2017-09-08 DIAGNOSIS — Z1231 Encounter for screening mammogram for malignant neoplasm of breast: Secondary | ICD-10-CM

## 2017-10-04 ENCOUNTER — Ambulatory Visit
Admission: RE | Admit: 2017-10-04 | Discharge: 2017-10-04 | Disposition: A | Discharge status: Home / Self Care | Payer: Medicare Other | Source: Ambulatory Visit | Attending: Family Medicine | Admitting: Family Medicine

## 2017-10-04 DIAGNOSIS — Z1231 Encounter for screening mammogram for malignant neoplasm of breast: Secondary | ICD-10-CM | POA: Diagnosis not present

## 2017-10-18 DIAGNOSIS — M222X9 Patellofemoral disorders, unspecified knee: Secondary | ICD-10-CM | POA: Insufficient documentation

## 2017-12-05 ENCOUNTER — Other Ambulatory Visit: Payer: Self-pay | Admitting: Orthopedic Surgery

## 2018-01-08 ENCOUNTER — Ambulatory Visit: Payer: Self-pay | Admitting: Orthopedic Surgery

## 2018-01-17 ENCOUNTER — Encounter
Admission: RE | Admit: 2018-01-17 | Discharge: 2018-01-17 | Disposition: A | Discharge status: Home / Self Care | Payer: Medicare Other | Source: Ambulatory Visit | Attending: Orthopedic Surgery | Admitting: Orthopedic Surgery

## 2018-01-17 ENCOUNTER — Other Ambulatory Visit: Payer: Self-pay

## 2018-01-17 DIAGNOSIS — Z01812 Encounter for preprocedural laboratory examination: Secondary | ICD-10-CM | POA: Diagnosis present

## 2018-01-17 LAB — BASIC METABOLIC PANEL
Anion gap: 10 (ref 5–15)
BUN: 15 mg/dL (ref 6–20)
CO2: 28 mmol/L (ref 22–32)
Calcium: 9.2 mg/dL (ref 8.9–10.3)
Chloride: 102 mmol/L (ref 101–111)
Creatinine, Ser: 0.86 mg/dL (ref 0.44–1.00)
GFR calc Af Amer: >60 mL/min (ref >60)
GFR calc non Af Amer: >60 mL/min (ref >60)
GLUCOSE: 99 mg/dL (ref 65–99)
POTASSIUM: 3.9 mmol/L (ref 3.5–5.1)
Sodium: 140 mmol/L (ref 135–145)

## 2018-01-17 LAB — CBC
HEMATOCRIT: 35.2 % (ref 35.0–47.0)
Hemoglobin: 11.7 g/dL — ABNORMAL LOW (ref 12.0–16.0)
MCH: 30.6 pg (ref 26.0–34.0)
MCHC: 33.2 g/dL (ref 32.0–36.0)
MCV: 92 fL (ref 80.0–100.0)
Platelets: 278 10*3/uL (ref 150–440)
RBC: 3.82 MIL/uL (ref 3.80–5.20)
RDW: 15.7 % — AB (ref 11.5–14.5)
WBC: 11.7 10*3/uL — AB (ref 3.6–11.0)

## 2018-01-17 LAB — URINALYSIS, ROUTINE W REFLEX MICROSCOPIC
Bilirubin Urine: NEGATIVE
Glucose, UA: NEGATIVE mg/dL
HGB URINE DIPSTICK: NEGATIVE
Ketones, ur: NEGATIVE mg/dL
Leukocytes, UA: NEGATIVE
Nitrite: NEGATIVE
Protein, ur: NEGATIVE mg/dL
SPECIFIC GRAVITY, URINE: 1.016 (ref 1.005–1.030)
pH: 5 (ref 5.0–8.0)

## 2018-01-17 LAB — PROTIME-INR
INR: 0.93
Prothrombin Time: 12.4 seconds (ref 11.4–15.2)

## 2018-01-17 LAB — APTT: APTT: 26 s (ref 24–36)

## 2018-01-17 LAB — SURGICAL PCR SCREEN
MRSA, PCR: NEGATIVE
STAPHYLOCOCCUS AUREUS: POSITIVE — AB

## 2018-01-17 NOTE — Patient Instructions (Addendum)
Your procedure is scheduled on: April 1. 2019 MONDAY Report to Day Surgery on the 2nd floor of the [facility]. To find out your arrival time, please call [phone] between 1PM - 3PM on: Friday January 26, 2018  REMEMBER: Instructions that are not followed completely may result in serious medical risk, up to and including death; or upon the discretion of your surgeon and anesthesiologist your surgery may need to be rescheduled.  Do not eat food after midnight the night before your procedure.  No gum chewing, lozengers or hard candies.  You may however, drink CLEAR liquids up to 2 hours before you are scheduled to arrive for your surgery. Do not drink anything within 2 hours of the start of your surgery.  Clear liquids include: - water  - apple juice without pulp - clear gatorade - black coffee or tea (Do NOT add anything to the coffee or tea) Do NOT drink anything that is not on this list.   No Alcohol for 24 hours before or after surgery.  No Smoking including e-cigarettes for 24 hours prior to surgery.  No chewable tobacco products for at least 6 hours prior to surgery.  No nicotine patches on the day of surgery.  On the morning of surgery brush your teeth with toothpaste and water, you may rinse your mouth with mouthwash if you wish. Do not swallow any toothpaste or mouthwash.  Notify your doctor if there is any change in your medical condition (cold, fever, infection).  Do not wear jewelry, make-up, hairpins, clips or nail polish.  Do not wear lotions, powders, or perfumes. You may NOT wear deodorant.  Do not shave 48 hours prior to surgery. Men may shave face and neck.  Contacts and dentures may not be worn into surgery.  Do not bring valuables to the hospital, including drivers license, insurance or credit cards.  [facility] is not responsible for any belongings or valuables.   TAKE THESE MEDICATIONS THE MORNING OF  SURGERY: ATORVASTATIN BUSPIRONE LEXAPRO LEVOTHYROXINE ISOSORBIDE OMEPRAZOLE TAKE DOSE NIGHT BEFORE SURGERY AND DOSE MORNING OF SURGERY   Use CHG Soap or wipes as directed on instruction sheet.   Use inhalers on the day of surgery and bring to the hospital.    Follow recommendations from Cardiologist, Pulmonologist or PCP regarding stopping Aspirin, Coumadin, Plavix, Eliquis, Pradaxa, or Pletal. LAST January 21, 2018  Stop Anti-inflammatories (NSAIDS) such as Advil, Aleve, Ibuprofen, Motrin, Naproxen, Naprosyn and Aspirin based products such as Excedrin, Goodys Powder, BC Powder. (May take Tylenol or Acetaminophen if needed.)  Stop ANY OVER THE COUNTER supplements until after surgery ON January 21, 2018 (May continue Vitamin D, Vitamin B, and multivitamin.)  Wear comfortable clothing (specific to your surgery type) to the hospital.  Plan for stool softeners for home use.  If you are being admitted to the hospital overnight, leave your suitcase in the car. After surgery it may be brought to your room.  If you are being discharged the day of surgery, you will not be allowed to drive home. You will need a responsible adult to drive you home and stay with you that night.   If you are taking public transportation, you will need to have a responsible adult with you. Please confirm with your physician that it is acceptable to use public transportation.   Please call [phone] if you have any questions about these instructions.

## 2018-01-17 NOTE — Pre-Procedure Instructions (Signed)
Abnormal pcr screen postive for staph faxed to Dr Harlow Mares office

## 2018-01-22 ENCOUNTER — Encounter
Admission: RE | Admit: 2018-01-22 | Discharge: 2018-01-22 | Disposition: A | Discharge status: Home / Self Care | Payer: Medicare Other | Source: Ambulatory Visit | Attending: Orthopedic Surgery | Admitting: Orthopedic Surgery

## 2018-01-28 MED ORDER — TRANEXAMIC ACID 1000 MG/10ML IV SOLN
1000.0000 mg | INTRAVENOUS | Status: AC
  Administered 2018-01-29: 1000 mg via INTRAVENOUS
  Filled 2018-01-28: qty 10

## 2018-01-28 MED ORDER — CEFAZOLIN SODIUM-DEXTROSE 2-4 GM/100ML-% IV SOLN
2.0000 g | INTRAVENOUS | Status: AC
  Administered 2018-01-29: 2 g via INTRAVENOUS

## 2018-01-29 ENCOUNTER — Encounter: Payer: Self-pay | Admitting: Certified Registered Nurse Anesthetist

## 2018-01-29 ENCOUNTER — Inpatient Hospital Stay
Admission: RE | Admit: 2018-01-29 | Discharge: 2018-02-01 | DRG: 470 | Disposition: A | Discharge status: Skilled Nursing Facility | Payer: Medicare Other | Source: Ambulatory Visit | Attending: Orthopedic Surgery | Admitting: Orthopedic Surgery

## 2018-01-29 ENCOUNTER — Inpatient Hospital Stay: Payer: Medicare Other | Admitting: Certified Registered Nurse Anesthetist

## 2018-01-29 ENCOUNTER — Inpatient Hospital Stay: Payer: Medicare Other

## 2018-01-29 ENCOUNTER — Other Ambulatory Visit: Payer: Self-pay

## 2018-01-29 ENCOUNTER — Encounter
Admission: RE | Disposition: A | Discharge status: Skilled Nursing Facility | Payer: Self-pay | Source: Ambulatory Visit | Attending: Orthopedic Surgery

## 2018-01-29 DIAGNOSIS — I2510 Atherosclerotic heart disease of native coronary artery without angina pectoris: Secondary | ICD-10-CM | POA: Diagnosis present

## 2018-01-29 DIAGNOSIS — Z8249 Family history of ischemic heart disease and other diseases of the circulatory system: Secondary | ICD-10-CM | POA: Diagnosis not present

## 2018-01-29 DIAGNOSIS — M1712 Unilateral primary osteoarthritis, left knee: Secondary | ICD-10-CM | POA: Diagnosis present

## 2018-01-29 DIAGNOSIS — M25562 Pain in left knee: Secondary | ICD-10-CM | POA: Diagnosis present

## 2018-01-29 DIAGNOSIS — Z09 Encounter for follow-up examination after completed treatment for conditions other than malignant neoplasm: Secondary | ICD-10-CM

## 2018-01-29 DIAGNOSIS — Z8261 Family history of arthritis: Secondary | ICD-10-CM

## 2018-01-29 DIAGNOSIS — Z888 Allergy status to other drugs, medicaments and biological substances status: Secondary | ICD-10-CM

## 2018-01-29 DIAGNOSIS — E039 Hypothyroidism, unspecified: Secondary | ICD-10-CM | POA: Diagnosis present

## 2018-01-29 DIAGNOSIS — Z791 Long term (current) use of non-steroidal anti-inflammatories (NSAID): Secondary | ICD-10-CM | POA: Diagnosis not present

## 2018-01-29 DIAGNOSIS — M810 Age-related osteoporosis without current pathological fracture: Secondary | ICD-10-CM | POA: Diagnosis present

## 2018-01-29 DIAGNOSIS — M797 Fibromyalgia: Secondary | ICD-10-CM | POA: Diagnosis present

## 2018-01-29 DIAGNOSIS — I10 Essential (primary) hypertension: Secondary | ICD-10-CM | POA: Diagnosis present

## 2018-01-29 DIAGNOSIS — J449 Chronic obstructive pulmonary disease, unspecified: Secondary | ICD-10-CM | POA: Diagnosis present

## 2018-01-29 DIAGNOSIS — Z823 Family history of stroke: Secondary | ICD-10-CM | POA: Diagnosis not present

## 2018-01-29 DIAGNOSIS — Z7982 Long term (current) use of aspirin: Secondary | ICD-10-CM | POA: Diagnosis not present

## 2018-01-29 DIAGNOSIS — F329 Major depressive disorder, single episode, unspecified: Secondary | ICD-10-CM | POA: Diagnosis present

## 2018-01-29 DIAGNOSIS — Z809 Family history of malignant neoplasm, unspecified: Secondary | ICD-10-CM | POA: Diagnosis not present

## 2018-01-29 DIAGNOSIS — F17210 Nicotine dependence, cigarettes, uncomplicated: Secondary | ICD-10-CM | POA: Diagnosis present

## 2018-01-29 DIAGNOSIS — M222X2 Patellofemoral disorders, left knee: Secondary | ICD-10-CM | POA: Diagnosis present

## 2018-01-29 DIAGNOSIS — K219 Gastro-esophageal reflux disease without esophagitis: Secondary | ICD-10-CM | POA: Diagnosis present

## 2018-01-29 DIAGNOSIS — F419 Anxiety disorder, unspecified: Secondary | ICD-10-CM | POA: Diagnosis present

## 2018-01-29 DIAGNOSIS — Z79890 Hormone replacement therapy (postmenopausal): Secondary | ICD-10-CM

## 2018-01-29 HISTORY — PX: TOTAL KNEE ARTHROPLASTY: SHX125

## 2018-01-29 LAB — URINE DRUG SCREEN, QUALITATIVE (ARMC ONLY)
AMPHETAMINES, UR SCREEN: POSITIVE — AB
BENZODIAZEPINE, UR SCRN: NOT DETECTED
Barbiturates, Ur Screen: NOT DETECTED
CANNABINOID 50 NG, UR ~~LOC~~: NOT DETECTED
Cocaine Metabolite,Ur ~~LOC~~: NOT DETECTED
MDMA (Ecstasy)Ur Screen: NOT DETECTED
Methadone Scn, Ur: NOT DETECTED
Opiate, Ur Screen: NOT DETECTED
PHENCYCLIDINE (PCP) UR S: NOT DETECTED
Tricyclic, Ur Screen: NOT DETECTED

## 2018-01-29 LAB — TYPE AND SCREEN
ABO/RH(D): A POS
Antibody Screen: NEGATIVE

## 2018-01-29 SURGERY — ARTHROPLASTY, KNEE, TOTAL
Anesthesia: General | Site: Knee | Laterality: Left | Wound class: Clean

## 2018-01-29 MED ORDER — TRAZODONE HCL 50 MG PO TABS
150.0000 mg | ORAL_TABLET | Freq: Every day | ORAL | Status: DC
  Administered 2018-01-29 – 2018-01-31 (×3): 150 mg via ORAL
  Filled 2018-01-29 (×3): qty 3

## 2018-01-29 MED ORDER — ATORVASTATIN CALCIUM 20 MG PO TABS
80.0000 mg | ORAL_TABLET | Freq: Every day | ORAL | Status: DC
  Administered 2018-01-30 – 2018-02-01 (×2): 80 mg via ORAL
  Filled 2018-01-29 (×2): qty 4

## 2018-01-29 MED ORDER — ACETAMINOPHEN 500 MG PO TABS
1000.0000 mg | ORAL_TABLET | Freq: Once | ORAL | Status: AC
  Administered 2018-01-29: 1000 mg via ORAL

## 2018-01-29 MED ORDER — BUPIVACAINE HCL (PF) 0.5 % IJ SOLN
INTRAMUSCULAR | Status: AC
  Filled 2018-01-29: qty 10

## 2018-01-29 MED ORDER — VITAMIN B-12 1000 MCG PO TABS
1000.0000 ug | ORAL_TABLET | Freq: Every day | ORAL | Status: DC
  Administered 2018-01-29 – 2018-02-01 (×3): 1000 ug via ORAL
  Filled 2018-01-29 (×3): qty 1

## 2018-01-29 MED ORDER — ROPIVACAINE HCL 5 MG/ML IJ SOLN
INTRAMUSCULAR | Status: AC
  Filled 2018-01-29: qty 30

## 2018-01-29 MED ORDER — SODIUM CHLORIDE 0.9 % IJ SOLN
INTRAMUSCULAR | Status: AC
  Filled 2018-01-29: qty 50

## 2018-01-29 MED ORDER — MAGNESIUM CITRATE PO SOLN
1.0000 | Freq: Once | ORAL | Status: AC | PRN
  Administered 2018-01-30: 1 via ORAL
  Filled 2018-01-29 (×2): qty 296

## 2018-01-29 MED ORDER — LIDOCAINE HCL (PF) 1 % IJ SOLN
INTRAMUSCULAR | Status: DC | PRN
  Administered 2018-01-29: 2 mL via SUBCUTANEOUS

## 2018-01-29 MED ORDER — FAMOTIDINE 20 MG PO TABS
ORAL_TABLET | ORAL | Status: AC
  Administered 2018-01-29: 20 mg
  Filled 2018-01-29: qty 1

## 2018-01-29 MED ORDER — CALCITRIOL 0.25 MCG PO CAPS
0.2500 ug | ORAL_CAPSULE | Freq: Every day | ORAL | Status: DC
  Administered 2018-01-29 – 2018-02-01 (×3): 0.25 ug via ORAL
  Filled 2018-01-29 (×4): qty 1

## 2018-01-29 MED ORDER — ACETAMINOPHEN 500 MG PO TABS
1000.0000 mg | ORAL_TABLET | Freq: Four times a day (QID) | ORAL | Status: AC
  Administered 2018-01-29 – 2018-01-30 (×3): 1000 mg via ORAL
  Filled 2018-01-29 (×3): qty 2

## 2018-01-29 MED ORDER — PROPOFOL 500 MG/50ML IV EMUL
INTRAVENOUS | Status: AC
  Filled 2018-01-29: qty 50

## 2018-01-29 MED ORDER — VITAMIN D 1000 UNITS PO TABS
1000.0000 [IU] | ORAL_TABLET | Freq: Every day | ORAL | Status: DC
  Administered 2018-01-29 – 2018-02-01 (×3): 1000 [IU] via ORAL
  Filled 2018-01-29 (×3): qty 1

## 2018-01-29 MED ORDER — ASPIRIN EC 325 MG PO TBEC
325.0000 mg | DELAYED_RELEASE_TABLET | Freq: Every day | ORAL | Status: DC
  Administered 2018-01-30 – 2018-02-01 (×2): 325 mg via ORAL
  Filled 2018-01-29 (×2): qty 1

## 2018-01-29 MED ORDER — BISACODYL 10 MG RE SUPP: 10.0000 mg | Freq: Every day | RECTAL | Status: DC | PRN

## 2018-01-29 MED ORDER — FENTANYL CITRATE (PF) 100 MCG/2ML IJ SOLN
INTRAMUSCULAR | Status: AC
  Administered 2018-01-29: 25 ug via INTRAVENOUS
  Filled 2018-01-29: qty 2

## 2018-01-29 MED ORDER — ACETAMINOPHEN 325 MG PO TABS
325.0000 mg | ORAL_TABLET | Freq: Four times a day (QID) | ORAL | Status: DC | PRN
  Administered 2018-01-31: 650 mg via ORAL
  Filled 2018-01-29: qty 2

## 2018-01-29 MED ORDER — FENTANYL CITRATE (PF) 100 MCG/2ML IJ SOLN
50.0000 ug | Freq: Once | INTRAMUSCULAR | Status: AC
  Administered 2018-01-29 (×2): 25 ug via INTRAVENOUS

## 2018-01-29 MED ORDER — PHENOL 1.4 % MT LIQD
1.0000 | OROMUCOSAL | Status: DC | PRN
  Filled 2018-01-29: qty 177

## 2018-01-29 MED ORDER — DEXAMETHASONE SODIUM PHOSPHATE 10 MG/ML IJ SOLN
INTRAMUSCULAR | Status: AC
  Filled 2018-01-29: qty 1

## 2018-01-29 MED ORDER — CHLORHEXIDINE GLUCONATE 4 % EX LIQD: 60.0000 mL | Freq: Once | CUTANEOUS | Status: DC

## 2018-01-29 MED ORDER — LIDOCAINE HCL (PF) 1 % IJ SOLN
INTRAMUSCULAR | Status: AC
  Filled 2018-01-29: qty 5

## 2018-01-29 MED ORDER — GABAPENTIN 300 MG PO CAPS
300.0000 mg | ORAL_CAPSULE | Freq: Three times a day (TID) | ORAL | Status: DC
  Administered 2018-01-29 – 2018-02-01 (×8): 300 mg via ORAL
  Filled 2018-01-29 (×8): qty 1

## 2018-01-29 MED ORDER — ACETAMINOPHEN 500 MG PO TABS
ORAL_TABLET | ORAL | Status: AC
  Filled 2018-01-29: qty 2

## 2018-01-29 MED ORDER — ONDANSETRON HCL 4 MG/2ML IJ SOLN
INTRAMUSCULAR | Status: AC
  Filled 2018-01-29: qty 2

## 2018-01-29 MED ORDER — LACTATED RINGERS IV SOLN
INTRAVENOUS | Status: DC
  Administered 2018-01-29 – 2018-01-30 (×2): via INTRAVENOUS

## 2018-01-29 MED ORDER — BUPIVACAINE-EPINEPHRINE (PF) 0.5% -1:200000 IJ SOLN
INTRAMUSCULAR | Status: AC
  Filled 2018-01-29: qty 30

## 2018-01-29 MED ORDER — TIOTROPIUM BROMIDE MONOHYDRATE 18 MCG IN CAPS
18.0000 ug | ORAL_CAPSULE | Freq: Every day | RESPIRATORY_TRACT | Status: DC
  Administered 2018-01-30 – 2018-02-01 (×2): 18 ug via RESPIRATORY_TRACT
  Filled 2018-01-29: qty 5

## 2018-01-29 MED ORDER — MIDAZOLAM HCL 2 MG/2ML IJ SOLN
1.0000 mg | Freq: Once | INTRAMUSCULAR | Status: AC
  Administered 2018-01-29: 2 mg via INTRAVENOUS

## 2018-01-29 MED ORDER — PROPOFOL 500 MG/50ML IV EMUL
INTRAVENOUS | Status: DC | PRN
  Administered 2018-01-29: 50 ug/kg/min via INTRAVENOUS

## 2018-01-29 MED ORDER — KETOROLAC TROMETHAMINE 15 MG/ML IJ SOLN
7.5000 mg | Freq: Four times a day (QID) | INTRAMUSCULAR | Status: AC
  Administered 2018-01-29 – 2018-01-30 (×3): 7.5 mg via INTRAVENOUS
  Filled 2018-01-29 (×3): qty 1

## 2018-01-29 MED ORDER — ISOSORBIDE MONONITRATE ER 30 MG PO TB24
30.0000 mg | ORAL_TABLET | Freq: Every day | ORAL | Status: DC
  Administered 2018-01-30 – 2018-02-01 (×2): 30 mg via ORAL
  Filled 2018-01-29 (×2): qty 1

## 2018-01-29 MED ORDER — ONDANSETRON HCL 4 MG/2ML IJ SOLN
4.0000 mg | Freq: Four times a day (QID) | INTRAMUSCULAR | Status: DC | PRN
  Administered 2018-01-31: 4 mg via INTRAVENOUS
  Filled 2018-01-29: qty 2

## 2018-01-29 MED ORDER — MIDAZOLAM HCL 2 MG/2ML IJ SOLN
INTRAMUSCULAR | Status: AC
  Filled 2018-01-29: qty 2

## 2018-01-29 MED ORDER — BACITRACIN 50000 UNITS IM SOLR
INTRAMUSCULAR | Status: AC
  Filled 2018-01-29: qty 2

## 2018-01-29 MED ORDER — CEFAZOLIN SODIUM-DEXTROSE 2-4 GM/100ML-% IV SOLN
INTRAVENOUS | Status: AC
  Filled 2018-01-29: qty 100

## 2018-01-29 MED ORDER — MENTHOL 3 MG MT LOZG
1.0000 | LOZENGE | OROMUCOSAL | Status: DC | PRN
  Filled 2018-01-29: qty 9

## 2018-01-29 MED ORDER — ALBUTEROL SULFATE HFA 108 (90 BASE) MCG/ACT IN AERS: 2.0000 | INHALATION_SPRAY | RESPIRATORY_TRACT | Status: DC | PRN

## 2018-01-29 MED ORDER — OXYCODONE HCL 5 MG PO TABS
5.0000 mg | ORAL_TABLET | ORAL | Status: DC | PRN
  Administered 2018-01-29 – 2018-01-30 (×3): 10 mg via ORAL
  Administered 2018-01-31: 5 mg via ORAL
  Administered 2018-01-31: 10 mg via ORAL
  Administered 2018-01-31: 5 mg via ORAL
  Administered 2018-02-01 (×2): 10 mg via ORAL
  Filled 2018-01-29 (×10): qty 2

## 2018-01-29 MED ORDER — BACITRACIN 50000 UNITS IM SOLR
INTRAMUSCULAR | Status: DC | PRN
  Administered 2018-01-29: 12:00:00

## 2018-01-29 MED ORDER — METOCLOPRAMIDE HCL 5 MG/ML IJ SOLN: 5.0000 mg | Freq: Three times a day (TID) | INTRAMUSCULAR | Status: DC | PRN

## 2018-01-29 MED ORDER — OXYCODONE HCL 5 MG PO TABS
10.0000 mg | ORAL_TABLET | ORAL | Status: DC | PRN
  Administered 2018-01-30: 15 mg via ORAL
  Administered 2018-01-30 (×2): 10 mg via ORAL
  Administered 2018-01-31: 15 mg via ORAL
  Filled 2018-01-29 (×2): qty 3

## 2018-01-29 MED ORDER — NITROGLYCERIN 0.4 MG SL SUBL: 0.4000 mg | SUBLINGUAL_TABLET | SUBLINGUAL | Status: DC | PRN

## 2018-01-29 MED ORDER — SODIUM CHLORIDE 0.9 % IV SOLN
INTRAVENOUS | Status: DC | PRN
  Administered 2018-01-29: 30 ug/min via INTRAVENOUS

## 2018-01-29 MED ORDER — GABAPENTIN 300 MG PO CAPS
300.0000 mg | ORAL_CAPSULE | Freq: Once | ORAL | Status: AC
  Administered 2018-01-29: 300 mg via ORAL

## 2018-01-29 MED ORDER — CLONAZEPAM 0.5 MG PO TABS
0.5000 mg | ORAL_TABLET | Freq: Three times a day (TID) | ORAL | Status: DC | PRN
  Administered 2018-01-30 – 2018-02-01 (×2): 0.5 mg via ORAL
  Filled 2018-01-29 (×2): qty 1

## 2018-01-29 MED ORDER — DEXAMETHASONE SODIUM PHOSPHATE 10 MG/ML IJ SOLN
INTRAMUSCULAR | Status: DC | PRN
  Administered 2018-01-29: 10 mg via INTRAVENOUS

## 2018-01-29 MED ORDER — POLYETHYLENE GLYCOL 3350 17 G PO PACK: 17.0000 g | PACK | Freq: Every day | ORAL | Status: DC | PRN

## 2018-01-29 MED ORDER — LEVOTHYROXINE SODIUM 50 MCG PO TABS
150.0000 ug | ORAL_TABLET | Freq: Every day | ORAL | Status: DC
  Administered 2018-01-30 – 2018-02-01 (×3): 150 ug via ORAL
  Filled 2018-01-29 (×4): qty 3

## 2018-01-29 MED ORDER — ONDANSETRON HCL 4 MG/2ML IJ SOLN: 4.0000 mg | Freq: Once | INTRAMUSCULAR | Status: DC | PRN

## 2018-01-29 MED ORDER — GABAPENTIN 300 MG PO CAPS
ORAL_CAPSULE | ORAL | Status: AC
  Filled 2018-01-29: qty 1

## 2018-01-29 MED ORDER — PANTOPRAZOLE SODIUM 40 MG PO TBEC
40.0000 mg | DELAYED_RELEASE_TABLET | Freq: Every day | ORAL | Status: DC
Start: 2018-01-30 — End: 2018-02-01
  Administered 2018-01-30 – 2018-02-01 (×2): 40 mg via ORAL
  Filled 2018-01-29 (×2): qty 1

## 2018-01-29 MED ORDER — ALBUTEROL SULFATE (2.5 MG/3ML) 0.083% IN NEBU: 2.5000 mg | INHALATION_SOLUTION | RESPIRATORY_TRACT | Status: DC | PRN

## 2018-01-29 MED ORDER — ONDANSETRON HCL 4 MG PO TABS: 4.0000 mg | ORAL_TABLET | Freq: Four times a day (QID) | ORAL | Status: DC | PRN

## 2018-01-29 MED ORDER — MIDAZOLAM HCL 5 MG/5ML IJ SOLN
INTRAMUSCULAR | Status: DC | PRN
  Administered 2018-01-29: 0.5 mg via INTRAVENOUS
  Administered 2018-01-29: 1.5 mg via INTRAVENOUS

## 2018-01-29 MED ORDER — FENTANYL CITRATE (PF) 100 MCG/2ML IJ SOLN
INTRAMUSCULAR | Status: AC
  Filled 2018-01-29: qty 2

## 2018-01-29 MED ORDER — BUPIVACAINE LIPOSOME 1.3 % IJ SUSP
INTRAMUSCULAR | Status: AC
Start: 2018-01-29 — End: 2018-01-29
  Filled 2018-01-29: qty 20

## 2018-01-29 MED ORDER — PROPOFOL 10 MG/ML IV BOLUS
INTRAVENOUS | Status: DC | PRN
  Administered 2018-01-29: 24 mg via INTRAVENOUS

## 2018-01-29 MED ORDER — AMPHETAMINE-DEXTROAMPHET ER 5 MG PO CP24
10.0000 mg | ORAL_CAPSULE | Freq: Every day | ORAL | Status: DC
  Administered 2018-02-01: 10 mg via ORAL
  Filled 2018-01-29 (×2): qty 2

## 2018-01-29 MED ORDER — LACTATED RINGERS IV SOLN
INTRAVENOUS | Status: DC
  Administered 2018-01-29: 10:00:00 via INTRAVENOUS

## 2018-01-29 MED ORDER — ESCITALOPRAM OXALATE 20 MG PO TABS
20.0000 mg | ORAL_TABLET | Freq: Every day | ORAL | Status: DC
  Administered 2018-01-30 – 2018-02-01 (×3): 20 mg via ORAL
  Filled 2018-01-29 (×3): qty 1

## 2018-01-29 MED ORDER — CEFAZOLIN SODIUM-DEXTROSE 2-4 GM/100ML-% IV SOLN
2.0000 g | Freq: Four times a day (QID) | INTRAVENOUS | Status: AC
  Administered 2018-01-29 (×2): 2 g via INTRAVENOUS
  Filled 2018-01-29 (×3): qty 100

## 2018-01-29 MED ORDER — FENTANYL CITRATE (PF) 100 MCG/2ML IJ SOLN: 25.0000 ug | INTRAMUSCULAR | Status: DC | PRN

## 2018-01-29 MED ORDER — DOCUSATE SODIUM 100 MG PO CAPS
100.0000 mg | ORAL_CAPSULE | Freq: Two times a day (BID) | ORAL | Status: DC
  Administered 2018-01-29 – 2018-02-01 (×5): 100 mg via ORAL
  Filled 2018-01-29 (×5): qty 1

## 2018-01-29 MED ORDER — MIDAZOLAM HCL 2 MG/2ML IJ SOLN
INTRAMUSCULAR | Status: AC
  Administered 2018-01-29: 2 mg via INTRAVENOUS
  Filled 2018-01-29: qty 2

## 2018-01-29 MED ORDER — BUPIVACAINE-EPINEPHRINE (PF) 0.5% -1:200000 IJ SOLN
INTRAMUSCULAR | Status: DC | PRN
  Administered 2018-01-29: 30 mL

## 2018-01-29 MED ORDER — HYDROMORPHONE HCL 1 MG/ML IJ SOLN
0.5000 mg | INTRAMUSCULAR | Status: DC | PRN
Start: 2018-01-29 — End: 2018-02-01
  Administered 2018-01-29: 1 mg via INTRAVENOUS
  Filled 2018-01-29: qty 1

## 2018-01-29 MED ORDER — SODIUM CHLORIDE 0.9 % IV SOLN
INTRAVENOUS | Status: DC | PRN
  Administered 2018-01-29: 60 mL

## 2018-01-29 MED ORDER — BUSPIRONE HCL 10 MG PO TABS
15.0000 mg | ORAL_TABLET | Freq: Two times a day (BID) | ORAL | Status: DC
  Administered 2018-01-29 – 2018-02-01 (×5): 15 mg via ORAL
  Filled 2018-01-29 (×5): qty 2

## 2018-01-29 MED ORDER — METOCLOPRAMIDE HCL 10 MG PO TABS: 5.0000 mg | ORAL_TABLET | Freq: Three times a day (TID) | ORAL | Status: DC | PRN

## 2018-01-29 MED ORDER — BUPIVACAINE HCL (PF) 0.5 % IJ SOLN
INTRAMUSCULAR | Status: DC | PRN
  Administered 2018-01-29: 3 mL
  Administered 2018-01-29: 30 mL via PERINEURAL

## 2018-01-29 MED ORDER — MOMETASONE FURO-FORMOTEROL FUM 200-5 MCG/ACT IN AERO
2.0000 | INHALATION_SPRAY | Freq: Two times a day (BID) | RESPIRATORY_TRACT | Status: DC
  Administered 2018-01-29 – 2018-02-01 (×4): 2 via RESPIRATORY_TRACT
  Filled 2018-01-29: qty 8.8

## 2018-01-29 SURGICAL SUPPLY — 54 items
BLADE SAW 1 (BLADE) ×3 IMPLANT
BLADE SAW 1/2 (BLADE) ×3 IMPLANT
BOWL CEMENT MIX W/ADAPTER (MISCELLANEOUS) ×3 IMPLANT
BRUSH SCRUB EZ  4% CHG (MISCELLANEOUS) ×2
BRUSH SCRUB EZ 4% CHG (MISCELLANEOUS) ×1 IMPLANT
CANISTER SUCT 1200ML W/VALVE (MISCELLANEOUS) ×3 IMPLANT
CANISTER SUCT 3000ML PPV (MISCELLANEOUS) ×6 IMPLANT
CAP KNEE TOTAL 3 SIGMA ×3 IMPLANT
CEMENT BONE 1-PACK (Cement) ×6 IMPLANT
CHLORAPREP W/TINT 26ML (MISCELLANEOUS) ×6 IMPLANT
COOLER POLAR GLACIER W/PUMP (MISCELLANEOUS) ×3 IMPLANT
CUFF TOURN 30 STER DUAL PORT (MISCELLANEOUS) ×3 IMPLANT
DRAPE INCISE IOBAN 66X60 STRL (DRAPES) ×3 IMPLANT
DRAPE SHEET LG 3/4 BI-LAMINATE (DRAPES) ×3 IMPLANT
DRSG AQUACEL AG ADV 3.5X14 (GAUZE/BANDAGES/DRESSINGS) ×3 IMPLANT
ELECT REM PT RETURN 9FT ADLT (ELECTROSURGICAL) ×3
ELECTRODE REM PT RTRN 9FT ADLT (ELECTROSURGICAL) ×1 IMPLANT
GAUZE PETRO XEROFOAM 1X8 (MISCELLANEOUS) ×3 IMPLANT
GLOVE BIOGEL PI IND STRL 7.0 (GLOVE) ×6 IMPLANT
GLOVE BIOGEL PI INDICATOR 7.0 (GLOVE) ×12
GLOVE INDICATOR 8.0 STRL GRN (GLOVE) ×3 IMPLANT
GLOVE SURG ORTHO 8.0 STRL STRW (GLOVE) ×9 IMPLANT
GOWN STRL REUS W/ TWL LRG LVL3 (GOWN DISPOSABLE) ×2 IMPLANT
GOWN STRL REUS W/ TWL XL LVL3 (GOWN DISPOSABLE) ×1 IMPLANT
GOWN STRL REUS W/TWL LRG LVL3 (GOWN DISPOSABLE) ×4
GOWN STRL REUS W/TWL XL LVL3 (GOWN DISPOSABLE) ×2
HOOD PEEL AWAY FLYTE STAYCOOL (MISCELLANEOUS) ×9 IMPLANT
IV NS 1000ML (IV SOLUTION) ×2
IV NS 1000ML BAXH (IV SOLUTION) ×1 IMPLANT
KIT TURNOVER KIT A (KITS) ×3 IMPLANT
MAT ABSORB  FLUID 56X50 GRAY (MISCELLANEOUS) ×2
MAT ABSORB FLUID 56X50 GRAY (MISCELLANEOUS) ×1 IMPLANT
NDL SAFETY ECLIPSE 18X1.5 (NEEDLE) ×1 IMPLANT
NEEDLE FILTER BLUNT 18X 1/2SAF (NEEDLE) ×4
NEEDLE FILTER BLUNT 18X1 1/2 (NEEDLE) ×2 IMPLANT
NEEDLE HYPO 18GX1.5 SHARP (NEEDLE) ×2
NEEDLE SPNL 20GX3.5 QUINCKE YW (NEEDLE) ×3 IMPLANT
NS IRRIG 1000ML POUR BTL (IV SOLUTION) ×3 IMPLANT
PACK TOTAL KNEE (MISCELLANEOUS) ×3 IMPLANT
PAD DE MAYO PRESSURE PROTECT (MISCELLANEOUS) ×3 IMPLANT
PAD WRAPON POLAR KNEE (MISCELLANEOUS) ×1 IMPLANT
PULSAVAC PLUS IRRIG FAN TIP (DISPOSABLE) ×3
STAPLER SKIN PROX 35W (STAPLE) ×3 IMPLANT
SUCTION FRAZIER HANDLE 10FR (MISCELLANEOUS) ×2
SUCTION TUBE FRAZIER 10FR DISP (MISCELLANEOUS) ×1 IMPLANT
SUT DVC 2 QUILL PDO  T11 36X36 (SUTURE) ×2
SUT DVC 2 QUILL PDO T11 36X36 (SUTURE) ×1 IMPLANT
SUT VIC AB 2-0 CT1 18 (SUTURE) ×3 IMPLANT
SUT VIC AB 2-0 CT1 27 (SUTURE) ×2
SUT VIC AB 2-0 CT1 TAPERPNT 27 (SUTURE) ×1 IMPLANT
SUT VIC AB PLUS 45CM 1-MO-4 (SUTURE) ×3 IMPLANT
SYR 30ML LL (SYRINGE) ×9 IMPLANT
TIP FAN IRRIG PULSAVAC PLUS (DISPOSABLE) ×1 IMPLANT
WRAPON POLAR PAD KNEE (MISCELLANEOUS) ×3

## 2018-01-29 NOTE — NC FL2 (Signed)
[place] LEVEL OF CARE SCREENING TOOL     IDENTIFICATION  Patient Name: Maria Warren Birthdate: 04-Mar-1956 Sex: female Admission Date (Current Location): 01/29/2018  [facility] and [place] Number:  Maria Warren ([id number]) Facility and Address:  [facility], [address], [employer] [place], [place] [address]      Provider Number: [id number]  Attending Physician Name and Address:  Lovell Sheehan, MD  Relative Name and Phone Number:       Current Level of Care: Hospital Recommended Level of Care: [place] Prior Approval Number:    Date Approved/Denied:   PASRR Number:    Discharge Plan: SNF    Current Diagnoses: Patient Active Problem List   Diagnosis Date Noted  . Osteoarthritis of left knee 01/29/2018  . Adjustment disorder with mixed disturbance of emotions and conduct 08/09/2017  . Cocaine abuse ([place]) 08/09/2017  . Tobacco use disorder 02/17/2016  . Major depressive disorder, recurrent severe without psychotic features ([place]) 02/16/2016  . Cannabis use disorder, moderate, dependence ([place]) 02/16/2016  . Hypertension 02/16/2016  . Hypothyroidism 02/16/2016  . Coronary artery disease 02/16/2016  . Suicidal ideation 02/16/2016    Orientation RESPIRATION BLADDER Height & Weight     Self, Time, Situation, Place  O2(2 Liters Oxygen. ) Continent Weight: 183 lb (83 kg) Height:  5\' 5"  (165.1 cm)  BEHAVIORAL SYMPTOMS/MOOD NEUROLOGICAL BOWEL NUTRITION STATUS      Continent Diet(Diet: Full Liquid to be Advanced. )  AMBULATORY STATUS COMMUNICATION OF NEEDS Skin   Extensive Assist Verbally Surgical wounds(Incision: Left Knee. )                       Personal Care Assistance Level of Assistance  Bathing, Feeding, Dressing Bathing Assistance: Limited assistance Feeding assistance: Independent Dressing Assistance: Limited assistance     Functional Limitations Info  Sight, Hearing, Speech Sight Info: Adequate Hearing Info:  Adequate Speech Info: Adequate    SPECIAL CARE FACTORS FREQUENCY  PT (By licensed PT), OT (By licensed OT)     PT Frequency: (5) OT Frequency: (5)            Contractures      Additional Factors Info  Code Status, Allergies Code Status Info: (Full Code. ) Allergies Info: (Lyrica Pregabalin, Topamax Topiramate, Zoloft Sertraline Hcl, Bupropion)           Current Medications (01/29/2018):  This is the current hospital active medication list Current Facility-Administered Medications  Medication Dose Route Frequency Provider Last Rate Last Dose  . acetaminophen (TYLENOL) 500 MG tablet           . acetaminophen (TYLENOL) tablet 1,000 mg  1,000 mg Oral Q6H Lovell Sheehan, MD      . Derrill Memo ON 01/30/2018] acetaminophen (TYLENOL) tablet 325-650 mg  325-650 mg Oral Q6H PRN Lovell Sheehan, MD      . albuterol (PROVENTIL) (2.5 MG/3ML) 0.083% nebulizer solution 2.5 mg  2.5 mg Nebulization Q4H PRN Lovell Sheehan, MD      . amphetamine-dextroamphetamine (ADDERALL XR) 24 hr capsule 10 mg  10 mg Oral Daily Lovell Sheehan, MD      . Derrill Memo ON 01/30/2018] aspirin EC tablet 325 mg  325 mg Oral Q breakfast Lovell Sheehan, MD      . Derrill Memo ON 01/30/2018] atorvastatin (LIPITOR) tablet 80 mg  80 mg Oral Daily Lovell Sheehan, MD      . bisacodyl (DULCOLAX) suppository 10 mg  10 mg Rectal  Daily PRN Lovell Sheehan, MD      . busPIRone (BUSPAR) tablet 15 mg  15 mg Oral BID Lovell Sheehan, MD      . calcitRIOL (ROCALTROL) capsule 0.25 mcg  0.25 mcg Oral Daily Lovell Sheehan, MD      . ceFAZolin (ANCEF) IVPB 2g/100 mL premix  2 g Intravenous Q6H Lovell Sheehan, MD      . cholecalciferol (VITAMIN D) tablet 1,000 Units  1,000 Units Oral Daily Lovell Sheehan, MD      . clonazePAM [facility]) tablet 0.5 mg  0.5 mg Oral TID PRN Lovell Sheehan, MD      . docusate sodium (COLACE) capsule 100 mg  100 mg Oral BID Lovell Sheehan, MD      . Derrill Memo ON 01/30/2018] escitalopram (LEXAPRO) tablet 20 mg  20 mg Oral  Daily Lovell Sheehan, MD      . gabapentin (NEURONTIN) 300 MG capsule           . gabapentin (NEURONTIN) capsule 300 mg  300 mg Oral TID Lovell Sheehan, MD      . HYDROmorphone (DILAUDID) injection 0.5-1 mg  0.5-1 mg Intravenous Q4H PRN Lovell Sheehan, MD      . Derrill Memo ON 01/30/2018] isosorbide mononitrate (IMDUR) 24 hr tablet 30 mg  30 mg Oral Daily Lovell Sheehan, MD      . ketorolac (TORADOL) 15 MG/ML injection 7.5 mg  7.5 mg Intravenous Q6H Lovell Sheehan, MD      . lactated ringers infusion   Intravenous Continuous Lovell Sheehan, MD      . Derrill Memo ON 01/30/2018] levothyroxine (SYNTHROID, LEVOTHROID) tablet 150 mcg  150 mcg Oral QAC breakfast Lovell Sheehan, MD      . magnesium citrate solution 1 Bottle  1 Bottle Oral Once PRN Lovell Sheehan, MD      . menthol-cetylpyridinium (CEPACOL) lozenge 3 mg  1 lozenge Oral PRN Lovell Sheehan, MD       Or  . phenol (CHLORASEPTIC) mouth spray 1 spray  1 spray Mouth/Throat PRN Lovell Sheehan, MD      . metoCLOPramide (REGLAN) tablet 5-10 mg  5-10 mg Oral Q8H PRN Lovell Sheehan, MD       Or  . metoCLOPramide (REGLAN) injection 5-10 mg  5-10 mg Intravenous Q8H PRN Lovell Sheehan, MD      . mometasone-formoterol [facility]) 200-5 MCG/ACT inhaler 2 puff  2 puff Inhalation BID Lovell Sheehan, MD      . nitroGLYCERIN (NITROSTAT) SL tablet 0.4 mg  0.4 mg Sublingual Q5 min PRN Lovell Sheehan, MD      . ondansetron [facility]) tablet 4 mg  4 mg Oral Q6H PRN Lovell Sheehan, MD       Or  . ondansetron [facility]) injection 4 mg  4 mg Intravenous Q6H PRN Lovell Sheehan, MD      . oxyCODONE (Oxy IR/ROXICODONE) immediate release tablet 10-15 mg  10-15 mg Oral Q4H PRN Lovell Sheehan, MD      . oxyCODONE (Oxy IR/ROXICODONE) immediate release tablet 5-10 mg  5-10 mg Oral Q4H PRN Lovell Sheehan, MD      . Derrill Memo ON 01/30/2018] pantoprazole (PROTONIX) EC tablet 40 mg  40 mg Oral Daily Lovell Sheehan, MD      . polyethylene glycol (MIRALAX / GLYCOLAX) packet 17 g  17  g Oral Daily PRN Lovell Sheehan, MD      .  ropivacaine (PF) 5 mg/mL (0.5%) (NAROPIN) 5 MG/ML injection           . [START ON 01/30/2018] tiotropium (SPIRIVA) inhalation capsule 18 mcg  18 mcg Inhalation Daily Lovell Sheehan, MD      . traZODone (DESYREL) tablet 150 mg  150 mg Oral QHS Lovell Sheehan, MD      . vitamin B-12 (CYANOCOBALAMIN) tablet 1,000 mcg  1,000 mcg Oral Daily Lovell Sheehan, MD         Discharge Medications: Please see discharge summary for a list of discharge medications.  Relevant Imaging Results:  Relevant Lab Results:   Additional Information (SSN: [id number])  Ciji Boston, Veronia Beets, LCSW

## 2018-01-29 NOTE — Progress Notes (Signed)
Pt arrived to room 141 from PACU. Pt is A&Ox4, can move bilateral legs/feet, denies pain. Aquacel dressing intact to left knee, polar care in place. Pt oriented to room and pain medication regimen.  Maria Warren, Jerry Caras

## 2018-01-29 NOTE — Anesthesia Post-op Follow-up Note (Signed)
Anesthesia QCDR form completed.        

## 2018-01-29 NOTE — H&P (Signed)
The patient has been re-examined, and the chart reviewed, and there have been no interval changes to the documented history and physical.  Plan a left total knee replacement today.  Anesthesia is consulted regarding a peripheral nerve block for post-operative pain.  The risks, benefits, and alternatives have been discussed at length, and the patient is willing to proceed.     

## 2018-01-29 NOTE — Anesthesia Preprocedure Evaluation (Addendum)
Anesthesia Evaluation  Patient identified by MRN, date of birth, ID band Patient awake    Reviewed: Allergy & Precautions, H&P , NPO status , Patient's Chart, lab work & pertinent test results, reviewed documented beta blocker date and time   Airway Mallampati: II   Neck ROM: full    Dental  (+) Poor Dentition, Upper Dentures, Lower Dentures   Pulmonary neg pulmonary ROS, COPD,  COPD inhaler, Current Smoker,    Pulmonary exam normal        Cardiovascular Exercise Tolerance: Poor hypertension, On Medications + CAD  negative cardio ROS Normal cardiovascular exam Rhythm:regular Rate:Normal     Neuro/Psych  Headaches, PSYCHIATRIC DISORDERS Anxiety Depression negative neurological ROS  negative psych ROS   GI/Hepatic negative GI ROS, Neg liver ROS, GERD  Medicated,  Endo/Other  negative endocrine ROSHypothyroidism   Renal/GU negative Renal ROS  negative genitourinary   Musculoskeletal  (+) Arthritis ,   Abdominal   Peds  Hematology negative hematology ROS (+)   Anesthesia Other Findings Past Medical History: No date: Anxiety No date: Arthritis     Comment:  knees, lower back No date: COPD (chronic obstructive pulmonary disease) (HCC) No date: Coronary artery disease No date: Depression No date: GERD (gastroesophageal reflux disease) No date: Headache     Comment:  Pain over left eye, then vision in left eye goes black,               quickly resolves.  Said Dr. Melony Overly office aware. No date: Hypertension No date: Hypothyroidism No date: Thyroid disease No date: Wears dentures     Comment:  full upper Past Surgical History: 03/07/2017: CATARACT EXTRACTION W/PHACO; Right     Comment:  Procedure: CATARACT EXTRACTION PHACO AND INTRAOCULAR               LENS PLACEMENT ([place])  Right;  Surgeon: Eulogio Bear, MD;  Location: [facility];  Service:               Ophthalmology;  Laterality:  Right; 04/11/2017: CATARACT EXTRACTION W/PHACO; Left     Comment:  Procedure: CATARACT EXTRACTION PHACO AND INTRAOCULAR               LENS PLACEMENT ([place])  left;  Surgeon: Eulogio Bear,              MD;  Location: [place];  Service:               Ophthalmology;  Laterality: Left; No date: CESAREAN SECTION 11/24/2015: COLONOSCOPY; N/A     Comment:  Procedure: COLONOSCOPY;  Surgeon: Lollie Sails, MD;              Location: [facility] ENDOSCOPY;  Service: Endoscopy;                Laterality: N/A; 11/24/2015: ESOPHAGOGASTRODUODENOSCOPY (EGD) WITH PROPOFOL; N/A     Comment:  Procedure: ESOPHAGOGASTRODUODENOSCOPY (EGD) WITH               PROPOFOL;  Surgeon: Lollie Sails, MD;  Location:               [facility] ENDOSCOPY;  Service: Endoscopy;  Laterality: N/A; 01/11/2017: LEFT HEART CATH AND CORONARY ANGIOGRAPHY; Left     Comment:  Procedure: Left Heart Cath and Coronary Angiography;                Surgeon: Darnell Level  Kelli Hope, MD;  Location: [place]               CV LAB;  Service: Cardiovascular;  Laterality: Left; No date: TOTAL THYROIDECTOMY No date: TUBAL LIGATION BMI    Body Mass Index:  30.45 kg/m     Reproductive/Obstetrics negative OB ROS                            Anesthesia Physical Anesthesia Plan  ASA: IV  Anesthesia Plan: General and Spinal   Post-op Pain Management:  Regional for Post-op pain   Induction:   PONV Risk Score and Plan: 4 or greater  Airway Management Planned:   Additional Equipment:   Intra-op Plan:   Post-operative Plan:   Informed Consent: I have reviewed the patients History and Physical, chart, labs and discussed the procedure including the risks, benefits and alternatives for the proposed anesthesia with the patient or authorized representative who has indicated his/her understanding and acceptance.   Dental Advisory Given  Plan Discussed with: CRNA  Anesthesia Plan Comments: (Hx of Cocaine abuse.   Currently negative. Risks of SAB accepted.  Plan agreed to.  Greggory Brandy)       Anesthesia Quick Evaluation

## 2018-01-29 NOTE — Op Note (Signed)
DATE OF SURGERY:  01/29/2018 TIME: 1:26 PM  PATIENT NAME:  Maria Warren   AGE: 62 y.o.    PRE-OPERATIVE DIAGNOSIS:  M17.0 Bilateral Primary osteoarthritis of knee  POST-OPERATIVE DIAGNOSIS:  Same  PROCEDURE:  Procedure(s): TOTAL KNEE ARTHROPLASTY  SURGEON:  Lovell Sheehan, MD   ASSISTANT:  Wyatt Portela, PA-C  OPERATIVE IMPLANTS: Depuy Sigma, Cruciate Retaining Femoral component size  2.5, Sigma Fixed Bearing Tray size 2, Patella polyethylene 3-peg oval button size 32 mm, with a 10 mm polyethylene Curve-Plus insert.   PREOPERATIVE INDICATIONS:  Maria Warren is an 62 y.o. female who has a diagnosis of <principal problem not specified> and elected for a left total knee arthroplasty after failing nonoperative treatment, including activity modification, pain medication, physical therapy and injections who has significant impairment of their activities of daily living.  Radiographs have demonstrated tricompartmental osteoarthritis joint space narrowing, osteophytes, subchondral sclerosis and cyst formation.  The risks, benefits, and alternatives were discussed at length including but not limited to the risks of infection, bleeding, nerve or blood vessel injury, knee stiffness, fracture, dislocation, loosening or failure of the hardware and the need for further surgery. Medical risks include but not limited to DVT and pulmonary embolism, myocardial infarction, stroke, pneumonia, respiratory failure and death. I discussed these risks with the patient in my office prior to the date of surgery. They understood these risks and were willing to proceed.  OPERATIVE FINDINGS AND UNIQUE ASPECTS OF THE CASE:  All three compartments with advanced and severe degenerative changes, large osteophytes and an abundance of synovial fluid. Significant deformity was also noted. A decision was made to proceed with total knee arthroplasty.   OPERATIVE DESCRIPTION:  The patient was brought to the operative room and  placed in a supine position after undergoing placement of a general anesthetic. IV antibiotics were given. Patient received tranexamic acid. The lower extremity was prepped and draped in the usual sterile fashion.  A time out was performed to verify the patient's name, date of birth, medical record number, correct site of surgery and correct procedure to be performed. The timeout was also used to confirm the patient received antibiotics and that appropriate instruments, implants and radiographs studies were available in the room.  The leg was elevated and exsanguinated with an Esmarch and the tourniquet was inflated to 250 mmHg.  A midline incision was made over the left knee.. A medial parapatellar arthrotomy was then made and the patella subluxed laterally and the knee was brought into 90 of flexion. Hoffa's fat pad along with the anterior cruciate ligament was resected and the medial joint line was exposed.  Attention was then turned to preparation of the patella. The thickness of the patella was measured with a caliper, the diameter measured with the patella templates.  The patella resection was then made with an oscillating saw using the patella cutting guide.  The 32 mm button fit appropriately.  3 peg holes for the patella component were then drilled.  The extramedullary tibial cutting guide was then placed using the anterior tibial crest and second ray of the foot as a reference.  The tibial cutting guide was adjusted to allow for appropriate posterior slope.  The tibial cutting block was pinned into position. The slotted stylus was used to measure the proximal tibial resection of 2 mm off the low medial side. Care was taken during the tibial resection to protect the medial and collateral ligaments.  The resected tibial bone was removed.  The distal  femur was resected using the TruMatch cutting guide.  Care was taken to protect the collateral ligaments during distal femoral resection.  The distal  femoral resection was performed with an oscillating saw. The femoral cutting guide was then removed. Extension gap was measured with a 10 mm spacer block and alignment and extension was confirmed using a long alignment rod. The femur was sized to be a 4. Rotation of the referencing guide was checked with the epicondylar axis and Whitesides line. Then the 4-in-1 cutting jig was then applied to the distal femur. A stylus was used to confirm that the anterior femur would not be notched.   Then the anterior, posterior and chamfer femoral cuts were then made with an oscillating saw.  All posterior osteophytes were removed.  The flexion gap was then measured with a flexion spacer block and long alignment rod and was found to be symmetric with the extension gap and perpendicular to mechanical axis of the tibia.  The proximal tibia plateau was then sized with trial trays. The best coverage was achieved with a size 2. This tibial tray was then pinned into position. The proximal tibia was then prepared with the reamer and keel punch.  After tibial preparation was completed, all trial components were inserted with polyethylene trials.  The knee was found to have excellent balance and full motion with a size 10 mm tibial polyethylene insert..    The trials were then placed. Knee was taken through a full range of motion and deemed to be stable with the trial components. All trial components were then removed.  The joint was copiously irrigated with pulse lavage.  The final total knee arthroplasty components were then cemented into place. The knee was held in extension while cement was allowed to cure.The knee was taken through a range of motion and the patella tracked well and the knee was again irrigated copiously.  The knee capsule was then injected with Exparel.  The medial arthrotomy was closed with #1 Vicryl and #2 Quill. The subcutaneous tissue closed with  2-0 vicryl, and skin approximated with staples.  A dry  sterile and compressive dressing was applied.  A Polar Care was applied to the operative knee.  The patient was awakened and brought to the PACU in stable and satisfactory condition.  All sharp, lap and instrument counts were correct at the conclusion the case. I spoke with the patient's family in the postop consultation room to let them know the case had been performed without complication and the patient was stable in recovery room.   Total tourniquet time was 60 minutes.

## 2018-01-29 NOTE — Transfer of Care (Signed)
Immediate Anesthesia Transfer of Care Note  Patient: Maria Warren  Procedure(s) Performed: TOTAL KNEE ARTHROPLASTY (Left Knee)  Patient Location: PACU  Anesthesia Type:Spinal  Level of Consciousness: awake, alert , oriented and patient cooperative  Airway & Oxygen Therapy: Patient Spontanous Breathing  Post-op Assessment: Report given to RN and Post -op Vital signs reviewed and stable  Post vital signs: Reviewed and stable  Last Vitals:  Vitals Value Taken Time  BP 99/58 01/29/2018  1:21 PM  Temp    Pulse 70 01/29/2018  1:22 PM  Resp 18 01/29/2018  1:22 PM  SpO2 90 % 01/29/2018  1:22 PM  Vitals shown include unvalidated device data.  Last Pain:  Vitals:   01/29/18 1050  TempSrc:   PainSc: 0-No pain         Complications: No apparent anesthesia complications

## 2018-01-29 NOTE — Anesthesia Procedure Notes (Signed)
Spinal  Patient location during procedure: OR Start time: 01/29/2018 11:08 AM End time: 01/29/2018 11:14 AM Staffing Anesthesiologist: Molli Barrows, MD Resident/CRNA: Eben Burow, CRNA Performed: resident/CRNA  Preanesthetic Checklist Completed: patient identified, site marked, surgical consent, pre-op evaluation, timeout performed, IV checked, risks and benefits discussed and monitors and equipment checked Spinal Block Patient position: sitting Prep: ChloraPrep Patient monitoring: heart rate, continuous pulse ox and blood pressure Approach: midline Location: L3-4 Injection technique: single-shot Needle Needle type: Quincke  Needle gauge: 22 G Needle length: 10 cm Assessment Sensory level: T6

## 2018-01-29 NOTE — Anesthesia Procedure Notes (Signed)
Anesthesia Regional Block: Adductor canal block   Pre-Anesthetic Checklist: ,, timeout performed, Correct Patient, Correct Site, Correct Laterality, Correct Procedure, Correct Position, site marked, Risks and benefits discussed,  Surgical consent,  Pre-op evaluation,  At surgeon's request and post-op pain management  Laterality: Left  Prep: chloraprep       Needles:  Injection technique: Single-shot  Needle Type: Echogenic Needle     Needle Length: 10cm  Needle Gauge: 20     Additional Needles:   Procedures:,,,, ultrasound used (permanent image in chart),,,,  Narrative:  Start time: 01/29/2018 10:40 AM Injection made incrementally with aspirations every 5 mL.  Performed by: Personally  Anesthesiologist: Molli Barrows, MD  Additional Notes: Time out performed with Functioning IV  confirmed and monitors applied.  An echogenic needle was used. Sterile prep,hand hygiene and sterile gloves were used. Minimal sedation used for procedure.   No paresthesia endorsed by patient during the procedure.  Negative aspiration and negative test dose prior to incremental administration of local anesthetic. The patient tolerated the procedure well with no immediate complications.

## 2018-01-30 ENCOUNTER — Encounter: Payer: Self-pay | Admitting: Orthopedic Surgery

## 2018-01-30 LAB — BASIC METABOLIC PANEL
ANION GAP: 7 (ref 5–15)
BUN: 10 mg/dL (ref 6–20)
CALCIUM: 8.2 mg/dL — AB (ref 8.9–10.3)
CO2: 28 mmol/L (ref 22–32)
Chloride: 104 mmol/L (ref 101–111)
Creatinine, Ser: 0.98 mg/dL (ref 0.44–1.00)
GFR calc Af Amer: >60 mL/min (ref >60)
GLUCOSE: 197 mg/dL — AB (ref 65–99)
Potassium: 3.9 mmol/L (ref 3.5–5.1)
Sodium: 139 mmol/L (ref 135–145)

## 2018-01-30 LAB — CBC
HEMATOCRIT: 33.3 % — AB (ref 35.0–47.0)
Hemoglobin: 10.8 g/dL — ABNORMAL LOW (ref 12.0–16.0)
MCH: 30.4 pg (ref 26.0–34.0)
MCHC: 32.4 g/dL (ref 32.0–36.0)
MCV: 93.9 fL (ref 80.0–100.0)
PLATELETS: 255 10*3/uL (ref 150–440)
RBC: 3.54 MIL/uL — ABNORMAL LOW (ref 3.80–5.20)
RDW: 15.5 % — AB (ref 11.5–14.5)
WBC: 15.9 10*3/uL — ABNORMAL HIGH (ref 3.6–11.0)

## 2018-01-30 MED ORDER — DIPHENHYDRAMINE HCL 25 MG PO CAPS
25.0000 mg | ORAL_CAPSULE | ORAL | Status: DC | PRN
  Administered 2018-01-30: 25 mg via ORAL
  Filled 2018-01-30: qty 1

## 2018-01-30 MED ORDER — METHOCARBAMOL 500 MG PO TABS
500.0000 mg | ORAL_TABLET | Freq: Four times a day (QID) | ORAL | Status: DC | PRN
  Administered 2018-01-30: 500 mg via ORAL
  Filled 2018-01-30: qty 1

## 2018-01-30 MED ORDER — CYCLOBENZAPRINE HCL 10 MG PO TABS: 10.0000 mg | ORAL_TABLET | Freq: Three times a day (TID) | ORAL | Status: DC | PRN

## 2018-01-30 NOTE — Progress Notes (Signed)
Clinical Social Worker (CSW) received SNF consult. PT is recommending home health. RN case manager aware of above. Please reconsult if future social work needs arise. CSW signing off.   Camiyah Friberg, LCSW [phone] 

## 2018-01-30 NOTE — Progress Notes (Signed)
Subjective:  Patient reports pain as moderate.    Objective:   VITALS:   Vitals:   01/29/18 1933 01/30/18 0019 01/30/18 0419 01/30/18 0726  BP: 124/71 (!) 97/56 127/78 (!) 110/56  Pulse: 78 82 61 70  Resp: 18 18 18    Temp: 97.7 F (36.5 C) 98.2 F (36.8 C) (!) 97.4 F (36.3 C) 97.9 F (36.6 C)  TempSrc: Oral Oral Oral Oral  SpO2: 90% 91% 96% 93%  Weight:      Height:        PHYSICAL EXAM:  Neurovascular intact Dorsiflexion/Plantar flexion intact Incision: no drainage Compartment soft  LABS  Results for orders placed or performed during the hospital encounter of 01/29/18 (from the past 24 hour(s))  Urine Drug Screen, Qualitative ([facility] only)     Status: Abnormal   Collection Time: 01/29/18  9:06 AM  Result Value Ref Range   Tricyclic, Ur Screen NONE DETECTED NONE DETECTED   Amphetamines, Ur Screen POSITIVE (A) NONE DETECTED   MDMA (Ecstasy)Ur Screen NONE DETECTED NONE DETECTED   Cocaine Metabolite,Ur [place] NONE DETECTED NONE DETECTED   Opiate, Ur Screen NONE DETECTED NONE DETECTED   Phencyclidine (PCP) Ur S NONE DETECTED NONE DETECTED   Cannabinoid 50 Ng, Ur [place] NONE DETECTED NONE DETECTED   Barbiturates, Ur Screen NONE DETECTED NONE DETECTED   Benzodiazepine, Ur Scrn NONE DETECTED NONE DETECTED   Methadone Scn, Ur NONE DETECTED NONE DETECTED  Type and screen [facility]     Status: None   Collection Time: 01/29/18  9:32 AM  Result Value Ref Range   ABO/RH(D) A POS    Antibody Screen NEG    Sample Expiration      02/01/2018 Performed at [facility], [address] [place], [place] [address]   CBC     Status: Abnormal   Collection Time: 01/30/18  4:17 AM  Result Value Ref Range   WBC 15.9 (H) 3.6 - 11.0 K/uL   RBC 3.54 (L) 3.80 - 5.20 MIL/uL   Hemoglobin 10.8 (L) 12.0 - 16.0 g/dL   HCT 33.3 (L) 35.0 - 47.0 %   MCV 93.9 80.0 - 100.0 fL   MCH 30.4 26.0 - 34.0 pg   MCHC 32.4 32.0 - 36.0 g/dL   RDW 15.5 (H) 11.5 - 14.5 %   Platelets 255 150 - 440 K/uL  Basic metabolic panel     Status: Abnormal   Collection Time: 01/30/18  4:17 AM  Result Value Ref Range   Sodium 139 135 - 145 mmol/L   Potassium 3.9 3.5 - 5.1 mmol/L   Chloride 104 101 - 111 mmol/L   CO2 28 22 - 32 mmol/L   Glucose, Bld 197 (H) 65 - 99 mg/dL   BUN 10 6 - 20 mg/dL   Creatinine, Ser 0.98 0.44 - 1.00 mg/dL   Calcium 8.2 (L) 8.9 - 10.3 mg/dL   GFR calc non Af Amer >60 >60 mL/min   GFR calc Af Amer >60 >60 mL/min   Anion gap 7 5 - 15    Dg Knee Left Port  Result Date: 01/29/2018 CLINICAL DATA:  Status post left knee replacement EXAM: PORTABLE LEFT KNEE - 1-2 VIEW COMPARISON:  None. FINDINGS: Left knee prosthesis is noted in satisfactory position. No acute bony or soft tissue abnormality is noted. IMPRESSION: Status post left knee replacement. Electronically Signed   By: Inez Catalina M.D.   On: 01/29/2018 14:43    Assessment/Plan: 1 Day Post-Op   Active  Problems:   Osteoarthritis of left knee   Advance diet Up with therapy D/C IV fluids Discharge to SNF likely tomorrow   Lovell Sheehan , MD 01/30/2018, 7:54 AM

## 2018-01-30 NOTE — Anesthesia Postprocedure Evaluation (Signed)
Anesthesia Post Note  Patient: Maria Warren  Procedure(s) Performed: TOTAL KNEE ARTHROPLASTY (Left Knee)  Patient location during evaluation: PACU Anesthesia Type: Spinal Level of consciousness: awake and alert Pain management: pain level controlled Vital Signs Assessment: post-procedure vital signs reviewed and stable Respiratory status: spontaneous breathing, nonlabored ventilation, respiratory function stable and patient connected to nasal cannula oxygen Cardiovascular status: blood pressure returned to baseline and stable Postop Assessment: no apparent nausea or vomiting Anesthetic complications: no     Last Vitals:  Vitals:   01/30/18 0419 01/30/18 0726  BP: 127/78 (!) 110/56  Pulse: 61 70  Resp: 18   Temp: (!) 36.3 C 36.6 C  SpO2: 96% 93%    Last Pain:  Vitals:   01/30/18 0726  TempSrc: Oral  PainSc:                  Molli Barrows

## 2018-01-30 NOTE — Evaluation (Signed)
Physical Therapy Evaluation Patient Details Name: Maria Warren MRN: [id number] DOB: 10-Jan-1956 Today's Date: 01/30/2018   History of Present Illness  62 y/o female s/p L TKA 01/30/18.  Clinical Impression  Pt did very well for first PT session post op.  She was able to ambulate with consistent and confident cadence ~60 ft, showed ability to do SLRs with minimal warm up, had >100 degrees of flexion and generally has surpassed typical POD1 goals with relative ease.  Pt should be able to return home on discharge with intermittent assist for errands, etc.      Follow Up Recommendations Home health PT    Equipment Recommendations  Rolling walker with 5" wheels(pt reports that walker she has is falling apart)    Recommendations for Other Services       Precautions / Restrictions Precautions Precautions: Fall Restrictions Weight Bearing Restrictions: Yes LLE Weight Bearing: Weight bearing as tolerated      Mobility  Bed Mobility               General bed mobility comments: pt in recliner on arrival  Transfers Overall transfer level: Independent Equipment used: Rolling walker (2 wheeled)             General transfer comment: Pt able to rise and maintain balance w/o walker, good control, no issues  Ambulation/Gait Ambulation/Gait assistance: Supervision Ambulation Distance (Feet): 60 Feet Assistive device: Rolling walker (2 wheeled)       General Gait Details: Pt with very little hesitancy/limp.  She was able to maintain consistent cadence from the first steps and showed good confidence and safety with the effort.   Stairs            Wheelchair Mobility    Modified Rankin (Stroke Patients Only)       Balance Overall balance assessment: Modified Independent                                           Pertinent Vitals/Pain Pain Assessment: No/denies pain    Home Living Family/patient expects to be discharged to:: Private  residence Living Arrangements: Alone Available Help at Discharge: Family;Friend(s)   Home Access: Level entry     Home Layout: One level Home Equipment: Environmental consultant - 2 wheels(reports that her walker is not in disrepair)      Prior Function Level of Independence: Independent(pt has been using her old walker recently)         Comments: Pt does not drive, friend drives her to grocery store, etc and she will do the shopping     Hand Dominance        Extremity/Trunk Assessment   Upper Extremity Assessment Upper Extremity Assessment: Overall WFL for tasks assessed    Lower Extremity Assessment Lower Extremity Assessment: Overall WFL for tasks assessed(expected post op weakness)       Communication   Communication: No difficulties  Cognition Arousal/Alertness: Awake/alert Behavior During Therapy: WFL for tasks assessed/performed Overall Cognitive Status: Within Functional Limits for tasks assessed                                        General Comments      Exercises Total Joint Exercises Ankle Circles/Pumps: AROM;10 reps Quad Sets: Strengthening;10 reps Gluteal Sets: Strengthening;10 reps Short Arc Quad:  AROM;10 reps Heel Slides: Strengthening;10 reps Hip ABduction/ADduction: Strengthening;10 reps Straight Leg Raises: AROM;10 reps Knee Flexion: PROM;5 reps Goniometric ROM: 0-102   Assessment/Plan    PT Assessment Patient needs continued PT services  PT Problem List Decreased strength;Decreased range of motion;Decreased activity tolerance;Decreased balance;Decreased mobility;Decreased coordination;Decreased knowledge of use of DME;Decreased knowledge of precautions;Decreased safety awareness;Pain       PT Treatment Interventions DME instruction;Gait training;Functional mobility training;Therapeutic activities;Therapeutic exercise;Balance training;Neuromuscular re-education;Patient/family education    PT Goals (Current goals can be found in the  Care Plan section)  Acute Rehab PT Goals Patient Stated Goal: get back to normal PT Goal Formulation: With patient Time For Goal Achievement: 02/13/18 Potential to Achieve Goals: Good    Frequency BID   Barriers to discharge        Co-evaluation               AM-PAC PT "6 Clicks" Daily Activity  Outcome Measure Difficulty turning over in bed (including adjusting bedclothes, sheets and blankets)?: A Little Difficulty moving from lying on back to sitting on the side of the bed? : A Little Difficulty sitting down on and standing up from a chair with arms (e.g., wheelchair, bedside commode, etc,.)?: None Help needed moving to and from a bed to chair (including a wheelchair)?: None Help needed walking in hospital room?: None Help needed climbing 3-5 steps with a railing? : A Little 6 Click Score: 21    End of Session Equipment Utilized During Treatment: Gait belt Activity Tolerance: Patient tolerated treatment well Patient left: with chair alarm set;with call bell/phone within reach;with family/visitor present Nurse Communication: Mobility status PT Visit Diagnosis: Muscle weakness (generalized) (M62.81);Difficulty in walking, not elsewhere classified (R26.2)    Time: 8280-0349 PT Time Calculation (min) (ACUTE ONLY): 26 min   Charges:   PT Evaluation $PT Eval Low Complexity: 1 Low PT Treatments $Therapeutic Exercise: 8-22 mins   PT G Codes:        Kreg Shropshire, DPT 01/30/2018, 9:51 AM

## 2018-01-30 NOTE — Progress Notes (Signed)
Physical Therapy Treatment Patient Details Name: Maria Warren MRN: [id number] DOB: 09-03-1956 Today's Date: 01/30/2018    History of Present Illness 62 y/o female s/p L TKA 01/30/18.    PT Comments    Pt continues to do very well with POD1 PT.  She was able to fully circumambulate the nurses' station with consistent and appropriate cadence/speed. She easily got to/from EOB and displays great quad control, strength and ROM.  Pt reporting 7/10 pain during session but is not at all pain limited and showed no overt signs of discomfort with all acts.   Follow Up Recommendations  Home health PT     Equipment Recommendations  Rolling walker with 5" wheels    Recommendations for Other Services       Precautions / Restrictions Precautions Precautions: Fall Restrictions LLE Weight Bearing: Weight bearing as tolerated    Mobility  Bed Mobility Overal bed mobility: Independent             General bed mobility comments: Pt easily gets out of and back into bed w/o assist  Transfers Overall transfer level: Independent Equipment used: Rolling walker (2 wheeled)             General transfer comment: Again independent with stand<>sit, minimal reliance on UEs  Ambulation/Gait Ambulation/Gait assistance: Supervision Ambulation Distance (Feet): 200 Feet Assistive device: Rolling walker (2 wheeled)       General Gait Details: Pt able to maintain consistent speed/cadence t/o the entire bout of ambulation and had only very minimal fatigue.  She showed little reliance on UEs and had no LOBs or overt safety concerns.   Stairs            Wheelchair Mobility    Modified Rankin (Stroke Patients Only)       Balance Overall balance assessment: Modified Independent                                          Cognition Arousal/Alertness: Awake/alert Behavior During Therapy: WFL for tasks assessed/performed;Impulsive Overall Cognitive Status: Within  Functional Limits for tasks assessed                                        Exercises Total Joint Exercises Ankle Circles/Pumps: AROM;10 reps Quad Sets: Strengthening;10 reps Gluteal Sets: Strengthening;10 reps Short Arc Quad: AROM;10 reps Heel Slides: Strengthening;10 reps Hip ABduction/ADduction: Strengthening;10 reps Straight Leg Raises: AROM;10 reps Knee Flexion: AROM;10 reps Goniometric ROM: 0-110 (AROM)    General Comments        Pertinent Vitals/Pain Pain Assessment: 0-10 Pain Score: 7  Pain Location: pt with no grimacing t/o session, even during ROM acts    Home Living                      Prior Function            PT Goals (current goals can now be found in the care plan section) Progress towards PT goals: Progressing toward goals    Frequency    BID      PT Plan Current plan remains appropriate    Co-evaluation              AM-PAC PT "6 Clicks" Daily Activity  Outcome Measure  Difficulty turning over in bed (including  adjusting bedclothes, sheets and blankets)?: None Difficulty moving from lying on back to sitting on the side of the bed? : None Difficulty sitting down on and standing up from a chair with arms (e.g., wheelchair, bedside commode, etc,.)?: None Help needed moving to and from a bed to chair (including a wheelchair)?: None Help needed walking in hospital room?: None Help needed climbing 3-5 steps with a railing? : A Little 6 Click Score: 23    End of Session Equipment Utilized During Treatment: Gait belt Activity Tolerance: Patient tolerated treatment well Patient left: with call bell/phone within reach;with family/visitor present;with bed alarm set   PT Visit Diagnosis: Muscle weakness (generalized) (M62.81);Difficulty in walking, not elsewhere classified (R26.2)     Time: 3112-1624 PT Time Calculation (min) (ACUTE ONLY): 25 min  Charges:  $Gait Training: 8-22 mins $Therapeutic Exercise: 8-22  mins                    G Codes:       Kreg Shropshire, DPT 01/30/2018, 3:11 PM

## 2018-01-30 NOTE — Progress Notes (Signed)
Patient dangled to side of bed with minimal assist and no signs of discomfort.

## 2018-01-30 NOTE — Progress Notes (Signed)
MD ordered robaxin for muscle spasms. Patient complaining of discomfort in her throat. MD notified. Orders received.

## 2018-01-30 NOTE — Care Management Note (Signed)
Case Management Note  Patient Details  Name: Maria Warren MRN: [id number] Date of Birth: November 06, 1955  Subjective/Objective:  POD # 1 left total knee arthroplasty. Met with patient at bedside to discuss discharge planning. Family at bedside as well. She lives alone but states she has family and friends that can come in and check on her if needed. Discuss PT recommendations. She is agreeable to home health. Offered a list of agencies. Referral  to [facility] for HHPT. Patient will need a walker. Ordered from [place] with [facility].    Action/Plan: [facility] for HHPT, [facility] for [employer].    Expected Discharge Date:                  Expected Discharge Plan:  [place]  In-House Referral:     Discharge planning Services  CM Consult  Post Acute Care Choice:  Durable Medical Equipment, Home Health Choice offered to:  Patient  DME Arranged:  Walker rolling DME Agency:  [place]:  PT [place] Agency:  [facility] ([facility])  Status of Service:  In process, will continue to follow  If discussed at Long Length of Stay Meetings, dates discussed:    Additional Comments:  Jolly Mango, RN 01/30/2018, 9:35 AM

## 2018-01-31 ENCOUNTER — Encounter: Payer: Self-pay | Admitting: Orthopedic Surgery

## 2018-01-31 LAB — CBC
HEMATOCRIT: 29.4 % — AB (ref 35.0–47.0)
HEMOGLOBIN: 9.6 g/dL — AB (ref 12.0–16.0)
MCH: 30.2 pg (ref 26.0–34.0)
MCHC: 32.6 g/dL (ref 32.0–36.0)
MCV: 92.7 fL (ref 80.0–100.0)
Platelets: 232 10*3/uL (ref 150–440)
RBC: 3.17 MIL/uL — ABNORMAL LOW (ref 3.80–5.20)
RDW: 15.7 % — AB (ref 11.5–14.5)
WBC: 12.1 10*3/uL — ABNORMAL HIGH (ref 3.6–11.0)

## 2018-01-31 MED ORDER — ASPIRIN 325 MG PO TBEC: 325.0000 mg | DELAYED_RELEASE_TABLET | Freq: Every day | ORAL | 0 refills | Status: DC

## 2018-01-31 MED ORDER — OXYCODONE HCL 5 MG PO TABS: 5.0000 mg | ORAL_TABLET | ORAL | 0 refills | Status: DC | PRN

## 2018-01-31 MED ORDER — DOCUSATE SODIUM 100 MG PO CAPS: 100.0000 mg | ORAL_CAPSULE | Freq: Two times a day (BID) | ORAL | 0 refills | Status: DC

## 2018-01-31 NOTE — Progress Notes (Signed)
PT notified this RN that patient's pain is 100/10. RN walked into room to find patient snoring, VSS, Gave Oxy 5mg  at 0930 this am. Is able to arouse, but falls back to sleep within seconds of talking with her. Bed alarm on for safety.

## 2018-01-31 NOTE — Progress Notes (Signed)
S 62 yo F POD 2 left total knee arthroplasty with Dr. Harlow Mares Nerve block wore off overnight, she has marked knee pain this morning. She has been nauseated and vomiting when she tries standing due to pain.   Pain had been well controlled and she was doing well with PT yesterday.  PT will see her this morning and evaluate for discharge to home status.   O  Alert and oriented. Patient on commode. Dressing clean dry and intact of the left knee. Mild diffuse knee swelling. Neurovascular intact left lower extremity 2+ pedal pulses and intact distal sensation.   A 62 yo F POD 2 left total knee arthroplasty with Dr. Sandrea Hughs Continue regular pain medication and ice for pain.  PT will work with patient today and make recommendations based on pain.  Possible home health recommendation  Wyatt Portela PA-C

## 2018-01-31 NOTE — Clinical Social Work Placement (Signed)
   CLINICAL SOCIAL WORK PLACEMENT  NOTE  Date:  01/31/2018  Patient Details  Name: Maria Warren MRN: [id number] Date of Birth: [birth year]  Clinical Social Work is seeking post-discharge placement for this patient at the [place] level of care (*CSW will initial, date and re-position this form in  chart as items are completed):  Yes   Patient/family provided with [place] Work Department's list of facilities offering this level of care within the geographic area requested by the patient (or if unable, by the patient's family).  Yes   Patient/family informed of their freedom to choose among providers that offer the needed level of care, that participate in Medicare, Medicaid or managed care program needed by the patient, have an available bed and are willing to accept the patient.  Yes   Patient/family informed of [facility]'s ownership interest in [facility] and [facility], as well as of the fact that they are under no obligation to receive care at these facilities.  PASRR submitted to EDS on 01/29/18     PASRR number received on 01/30/18     Existing PASRR number confirmed on       FL2 transmitted to all facilities in geographic area requested by pt/family on 01/30/18     FL2 transmitted to all facilities within larger geographic area on       Patient informed that his/her managed care company has contracts with or will negotiate with certain facilities, including the following:        Yes   Patient/family informed of bed offers received.  Patient chooses bed at [facility] )     Physician recommends and patient chooses bed at      Patient to be transferred to   on  .  Patient to be transferred to facility by       Patient family notified on   of transfer.  Name of family member notified:        PHYSICIAN       Additional Comment:    _______________________________________________ Erice Ahles, Veronia Beets,  LCSW 01/31/2018, 3:22 PM

## 2018-01-31 NOTE — Care Management Note (Signed)
Case Management Note  Patient Details  Name: Maria Warren MRN: [id number] Date of Birth: 04/06/1956  Subjective/Objective:  Discharging today                  Action/Plan: [facility] notified of discharge. DME delivered  Expected Discharge Date:  01/31/18               Expected Discharge Plan:  [place]  In-House Referral:     Discharge planning Services  CM Consult  Post Acute Care Choice:  Durable Medical Equipment, Home Health Choice offered to:  Patient  DME Arranged:  Walker rolling DME Agency:  [place]:  PT [place] Agency:  [facility] ([facility]  Status of Service:  Completed, signed off  If discussed at [employer] of Stay Meetings, dates discussed:    Additional Comments:  Jolly Mango, RN 01/31/2018, 1:57 PM

## 2018-01-31 NOTE — Progress Notes (Signed)
PT Cancellation Note  Patient Details Name: Maria Warren MRN: [id number] DOB: 1956/02/20   Cancelled Treatment:      PT consult received, chart reviewed. Pt very lethargic and  reports 10/10 pain (Pt premedicated). Pt refused PT at this time.   Will reattempt treatment at a later date/time when medically appropriate.   Tnya Ades Mondrian-Pardue, SPT 01/31/2018, 11:30 AM

## 2018-01-31 NOTE — Discharge Summary (Addendum)
Physician Discharge Summary  Patient ID: Maria Warren MRN: [id number] DOB/AGE: 1955/12/25 62 y.o.  Admit date: 01/29/2018 Discharge date: 02/01/2018  Admission Diagnoses:  M17.0 Bilateral Primary osteoarthritis of knee <principal problem not specified>  Discharge Diagnoses:  M17.0 Bilateral Primary osteoarthritis of knee Active Problems:   Osteoarthritis of left knee   Past Medical History:  Diagnosis Date  . Anxiety   . Arthritis    knees, lower back  . COPD (chronic obstructive pulmonary disease) ([place])   . Coronary artery disease   . Depression   . GERD (gastroesophageal reflux disease)   . Headache    Pain over left eye, then vision in left eye goes black, quickly resolves.  Said Dr. Melony Overly office aware.  . Hypertension   . Hypothyroidism   . Thyroid disease   . Wears dentures    full upper    Surgeries: Procedure(s): TOTAL KNEE ARTHROPLASTY on 01/29/2018   Consultants (if any):   Discharged Condition: Improved  Hospital Course: Maria Warren is an 62 y.o. female who was admitted 01/29/2018 with a diagnosis of  M17.0 Bilateral Primary osteoarthritis of knee <principal problem not specified> and went to the operating room on 01/29/2018 and underwent the above named procedures.    She was given perioperative antibiotics:  Anti-infectives (From admission, onward)   Start     Dose/Rate Route Frequency Ordered Stop   01/29/18 1600  ceFAZolin (ANCEF) IVPB 2g/100 mL premix     2 g 200 mL/hr over 30 Minutes Intravenous Every 6 hours 01/29/18 1507 01/29/18 2328   01/29/18 1148  50,000 units bacitracin in 0.9% normal saline 250 mL irrigation  Status:  Discontinued       As needed 01/29/18 1150 01/29/18 1317   01/29/18 0936  ceFAZolin (ANCEF) 2-4 GM/100ML-% IVPB    Note to Pharmacy:  Josephina Shih   : cabinet override      01/29/18 0936 01/29/18 1121   01/29/18 0600  ceFAZolin (ANCEF) IVPB 2g/100 mL premix     2 g 200 mL/hr over 30 Minutes Intravenous On call to O.R.  01/28/18 2214 01/29/18 1126    .  She was given sequential compression devices, early ambulation, and ECASA for DVT prophylaxis.  She benefited maximally from the hospital stay and there were no complications.    Recent vital signs:  Vitals:   02/01/18 0819 02/01/18 0921  BP: (!) 155/83   Pulse: 86   Resp: 18   Temp: 99.2 F (37.3 C)   SpO2: 94% 95%    Recent laboratory studies:  Lab Results  Component Value Date   HGB 10.0 (L) 02/01/2018   HGB 9.6 (L) 01/31/2018   HGB 10.8 (L) 01/30/2018   Lab Results  Component Value Date   WBC 12.9 (H) 02/01/2018   PLT 239 02/01/2018   Lab Results  Component Value Date   INR 0.93 01/17/2018   Lab Results  Component Value Date   NA 139 01/30/2018   K 3.9 01/30/2018   CL 104 01/30/2018   CO2 28 01/30/2018   BUN 10 01/30/2018   CREATININE 0.98 01/30/2018   GLUCOSE 197 (H) 01/30/2018    Discharge Medications:   Allergies as of 02/01/2018      Reactions   Lyrica [pregabalin] Nausea Only   Topamax [topiramate] Nausea Only   Zoloft [sertraline Hcl] Nausea Only   Bupropion Nausea Only   Light headed      Medication List    TAKE these medications   acetaminophen  650 MG CR tablet Commonly known as:  TYLENOL Take 650 mg by mouth every 8 (eight) hours as needed for pain.   albuterol 108 (90 Base) MCG/ACT inhaler Commonly known as:  PROVENTIL HFA;VENTOLIN HFA Inhale 2 puffs into the lungs every 4 (four) hours as needed for wheezing or shortness of breath.   amphetamine-dextroamphetamine 10 MG 24 hr capsule Commonly known as:  ADDERALL XR Take 10 mg by mouth daily.   aspirin 325 MG EC tablet Take 1 tablet (325 mg total) by mouth daily with breakfast. What changed:    medication strength  how much to take  when to take this   atorvastatin 80 MG tablet Commonly known as:  LIPITOR Take 80 mg by mouth daily.   busPIRone 15 MG tablet Commonly known as:  BUSPAR Take 15 mg by mouth 2 (two) times daily.   calcitRIOL  0.25 MCG capsule Commonly known as:  ROCALTROL Take 1 capsule (0.25 mcg total) by mouth daily. What changed:    how much to take  when to take this   cholecalciferol 1000 units tablet Commonly known as:  VITAMIN D Take 1,000 Units by mouth daily.   clonazePAM 0.5 MG tablet Commonly known as:  KLONOPIN Take 0.5 mg by mouth 4 (four) times daily as needed for anxiety.   diphenhydrAMINE 25 mg capsule Commonly known as:  BENADRYL Take 25 mg by mouth every 6 (six) hours as needed (for nose/watery eyes).   docusate sodium 100 MG capsule Commonly known as:  COLACE Take 1 capsule (100 mg total) by mouth 2 (two) times daily.   escitalopram 20 MG tablet Commonly known as:  LEXAPRO Take 20 mg by mouth daily.   Fluticasone-Salmeterol 250-50 MCG/DOSE Aepb Commonly known as:  ADVAIR Inhale 1 puff into the lungs 2 (two) times daily as needed (for respiratory issues.).   isosorbide mononitrate 30 MG 24 hr tablet Commonly known as:  IMDUR Take 30 mg by mouth daily.   levothyroxine 150 MCG tablet Commonly known as:  SYNTHROID, LEVOTHROID Take 150 mcg by mouth daily before breakfast.   Melatonin 5 MG Tabs Take 5 mg by mouth at bedtime as needed (for sleep).   nitroGLYCERIN 0.4 MG SL tablet Commonly known as:  NITROSTAT Place 0.4 mg under the tongue every 5 (five) minutes as needed for chest pain.   omeprazole 20 MG capsule Commonly known as:  PRILOSEC Take 20 mg by mouth daily.   oxyCODONE 5 MG immediate release tablet Commonly known as:  Oxy IR/ROXICODONE Take 1-2 tablets (5-10 mg total) by mouth every 4 (four) hours as needed for moderate pain (pain score 4-6).   polyethylene glycol packet Commonly known as:  MIRALAX / GLYCOLAX Take 17 g by mouth daily as needed (for constipation.).   tiotropium 18 MCG inhalation capsule Commonly known as:  SPIRIVA Place 18 mcg into inhaler and inhale daily.   traMADol 50 MG tablet Commonly known as:  ULTRAM Take 50-100 mg by mouth  every 6 (six) hours as needed (for pain).   traZODone 150 MG tablet Commonly known as:  DESYREL Take 1 tablet (150 mg total) by mouth at bedtime.   vitamin B-12 500 MCG tablet Commonly known as:  CYANOCOBALAMIN Take 1,000 mcg by mouth daily.            Durable Medical Equipment  (From admission, onward)        Start     Ordered   01/30/18 0934  For home use only DME Walker rolling  Once    Question:  Patient needs a walker to treat with the following condition  Answer:  Weakness   01/30/18 0933      Diagnostic Studies: Dg Knee Left Port  Result Date: 01/29/2018 CLINICAL DATA:  Status post left knee replacement EXAM: PORTABLE LEFT KNEE - 1-2 VIEW COMPARISON:  None. FINDINGS: Left knee prosthesis is noted in satisfactory position. No acute bony or soft tissue abnormality is noted. IMPRESSION: Status post left knee replacement. Electronically Signed   By: Inez Catalina M.D.   On: 01/29/2018 14:43    Disposition:   Discharge Instructions    Face-to-face encounter (required for Medicare/Medicaid patients)   Complete by:  As directed    I Lovell Sheehan certify that this patient is under my care and that I, or a nurse practitioner or physician's assistant working with me, had a face-to-face encounter that meets the physician face-to-face encounter requirements with this patient on 01/31/2018. The encounter with the patient was in whole, or in part for the following medical condition(s) which is the primary reason for home health care (List medical condition): total knee replacement   The encounter with the patient was in whole, or in part, for the following medical condition, which is the primary reason for home health care:  total knee replacement   I certify that, based on my findings, the following services are medically necessary home health services:  Physical therapy   Reason for Medically Necessary Home Health Services:  Therapy- Therapeutic Exercises to Increase Strength and  Endurance   My clinical findings support the need for the above services:  Unable to leave home safely without assistance and/or assistive device   Further, I certify that my clinical findings support that this patient is homebound due to:  Unable to leave home safely without assistance   Home Health   Complete by:  As directed    To provide the following care/treatments:  PT      Contact information for after-discharge care    [facility] .   Service:  Skilled Nursing Contact information: [place] [phone]               Signed: Lovell Sheehan ,MD 02/01/2018, 1:45 PM

## 2018-01-31 NOTE — Progress Notes (Signed)
Patient still sleepy this afternoon.  Assist x2 with PT, Desat with activity. 2L O2 applied by PT.  Notified MD, hold off discharge.

## 2018-01-31 NOTE — Clinical Social Work Note (Addendum)
Clinical Social Work Assessment  Patient Details  Name: Maria Warren MRN: [id number] Date of Birth: Jan 12, 1956  Date of referral:  01/31/18               Reason for consult:  Facility Placement                Permission sought to share information with:  Chartered certified accountant granted to share information::  Yes, Verbal Permission Granted  Name::      [place]::   [place]   Relationship::     Contact Information:     Housing/Transportation Living arrangements for the past 2 months:  [place] of Information:  Patient Patient Interpreter Needed:  None Criminal Activity/Legal Involvement Pertinent to Current Situation/Hospitalization:  No - Comment as needed Significant Relationships:  Adult Children Lives with:  Self Do you feel safe going back to the place where you live?  Yes Need for family participation in patient care:  Yes (Comment)  Care giving concerns:  Patient lives alone in [place].    Social Worker assessment / plan:  Holiday representative (CSW) received SNF consult. PT recommended home health on post op day 1 however on post op day 2 patient has been in pain and not able to participate with PT. CSW met with patient alone at bedside to discuss D/C plan. Patient was sleeping but easily woke up. CSW introduced self and explained role of CSW department. Patient reported that she lives alone and is now agreeable to SNF. CSW explained that [facility] will have to approve SNF. Patient is agreeable to SNF search in [facility]. FL2 complete and faxed out.   CSW presented bed offers to patient and she chose [facility]. Per [facility] admissions coordinator at [facility] she will start [facility] SNF authorization. CSW will continue to follow and assist as needed.   CSW attempted to contact patient's emergency contacts Anderson Malta and Horris Latino however they did not answer and voicemails were left.   Patient's  friend Anderson Malta called CSW back and was made aware of above.   Employment status:  Disabled (Comment on whether or not currently receiving Disability) Insurance information:  Programmer, applications, Medicaid In [place] PT Recommendations:  Home with [place] / Referral to community resources:  [place]  Patient/Family's Response to care:  Patient chose [facility].   Patient/Family's Understanding of and Emotional Response to Diagnosis, Current Treatment, and Prognosis:  Patient was very pleasant and thanked CSW for assistance.   Emotional Assessment Appearance:    Attitude/Demeanor/Rapport:    Affect (typically observed):  Pleasant Orientation:  Oriented to Self, Oriented to Place, Oriented to  Time, Oriented to Situation Alcohol / Substance use:  Not Applicable Psych involvement (Current and /or in the community):  No (Comment)  Discharge Needs  Concerns to be addressed:  Discharge Planning Concerns Readmission within the last 30 days:  No Current discharge risk:  Dependent with Mobility Barriers to Discharge:  Continued Medical Work up   [employer], Veronia Beets, LCSW 01/31/2018, 3:24 PM

## 2018-01-31 NOTE — Discharge Instructions (Signed)
Continue weight bear as tolerated on the left lower extremity.    Elevate the left lower extremity whenever possible and continue the polar care while elevating the extremity. Patient may shower. No bath or submerging the wound.    Take Aspirin as directed for blood clot prevention.  Continue to work on knee range of motion exercises at home as instructed by physical therapy. Continue to use a walker for assistance with ambulation until cleared by physical therapy.  Call [phone] with any questions, such as fever > 101.5 degrees, drainage from the wound or shortness of breath.  

## 2018-01-31 NOTE — Care Management (Signed)
[facility] notified of delay in discharge

## 2018-01-31 NOTE — Progress Notes (Signed)
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    PT Comments    Pt lethargic upon PT arrival. Pt demonstrated bed mobility, transfers, and ambulation (7 ft) (mod assist x 2 physical assistance). Pt reported feeling fatigued, increased L knee pain (10/10), and nauseous w/ ambulation (7 ft) and requested to sit. Pt sat in recliner and O2 monitored; Pt desat to 82%; Pt instructed in pursed lipped breathing and O2 sat did not improve; RN notified and 2L oxygen initiated; O2 sats improved within a few minutes to 97%; O2 sats monitored for a few minutes while Pt fell asleep and O2 sats remained 94-96%; RN notified. PT will focus on progression of strength, activity tolerance, and L knee ROM at next session.   Follow Up Recommendations  SNF     Equipment Recommendations  Rolling walker with 5" wheels    Recommendations for Other Services       Precautions / Restrictions Precautions Precautions: Fall Restrictions Weight Bearing Restrictions: Yes LLE Weight Bearing: Weight bearing as tolerated    Mobility  Bed Mobility Overal bed mobility: Needs Assistance Bed Mobility: Supine to Sit     Supine to sit: +2 for physical assistance     General bed mobility comments: assist to bring trunk forward and lower BLE to floor.  Transfers Overall transfer level: Needs assistance Equipment used: Rolling walker (2 wheeled) Transfers: Sit to/from Stand Sit to Stand: +2 physical assistance;Mod assist         General transfer comment: assist for lift; vc's to bring R heel closer to bed, push through one hand on bed and one hand on walker.  Ambulation/Gait Ambulation/Gait assistance: +2 physical assistance;Mod assist Ambulation Distance (Feet): 7 Feet Assistive device: Rolling walker (2 wheeled) Gait Pattern/deviations: Decreased step length -  left;Decreased step length - right;Step-to pattern;Decreased stance time - left Gait velocity: decreased   General Gait Details: assist for stability; vc's to push through BUE on RW, stepping pattern, and postural adjustments.    Stairs            Wheelchair Mobility    Modified Rankin (Stroke Patients Only)       Balance Overall balance assessment: Needs assistance Sitting-balance support: Bilateral upper extremity supported;Feet unsupported Sitting balance-Leahy Scale: Poor Sitting balance - Comments: asssist to keep trunk upright Postural control: Posterior lean;Right lateral lean   Standing balance-Leahy Scale: Poor Standing balance comment: RW mod assist x 2 for stability and safety at EOB                            Cognition Arousal/Alertness: Lethargic Behavior During Therapy: Flat affect Overall Cognitive Status: Difficult to assess                                 General Comments: Pt A&O x 4. Niece and nephew present and reported she has been sleepy.      Exercises Total Joint Exercises Goniometric ROM: L knee AROM -4 degrees from neutral to 80 degrees flexion    General Comments  Pt agreeable to session.      Pertinent Vitals/Pain Pain Assessment: 0-10 Pain Score: 10-Worst pain ever Pain Location: L knee Pain Descriptors / Indicators: Operative site guarding;Grimacing;Aching;Sore Pain Intervention(s): Limited activity within patient's tolerance;Monitored during session;Premedicated  before session(RN notified)    Home Living                      Prior Function            PT Goals (current goals can now be found in the care plan section) Acute Rehab PT Goals Patient Stated Goal: get back to normal PT Goal Formulation: With patient Time For Goal Achievement: 02/13/18 Potential to Achieve Goals: Fair Progress towards PT goals: Progressing toward goals    Frequency    BID      PT Plan Discharge plan  needs to be updated    Co-evaluation              AM-PAC PT "6 Clicks" Daily Activity  Outcome Measure  Difficulty turning over in bed (including adjusting bedclothes, sheets and blankets)?: Unable Difficulty moving from lying on back to sitting on the side of the bed? : Unable Difficulty sitting down on and standing up from a chair with arms (e.g., wheelchair, bedside commode, etc,.)?: Unable Help needed moving to and from a bed to chair (including a wheelchair)?: Total Help needed walking in hospital room?: Total Help needed climbing 3-5 steps with a railing? : Total 6 Click Score: 6    End of Session Equipment Utilized During Treatment: Gait belt;Oxygen Activity Tolerance: Patient limited by lethargy;Patient limited by pain;Other (comment)(desat to 82% w/ activity; RN notified; 2L oxygen initiated) Patient left: in chair;with call bell/phone within reach;with chair alarm set;with family/visitor present;with SCD's reapplied(B heels elevated by towel rolls; Polar care applied and activated) Nurse Communication: Mobility status;Other (comment)(O2 desat (see activity tolerance for details)) PT Visit Diagnosis: Muscle weakness (generalized) (M62.81);Difficulty in walking, not elsewhere classified (R26.2);Other abnormalities of gait and mobility (R26.89)     Time: 1308-6578 PT Time Calculation (min) (ACUTE ONLY): 35 min  Charges:                       G Codes:        Ariston Grandison Mondrian-Pardue, SPT 01/31/2018, 2:46 PM

## 2018-02-01 LAB — CBC
HCT: 30.7 % — ABNORMAL LOW (ref 35.0–47.0)
Hemoglobin: 10 g/dL — ABNORMAL LOW (ref 12.0–16.0)
MCH: 30.5 pg (ref 26.0–34.0)
MCHC: 32.7 g/dL (ref 32.0–36.0)
MCV: 93.2 fL (ref 80.0–100.0)
PLATELETS: 239 10*3/uL (ref 150–440)
RBC: 3.29 MIL/uL — ABNORMAL LOW (ref 3.80–5.20)
RDW: 15.8 % — ABNORMAL HIGH (ref 11.5–14.5)
WBC: 12.9 10*3/uL — ABNORMAL HIGH (ref 3.6–11.0)

## 2018-02-01 NOTE — Progress Notes (Signed)
Physical Therapy Treatment Patient Details Name: Maria Warren MRN: [id number] DOB: [birth year] Today's Date: 02/01/2018    History of Present Illness 62 y/o female s/p L TKA 01/30/18.    PT Comments    Reports feeling a bit better today.  Participated in exercises as described below. To edge of bed with min assist, stood with min assist x 2 and was able to walk to the door today on 2 lpm with O2 sats remaining 93-95% throughout session.  She needed verbal and some tactile cues for proper walker sequencing but overall improved gait and tolerance over yesterday.  Remained in recliner after session. She did have decreased ROM today and seemed to resist/fight stretching.   Follow Up Recommendations  SNF     Equipment Recommendations  Rolling walker with 5" wheels    Recommendations for Other Services       Precautions / Restrictions Precautions Precautions: Fall Restrictions Weight Bearing Restrictions: Yes LLE Weight Bearing: Weight bearing as tolerated    Mobility  Bed Mobility Overal bed mobility: Needs Assistance Bed Mobility: Supine to Sit     Supine to sit: Min assist        Transfers   Equipment used: Rolling walker (2 wheeled) Transfers: Sit to/from Stand Sit to Stand: Min assist;+2 physical assistance            Ambulation/Gait Ambulation/Gait assistance: Museum/gallery curator (Feet): 15 Feet Assistive device: Rolling walker (2 wheeled) Gait Pattern/deviations: Step-to pattern Gait velocity: decreased Gait velocity interpretation: <1.8 ft/sec, indicative of risk for recurrent falls General Gait Details: verbal cues for gait sequencing   Stairs            Wheelchair Mobility    Modified Rankin (Stroke Patients Only)       Balance Overall balance assessment: Needs assistance Sitting-balance support: Bilateral upper extremity supported;Feet unsupported Sitting balance-Leahy Scale: Fair   Postural control: Posterior lean;Right  lateral lean   Standing balance-Leahy Scale: Fair                              Cognition Arousal/Alertness: Awake/alert Behavior During Therapy: WFL for tasks assessed/performed Overall Cognitive Status: Within Functional Limits for tasks assessed                                        Exercises Total Joint Exercises Ankle Circles/Pumps: AROM;10 reps Quad Sets: Strengthening;10 reps Gluteal Sets: Strengthening;10 reps Short Arc Quad: AROM;10 reps Heel Slides: Strengthening;10 reps Hip ABduction/ADduction: Strengthening;10 reps Straight Leg Raises: AROM;10 reps Knee Flexion: AROM;5 reps Goniometric ROM: 3-70 - resisted ROM/stretching today    General Comments        Pertinent Vitals/Pain Pain Assessment: 0-10 Pain Score: 7  Pain Location: L knee Pain Descriptors / Indicators: Operative site guarding;Grimacing;Aching;Sore Pain Intervention(s): Limited activity within patient's tolerance;RN gave pain meds during session    Home Living                      Prior Function            PT Goals (current goals can now be found in the care plan section) Progress towards PT goals: Progressing toward goals    Frequency    BID      PT Plan Current plan remains appropriate    Co-evaluation  AM-PAC PT "6 Clicks" Daily Activity  Outcome Measure  Difficulty turning over in bed (including adjusting bedclothes, sheets and blankets)?: Unable Difficulty moving from lying on back to sitting on the side of the bed? : Unable Difficulty sitting down on and standing up from a chair with arms (e.g., wheelchair, bedside commode, etc,.)?: Unable Help needed moving to and from a bed to chair (including a wheelchair)?: A Little Help needed walking in hospital room?: A Little Help needed climbing 3-5 steps with a railing? : A Lot 6 Click Score: 11    End of Session Equipment Utilized During Treatment: Gait  belt;Oxygen Activity Tolerance: Patient tolerated treatment well Patient left: in chair;with chair alarm set;with nursing/sitter in room;with call bell/phone within reach Nurse Communication: Mobility status       Time: 7048-8891 PT Time Calculation (min) (ACUTE ONLY): 25 min  Charges:  $Gait Training: 8-22 mins $Therapeutic Exercise: 8-22 mins                    G Codes:       Chesley Noon, PTA 02/01/18, 9:26 AM

## 2018-02-01 NOTE — Progress Notes (Signed)
Patient is medically stable for D/C to [facility] today. Per [facility] admissions coordinator at [facility] patient can come today and [facility] SNF authorization has been received. RN will call report and arrange EMS for transport. Clinical Education officer, museum (CSW) sent D/C orders to [facility] via [place]. Patient is aware of above. Patient's son Octavia Bruckner was at bedside and aware of above. CSW contacted patient's friend Anderson Malta and made her aware of above. Please reconsult if future social work needs arise. CSW signing off.   [employer], LCSW [phone]

## 2018-02-01 NOTE — Clinical Social Work Placement (Signed)
   CLINICAL SOCIAL WORK PLACEMENT  NOTE  Date:  02/01/2018  Patient Details  Name: Maria Warren MRN: [id number] Date of Birth: 1955-11-08  Clinical Social Work is seeking post-discharge placement for this patient at the [place] level of care (*CSW will initial, date and re-position this form in  chart as items are completed):  Yes   Patient/family provided with [facility] [facility] Department's list of facilities offering this level of care within the geographic area requested by the patient (or if unable, by the patient's family).  Yes   Patient/family informed of their freedom to choose among providers that offer the needed level of care, that participate in Medicare, Medicaid or managed care program needed by the patient, have an available bed and are willing to accept the patient.  Yes   Patient/family informed of [facility]'s ownership interest in [facility] and [facility], as well as of the fact that they are under no obligation to receive care at these facilities.  PASRR submitted to EDS on 01/29/18     PASRR number received on 01/30/18     Existing PASRR number confirmed on       FL2 transmitted to all facilities in geographic area requested by pt/family on 01/30/18     FL2 transmitted to all facilities within larger geographic area on       Patient informed that his/her managed care company has contracts with or will negotiate with certain facilities, including the following:        Yes   Patient/family informed of bed offers received.  Patient chooses bed at [facility] )     Physician recommends and patient chooses bed at      Patient to be transferred to [facility] ) on 02/01/18.  Patient to be transferred to facility by [facility] )     Patient family notified on 02/01/18 of transfer.  Name of family member notified:  (Patient's son Octavia Bruckner and friend Anderson Malta are aware of D/C today. )      PHYSICIAN       Additional Comment:    _______________________________________________ Tuesday Terlecki, Veronia Beets, LCSW 02/01/2018, 2:15 PM

## 2018-02-01 NOTE — Progress Notes (Signed)
  Subjective:  Patient reports pain as moderate.  Feeling better today.  Objective:   VITALS:   Vitals:   01/31/18 1422 01/31/18 1717 01/31/18 2016 02/01/18 0008  BP:  (!) 157/79 (!) 161/78 (!) 162/85  Pulse:  85 84 88  Resp:  18  16  Temp:  98.8 F (37.1 C)  98 F (36.7 C)  TempSrc:  Oral  Oral  SpO2: 95% 92% 91% 96%  Weight:      Height:        PHYSICAL EXAM:  Sensation intact distally Intact pulses distally Dorsiflexion/Plantar flexion intact Incision: no drainage No cellulitis present Compartment soft  LABS  No results found for this or any previous visit (from the past 24 hour(s)).  No results found.  Assessment/Plan: 3 Days Post-Op   Active Problems:   Osteoarthritis of left knee   Up with therapy Discharge home with home health or skilled nursing based on progress today   Lovell Sheehan , MD 02/01/2018, 8:17 AM

## 2018-02-01 NOTE — Care Management Important Message (Signed)
Important Message  Patient Details  Name: Maria Warren MRN: [id number] Date of Birth: 05-01-1956   Medicare Important Message Given:  Yes    Juliann Pulse A Helaina Stefano 02/01/2018, 11:14 AM

## 2018-02-01 NOTE — Care Management Important Message (Signed)
Important Message  Patient Details  Name: Maria Warren MRN: [id number] Date of Birth: November 24, 1955   Medicare Important Message Given:  Yes    Juliann Pulse A Reathel Turi 02/01/2018, 11:14 AM

## 2018-02-02 NOTE — Progress Notes (Signed)
02/02/18: Clinical Social Worker ([place]) received a call from [employer] at [facility] stating that patient left [facility] AMA this morning. CSW made Dr. Harlow Mares aware of above.   [employer], LCSW [phone]

## 2018-03-14 ENCOUNTER — Emergency Department
Admission: EM | Admit: 2018-03-14 | Discharge: 2018-03-14 | Disposition: A | Discharge status: Home / Self Care | Payer: Medicare Other | Attending: Student in an Organized Health Care Education/Training Program | Admitting: Student in an Organized Health Care Education/Training Program

## 2018-03-14 ENCOUNTER — Encounter: Payer: Self-pay | Admitting: Emergency Medicine

## 2018-03-14 ENCOUNTER — Emergency Department: Payer: Medicare Other

## 2018-03-14 ENCOUNTER — Other Ambulatory Visit: Payer: Self-pay

## 2018-03-14 DIAGNOSIS — E039 Hypothyroidism, unspecified: Secondary | ICD-10-CM | POA: Diagnosis not present

## 2018-03-14 DIAGNOSIS — Y939 Activity, unspecified: Secondary | ICD-10-CM | POA: Insufficient documentation

## 2018-03-14 DIAGNOSIS — I2510 Atherosclerotic heart disease of native coronary artery without angina pectoris: Secondary | ICD-10-CM | POA: Insufficient documentation

## 2018-03-14 DIAGNOSIS — J449 Chronic obstructive pulmonary disease, unspecified: Secondary | ICD-10-CM | POA: Insufficient documentation

## 2018-03-14 DIAGNOSIS — Z79899 Other long term (current) drug therapy: Secondary | ICD-10-CM | POA: Insufficient documentation

## 2018-03-14 DIAGNOSIS — F17210 Nicotine dependence, cigarettes, uncomplicated: Secondary | ICD-10-CM | POA: Insufficient documentation

## 2018-03-14 DIAGNOSIS — Y999 Unspecified external cause status: Secondary | ICD-10-CM | POA: Insufficient documentation

## 2018-03-14 DIAGNOSIS — Y929 Unspecified place or not applicable: Secondary | ICD-10-CM | POA: Insufficient documentation

## 2018-03-14 DIAGNOSIS — I10 Essential (primary) hypertension: Secondary | ICD-10-CM | POA: Insufficient documentation

## 2018-03-14 DIAGNOSIS — S42202A Unspecified fracture of upper end of left humerus, initial encounter for closed fracture: Secondary | ICD-10-CM | POA: Diagnosis not present

## 2018-03-14 DIAGNOSIS — S4982XA Other specified injuries of left shoulder and upper arm, initial encounter: Secondary | ICD-10-CM | POA: Diagnosis present

## 2018-03-14 MED ORDER — MELOXICAM 15 MG PO TABS: 15.0000 mg | ORAL_TABLET | Freq: Every day | ORAL | 1 refills | Status: AC

## 2018-03-14 MED ORDER — OXYCODONE-ACETAMINOPHEN 5-325 MG PO TABS: 1.0000 | ORAL_TABLET | Freq: Three times a day (TID) | ORAL | 0 refills | Status: AC | PRN

## 2018-03-14 MED ORDER — OXYCODONE-ACETAMINOPHEN 5-325 MG PO TABS
1.0000 | ORAL_TABLET | Freq: Once | ORAL | Status: AC
  Administered 2018-03-14: 1 via ORAL
  Filled 2018-03-14: qty 1

## 2018-03-14 NOTE — ED Notes (Signed)
Patient requesting pain medication. Provider aware, wants patient to wait until xray determines injury. Patient given ice pack for comfort to left upper arm.

## 2018-03-14 NOTE — ED Notes (Signed)
Pt discharged to home.  Family member driving.  Discharge instructions reviewed.  Verbalized understanding.  No questions or concerns at this time.  Teach back verified.  Pt in NAD.  No items left in ED.   
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
## 2018-04-20 ENCOUNTER — Other Ambulatory Visit: Payer: Self-pay

## 2018-04-20 ENCOUNTER — Encounter
Admission: RE | Admit: 2018-04-20 | Discharge: 2018-04-20 | Disposition: A | Discharge status: Home / Self Care | Payer: Medicare Other | Source: Ambulatory Visit | Attending: Surgery | Admitting: Surgery

## 2018-04-20 DIAGNOSIS — I2510 Atherosclerotic heart disease of native coronary artery without angina pectoris: Secondary | ICD-10-CM | POA: Diagnosis not present

## 2018-04-20 DIAGNOSIS — I4519 Other right bundle-branch block: Secondary | ICD-10-CM | POA: Insufficient documentation

## 2018-04-20 DIAGNOSIS — Z01810 Encounter for preprocedural cardiovascular examination: Secondary | ICD-10-CM | POA: Diagnosis present

## 2018-04-20 DIAGNOSIS — Z01812 Encounter for preprocedural laboratory examination: Secondary | ICD-10-CM | POA: Diagnosis present

## 2018-04-20 HISTORY — DX: Localized edema: R60.0

## 2018-04-20 LAB — URINALYSIS, COMPLETE (UACMP) WITH MICROSCOPIC
BILIRUBIN URINE: NEGATIVE
Glucose, UA: NEGATIVE mg/dL
HGB URINE DIPSTICK: NEGATIVE
Ketones, ur: NEGATIVE mg/dL
Nitrite: NEGATIVE
PROTEIN: NEGATIVE mg/dL
SPECIFIC GRAVITY, URINE: 1.011 (ref 1.005–1.030)
pH: 6 (ref 5.0–8.0)

## 2018-04-20 LAB — CBC
HCT: 34.6 % — ABNORMAL LOW (ref 35.0–47.0)
Hemoglobin: 11.7 g/dL — ABNORMAL LOW (ref 12.0–16.0)
MCH: 30 pg (ref 26.0–34.0)
MCHC: 33.9 g/dL (ref 32.0–36.0)
MCV: 88.3 fL (ref 80.0–100.0)
PLATELETS: 291 10*3/uL (ref 150–440)
RBC: 3.92 MIL/uL (ref 3.80–5.20)
RDW: 15.5 % — AB (ref 11.5–14.5)
WBC: 10.9 10*3/uL (ref 3.6–11.0)

## 2018-04-20 LAB — BASIC METABOLIC PANEL
ANION GAP: 11 (ref 5–15)
BUN: 16 mg/dL (ref 6–20)
CALCIUM: 8.8 mg/dL — AB (ref 8.9–10.3)
CO2: 28 mmol/L (ref 22–32)
CREATININE: 0.82 mg/dL (ref 0.44–1.00)
Chloride: 100 mmol/L — ABNORMAL LOW (ref 101–111)
Glucose, Bld: 89 mg/dL (ref 65–99)
Potassium: 3.4 mmol/L — ABNORMAL LOW (ref 3.5–5.1)
Sodium: 139 mmol/L (ref 135–145)

## 2018-04-20 LAB — TYPE AND SCREEN
ABO/RH(D): A POS
Antibody Screen: NEGATIVE

## 2018-04-20 LAB — SURGICAL PCR SCREEN
MRSA, PCR: NEGATIVE
STAPHYLOCOCCUS AUREUS: NEGATIVE

## 2018-04-20 LAB — PROTIME-INR
INR: 0.87
Prothrombin Time: 11.7 seconds (ref 11.4–15.2)

## 2018-04-20 NOTE — Pre-Procedure Instructions (Addendum)
EKG shows possible anterior ischemia, as does EKG for 10/03/17, see EKG in care everywhere.

## 2018-04-20 NOTE — Patient Instructions (Signed)
Your procedure is scheduled on: 04/26/18 Thurs Report to Same Day Surgery 2nd floor medical mall [facility] Entrance-take elevator on left to 2nd floor.  Check in with surgery information desk.) To find out your arrival time please call [phone] between 1PM - 3PM on Wed 04/25/18 04/25/18 Wed Remember: Instructions that are not followed completely may result in serious medical risk, up to and including death, or upon the discretion of your surgeon and anesthesiologist your surgery may need to be rescheduled.    _x___ 1. Do not eat food after midnight the night before your procedure. You may drink clear liquids up to 2 hours before you are scheduled to arrive at the hospital for your procedure.  Do not drink clear liquids within 2 hours of your scheduled arrival to the hospital.  Clear liquids include  --Water or Apple juice without pulp  --Clear carbohydrate beverage such as ClearFast or Gatorade  --Black Coffee or Clear Tea (No milk, no creamers, do not add anything to                  the coffee or Tea Type 1 and type 2 diabetics should only drink water.  No gum chewing or hard candies.     __x__ 2. No Alcohol for 24 hours before or after surgery.   __x__3. No Smoking or e-cigarettes for 24 prior to surgery.  Do not use any chewable tobacco products for at least 6 hour prior to surgery   ____  4. Bring all medications with you on the day of surgery if instructed.    __x__ 5. Notify your doctor if there is any change in your medical condition     (cold, fever, infections).    x___6. On the morning of surgery brush your teeth with toothpaste and water.  You may rinse your mouth with mouth wash if you wish.  Do not swallow any toothpaste or mouthwash.   Do not wear jewelry, make-up, hairpins, clips or nail polish.  Do not wear lotions, powders, or perfumes. You may wear deodorant.  Do not shave 48 hours prior to surgery. Men may shave face and neck.  Do not bring valuables to the  hospital.    [facility] is not responsible for any belongings or valuables.               Contacts, dentures or bridgework may not be worn into surgery.  Leave your suitcase in the car. After surgery it may be brought to your room.  For patients admitted to the hospital, discharge time is determined by your                       treatment team.  _  Patients discharged the day of surgery will not be allowed to drive home.  You will need someone to drive you home and stay with you the night of your procedure.    Please read over the following fact sheets that you were given:   [facility] Preparing for Surgery and or MRSA Information   _x___ Take anti-hypertensive listed below, cardiac, seizure, asthma,     anti-reflux and psychiatric medicines. These include:  1. albuterol (PROVENTIL HFA;VENTOLIN HFA) 108 (90 BASE) MCG/ACT inhaler  2. atorvastatin (LIPITOR) 80 MG tablet Take      3.busPIRone (BUSPAR) 10 MG tablet  4.escitalopram (LEXAPRO) 20 MG tablet  5.hydrOXYzine (ATARAX/VISTARIL) 25 MG tablet  6.isosorbide mononitrate (IMDUR) 30 MG 24 hr tablet  7levothyroxine (  SYNTHROID, LEVOTHROID) 125 MCG tablet  8.pantoprazole (PROTONIX) 40 MG tablet  9.tiotropium (SPIRIVA) 18 MCG inhalation capsule  10oxyCODONE (OXY IR/ROXICODONE) 5 MG immediate release tablet  ____Fleets enema or Magnesium Citrate as directed.   _x___ Use CHG Soap or sage wipes as directed on instruction sheet   ____ Use inhalers on the day of surgery and bring to hospital day of surgery  ____ Stop Metformin and Janumet 2 days prior to surgery.    ____ Take 1/2 of usual insulin dose the night before surgery and none on the morning     surgery.   _x___ Follow recommendations from Cardiologist, Pulmonologist or PCP regarding          stopping Aspirin, Coumadin, Plavix ,Eliquis, Effient, or Pradaxa, and Pletal.  Stop aspirin 1 week before surgery if OK with physician.  X____Stop Anti-inflammatories such as Advil, Aleve,  Ibuprofen, Motrin, Naproxen, Naprosyn, Goodies powders or aspirin products. OK to take Tylenol and                          Celebrex.   _x___ Stop supplements until after surgery.  But may continue Vitamin D, Vitamin B,       and multivitamin.  Stop vitamin e until after surgery   ____ Bring C-Pap to the hospital.

## 2018-04-20 NOTE — Pre-Procedure Instructions (Signed)
Current Every Day Smoker  1    Smokeless Tobacco: Never Used      Alcohol Use Drinks/Week oz/Week Comments  Yes 7 Cans of beer  4.2     Sex Assigned at [employer] Date Recorded  Not on file    Job Start Date Occupation Industry  Not on file Not on file Not on file   Travel History Travel Start Travel End  No recent travel history available.     Last Filed Vital Signs - documented in this encounter  Vital Sign Reading Time Taken  Blood Pressure 124/80 01/08/2018 11:54 AM EDT  Pulse 85 01/08/2018 11:54 AM EDT  Temperature - -  Respiratory Rate - -  Oxygen Saturation 97% 01/08/2018 11:54 AM EDT  Inhaled Oxygen Concentration - -  Weight 85.3 kg (188 lb) 01/08/2018 11:54 AM EDT  Height 167.6 cm (5\' 6" ) 01/08/2018 11:54 AM EDT  Body Mass Index 30.34 01/08/2018 11:54 AM EDT   Progress Notes - documented in this encounter  Flossie Dibble, MD - 01/08/2018 11:45 AM EDT Formatting of this note might be different from the original. Established Patient Visit   Chief Complaint: Chief Complaint  Patient presents with  . Coronary Artery Disease  POC  . Hyperlipidemia  . Hypertension  . Sleep Apnea  Date of Service: 01/08/2018 Date of Birth: Apr 11, 1956 PCP: Elba Barman, MD  History of Present Illness: Ms. Maria Warren is a 62 y.o.female patient  Mixed Hyperlipidemia The patient has cardiovascular risk factors including Vascular disease, Coronary artery disease, High LDL cholesterol and greater than 7.5% 10 year cardiovascular risk score and therefore has been placed on High intensity therapy with atorvastatin (Lipitor) for reduction in LDL and cardiovascular risk for which we have discussed today. Other healthy lifestyle measures have been discussed as well. We have discussed the appropriate treatment goals of the above measures which include a 30% to 50% reduction in LDL levels. The patient has a clear understanding of reasons for lipid management at this time. The patient is  currently having no evidence of significant side effects of this medication at this time. Coronary Artery Disease The patient has coronary artery disease previously diagnosed by imaging study months ago currently on appropriate medication management including aspirin and statin for risk factor reduction. Current risk factors for progression include age, postmenopausal female, Hyperlipidemia and tobacco abuse. The patient appears not to have any significant side effects of treatment at this time and appears stable at this time without evidence of clinical progression of the disease process  Cardiac catheterization The patient has had a cardiac catheterization showing Normal LV and Mild coronary atherosclerosis. Last year without new sx Essential Hypertension The patient has a diagnosis of essential hypertension currently stage I without evidence of a secondary cause. We have discussed long and short term concerns of essential hypertension and its risk for future cardiovascular disease morbidity and mortality. We have discussed treatment options including medication management as well as lifestyle modification and diet. The patient would currently appeared not to require additional medication management and may continue working on non pharmacologic treatment. Perioperative RCRI Risk for Complications with Orthopedic Surgery 1. High risk surgery (intraperitoneal, intrathoracic, Suprainguinal vascular)-no 2. History of make heart disease (history of myocardial infarction, history of positive exercise test, current chest pain considered due to myocardial ischemia, use of nitrate therapy, EKG changes with pathological Q-waves)-yes 3. History of congestive heart failure (pulmonary edema, bilateral rales and or S3 gallop, paroxysmal nocturnal dyspnea, chest x-ray showing pulmonary vascular  distribution)-no 4. History of cerebral vascular disease (prior TIA and/or stroke)-no 5. Pre-operative treatment with  insulin-no 6. Pre-operative creatinine greater than 2 milligrams/deciliter-"no Resultant risk classification: Class II risk,0.9% risk of major cardiac event  Past Medical and Surgical History  Past Medical History Past Medical History:  Diagnosis Date  . Arthritis  . CAD (coronary artery disease)  . Chicken pox  . COPD (chronic obstructive pulmonary disease) , unspecified (CMS-HCC)  . Depression, unspecified  . Diverticulosis 11/24/2015  . Emphysema lung (CMS-HCC)  . Erosive gastritis 11/24/2015  . Hyperlipidemia, unspecified  . Hypertension  . Intestinal metaplasia of gastric mucosa 11/24/2015  . Lung disease  . Osteoporosis, post-menopausal  . Redundant colon 11/24/2015  . Sleep apnea  . Thyroid disease  . VHD (valvular heart disease)   Past Surgical History She has a past surgical history that includes Cardiac catheterization; Cesarean section; Thyroidectomy Total (2001); Colonoscopy (07/19/2011); egd (07/19/2011); Colonoscopy (12/28/2011, 08/08/2012); Colonoscopy (11/24/2015); egd (11/24/2015); and cataract surgery (Bilateral).   Medications and Allergies  Current Medications  Current Outpatient Medications on File Prior to Visit  Medication Sig Dispense Refill  . albuterol (PROAIR HFA) 90 mcg/actuation inhaler 2 inhalations every 4 (four) hours as needed.   Marland Kitchen aspirin 81 MG EC tablet Take 81 mg by mouth once daily.   Marland Kitchen atorvastatin (LIPITOR) 80 MG tablet Take 1 tablet (80 mg total) by mouth once daily 30 tablet 5  . busPIRone (BUSPAR) 10 MG tablet Take 15 mg by mouth 2 (two) times daily.   . clonazePAM (KLONOPIN) 0.5 MG tablet Take 0.5 mg by mouth 4 (four) times daily as needed for Anxiety.  . cyanocobalamin (VITAMIN B12) 1000 MCG tablet Take 1,000 mcg by mouth once daily.  Marland Kitchen escitalopram oxalate (LEXAPRO) 20 MG tablet Take 20 mg by mouth once daily.  . fluticasone-salmeterol (ADVAIR DISKUS) 250-50 mcg/dose diskus inhaler Inhale 1 inhalation into the lungs every 12  (twelve) hours.   . isosorbide mononitrate (IMDUR) 30 MG ER tablet Take 1 tablet (30 mg total) by mouth once daily. 90 tablet 3  . levothyroxine (SYNTHROID, LEVOTHROID) 175 MCG tablet 1 tablet once daily.  . nitroGLYcerin (NITROSTAT) 0.4 MG SL tablet PLACE 1 TABLET UNDER THE TONGUE EVERY 5 MINUTES AS NEEDED FOR CHEST PAIN. MAY TAKE UP TO 3 DOSES. 25 tablet 0  . omeprazole (PRILOSEC) 20 MG DR capsule Take 20 mg by mouth once daily  . polyethylene glycol (MIRALAX) packet Take 17 g by mouth continuously as needed.   . tiotropium (SPIRIVA) 18 mcg inhalation capsule Place 18 mcg into inhaler and inhale once daily.   . traMADol (ULTRAM) 50 mg tablet tramadol 50 mg tablet  . traZODone (DESYREL) 150 MG tablet Take 1 tablet by mouth nightly.   No current facility-administered medications on file prior to visit.   Allergies: Topiramate; Meloxicam; Lyrica [pregabalin]; Zoloft [sertraline]; and Bupropion hcl  Social and Family History  Social History reports that she has been smoking. She has been smoking about 1.00 pack per day. She has never used smokeless tobacco. She reports that she drinks about 4.2 oz of alcohol per week. She reports that she does not use drugs.  Family History Family History  Problem Relation Age of Onset  . Coronary Artery Disease (Blocked arteries around heart) Father  . Myocardial Infarction (Heart attack) Father  . Coronary Artery Disease (Blocked arteries around heart) Mother  . Myocardial Infarction (Heart attack) Mother  . Mental illness Mother  . Osteoporosis (Thinning of bones) Mother  . Rheum  arthritis Mother   Review of Systems   Review of Systems  Positive for knee pain Negative for weight gain weight loss, weakness, vision change, hearing loss, cough, congestion, PND, orthopnea, heartburn, nausea, diaphoresis, vomiting, diarrhea, bloody stool, melena, stomach pain, leg weakness, leg cramping, leg blood clots, headache, blackouts, nosebleed, trouble  swallowing, mouth pain, urinary frequency, urination at night, muscle weakness, skin lesions, skin rashes, tingling ,ulcers, numbness, anxiety, and/or depression Physical Examination   Vitals:BP 124/80  Pulse 85  Ht 167.6 cm (5\' 6" )  Wt 85.3 kg (188 lb)  SpO2 97%  BMI 30.34 kg/m  Ht:167.6 cm (5\' 6" ) Wt:85.3 kg (188 lb) GYK:ZLDJ surface area is 1.99 meters squared. Body mass index is 30.34 kg/m. Appearance: well appearing in no acute distress HEENT: Pupils equally reactive to light and accomodation, no xanthalasma  Neck: Supple, no apparent thyromegaly, masses, or lymphadenopathy  Lungs: normal respiratory effort; no crackles, no rhonchi, no wheezes Heart: Regular rate and rhythm. Normal S1 S2 No gallops, murmur, no rub, PMI is normal size and placement. carotid upstroke normal without bruit. Jugular venous pressure is normal Abdomen: soft, nontender, not distended with normal bowel sounds. No apparent hepatosplenomegally. Abdominal aorta is normal size without bruit Extremities: no edema, no ulcers, no clubbing, no cyanosis Peripheral Pulses: 2+ in upper extremities, 2+ femoral pulses bilaterally, 2+lower extremity  Musculoskeletal; Normal muscle tone without kyphosis Neurological: Oriented and Alert, Cranial nerves intact  Assessment   61 y.o. female with  Encounter Diagnoses  Name Primary?  . Coronary artery disease involving native coronary artery of native heart without angina pectoris Yes  . Obstructive sleep apnea syndrome  . Benign essential hypertension  . Mixed hyperlipidemia   Plan  -Proceed to surgery and/or invasive procedure without restriction to pre or post operative and/or procedural care. The patient is at lowest risk possible for cardiovascular complications with surgical intervention and/or invasive procedure. Currently has no evidence active and/or significant angina and/or congestive heart failure. The patient may discontinue aspirin 7 days prior to procedure  and restart at a safe period thereafter -Continue to use moderate to high intensive cholesterol therapy for further future risk reduction in cardiovascular disease and complication. The patient currently understands the goals, risks, and benefits of lipid treatment. We have discussed the potential side effects profile of these medications and or symptoms. They will watch for any new symptoms. -The patient will continue following closely for symptoms of cardiovascular disease including shortness of breath, chest discomfort, giving out, having palpitations, and/or significant progression in weakness and/or fatigue. They understand that these symptoms could be a clue to coronary artery disease progression and to report these symptoms as soon as possible. The patient understands to continue current medical regimen for further risk reduction and progression of symptoms and/or cardiovascular disease.  -Continue current medical regimen for hypertension control which is stable at this time and without apparent significant side effects or symptoms of medications. Further treatment goals of low sodium diet for additive effects of these medications have been discussed today as well. -We have had a long discussion about the benefits of physical and occupational rehabilitation. The patient is advised and encouraged to enroll for improvements in quality of life and reduced hospitalization.  No orders of the defined types were placed in this encounter.  Return in about 6 months (around 07/11/2018).  Flossie Dibble, MD    Electronically signed by Flossie Dibble, MD at 01/08/2018 12:14 PM EDT

## 2018-04-22 LAB — URINE CULTURE: Culture: >=100000 — AB

## 2018-04-23 NOTE — Pre-Procedure Instructions (Addendum)
E. Faecalis + Urine C&S results faxed to Dr. Nicholaus Bloom office.  This RN called Lovena Le @ Dr. Nicholaus Bloom office to request updated H&P and alert re: incoming Urine C&S fax.

## 2018-04-26 ENCOUNTER — Inpatient Hospital Stay: Payer: Medicare Other

## 2018-04-26 ENCOUNTER — Encounter
Admission: RE | Disposition: A | Discharge status: Home Health | Payer: Self-pay | Source: Ambulatory Visit | Attending: Surgery

## 2018-04-26 ENCOUNTER — Encounter: Payer: Self-pay | Admitting: *Deleted

## 2018-04-26 ENCOUNTER — Other Ambulatory Visit: Payer: Self-pay

## 2018-04-26 ENCOUNTER — Inpatient Hospital Stay
Admission: RE | Admit: 2018-04-26 | Discharge: 2018-04-27 | DRG: 483 | Disposition: A | Discharge status: Home Health | Payer: Medicare Other | Source: Ambulatory Visit | Attending: Surgery | Admitting: Surgery

## 2018-04-26 ENCOUNTER — Inpatient Hospital Stay: Payer: Medicare Other | Admitting: Certified Registered"

## 2018-04-26 DIAGNOSIS — F419 Anxiety disorder, unspecified: Secondary | ICD-10-CM | POA: Diagnosis present

## 2018-04-26 DIAGNOSIS — N390 Urinary tract infection, site not specified: Secondary | ICD-10-CM | POA: Diagnosis present

## 2018-04-26 DIAGNOSIS — S42292K Other displaced fracture of upper end of left humerus, subsequent encounter for fracture with nonunion: Principal | ICD-10-CM

## 2018-04-26 DIAGNOSIS — I10 Essential (primary) hypertension: Secondary | ICD-10-CM | POA: Diagnosis present

## 2018-04-26 DIAGNOSIS — Z79899 Other long term (current) drug therapy: Secondary | ICD-10-CM | POA: Diagnosis not present

## 2018-04-26 DIAGNOSIS — I2510 Atherosclerotic heart disease of native coronary artery without angina pectoris: Secondary | ICD-10-CM | POA: Diagnosis present

## 2018-04-26 DIAGNOSIS — F329 Major depressive disorder, single episode, unspecified: Secondary | ICD-10-CM | POA: Diagnosis present

## 2018-04-26 DIAGNOSIS — Z79890 Hormone replacement therapy (postmenopausal): Secondary | ICD-10-CM

## 2018-04-26 DIAGNOSIS — K219 Gastro-esophageal reflux disease without esophagitis: Secondary | ICD-10-CM | POA: Diagnosis present

## 2018-04-26 DIAGNOSIS — Z79891 Long term (current) use of opiate analgesic: Secondary | ICD-10-CM

## 2018-04-26 DIAGNOSIS — Z7982 Long term (current) use of aspirin: Secondary | ICD-10-CM | POA: Diagnosis not present

## 2018-04-26 DIAGNOSIS — J449 Chronic obstructive pulmonary disease, unspecified: Secondary | ICD-10-CM | POA: Diagnosis present

## 2018-04-26 DIAGNOSIS — F17210 Nicotine dependence, cigarettes, uncomplicated: Secondary | ICD-10-CM | POA: Diagnosis present

## 2018-04-26 DIAGNOSIS — Z972 Presence of dental prosthetic device (complete) (partial): Secondary | ICD-10-CM | POA: Diagnosis not present

## 2018-04-26 DIAGNOSIS — Z96612 Presence of left artificial shoulder joint: Secondary | ICD-10-CM

## 2018-04-26 DIAGNOSIS — W19XXXD Unspecified fall, subsequent encounter: Secondary | ICD-10-CM | POA: Diagnosis present

## 2018-04-26 DIAGNOSIS — E039 Hypothyroidism, unspecified: Secondary | ICD-10-CM | POA: Diagnosis present

## 2018-04-26 DIAGNOSIS — Z7951 Long term (current) use of inhaled steroids: Secondary | ICD-10-CM | POA: Diagnosis not present

## 2018-04-26 HISTORY — PX: REVERSE SHOULDER ARTHROPLASTY: SHX5054

## 2018-04-26 LAB — URINE DRUG SCREEN, QUALITATIVE (ARMC ONLY)
AMPHETAMINES, UR SCREEN: NOT DETECTED
Benzodiazepine, Ur Scrn: NOT DETECTED
COCAINE METABOLITE, UR ~~LOC~~: NOT DETECTED
Cannabinoid 50 Ng, Ur ~~LOC~~: NOT DETECTED
MDMA (ECSTASY) UR SCREEN: NOT DETECTED
METHADONE SCREEN, URINE: NOT DETECTED
Opiate, Ur Screen: NOT DETECTED
Phencyclidine (PCP) Ur S: NOT DETECTED
TRICYCLIC, UR SCREEN: POSITIVE — AB

## 2018-04-26 SURGERY — ARTHROPLASTY, SHOULDER, TOTAL, REVERSE
Anesthesia: General | Site: Shoulder | Laterality: Left | Wound class: Clean

## 2018-04-26 MED ORDER — MIDAZOLAM HCL 2 MG/2ML IJ SOLN
INTRAMUSCULAR | Status: AC
  Administered 2018-04-26: 2 mg via INTRAVENOUS
  Filled 2018-04-26: qty 2

## 2018-04-26 MED ORDER — CEFAZOLIN SODIUM-DEXTROSE 2-4 GM/100ML-% IV SOLN
INTRAVENOUS | Status: AC
  Filled 2018-04-26: qty 100

## 2018-04-26 MED ORDER — ACETAMINOPHEN 500 MG PO TABS
1000.0000 mg | ORAL_TABLET | Freq: Four times a day (QID) | ORAL | Status: DC
  Administered 2018-04-27 (×2): 1000 mg via ORAL
  Filled 2018-04-26 (×3): qty 2

## 2018-04-26 MED ORDER — ROPIVACAINE HCL 5 MG/ML IJ SOLN
INTRAMUSCULAR | Status: AC
  Filled 2018-04-26: qty 30

## 2018-04-26 MED ORDER — MAGNESIUM HYDROXIDE 400 MG/5ML PO SUSP: 30.0000 mL | Freq: Every day | ORAL | Status: DC | PRN

## 2018-04-26 MED ORDER — GLYCOPYRROLATE 0.2 MG/ML IJ SOLN
INTRAMUSCULAR | Status: AC
  Filled 2018-04-26: qty 1

## 2018-04-26 MED ORDER — VITAMIN D3 25 MCG (1000 UNIT) PO TABS
1000.0000 [IU] | ORAL_TABLET | Freq: Every day | ORAL | Status: DC
  Administered 2018-04-26 – 2018-04-27 (×2): 1000 [IU] via ORAL
  Filled 2018-04-26 (×3): qty 1

## 2018-04-26 MED ORDER — ONDANSETRON HCL 4 MG/2ML IJ SOLN: 4.0000 mg | Freq: Four times a day (QID) | INTRAMUSCULAR | Status: DC | PRN

## 2018-04-26 MED ORDER — ROCURONIUM BROMIDE 100 MG/10ML IV SOLN
INTRAVENOUS | Status: DC | PRN
  Administered 2018-04-26: 50 mg via INTRAVENOUS

## 2018-04-26 MED ORDER — DEXAMETHASONE SODIUM PHOSPHATE 10 MG/ML IJ SOLN
INTRAMUSCULAR | Status: AC
Start: 2018-04-26 — End: ?
  Filled 2018-04-26: qty 1

## 2018-04-26 MED ORDER — LEVOTHYROXINE SODIUM 25 MCG PO TABS
125.0000 ug | ORAL_TABLET | Freq: Every day | ORAL | Status: DC
  Administered 2018-04-27: 125 ug via ORAL
  Filled 2018-04-26: qty 1

## 2018-04-26 MED ORDER — DIPHENHYDRAMINE HCL 25 MG PO CAPS: 25.0000 mg | ORAL_CAPSULE | Freq: Four times a day (QID) | ORAL | Status: DC | PRN

## 2018-04-26 MED ORDER — LIDOCAINE HCL (PF) 1 % IJ SOLN
INTRAMUSCULAR | Status: AC
  Filled 2018-04-26: qty 5

## 2018-04-26 MED ORDER — NITROGLYCERIN 0.4 MG SL SUBL: 0.4000 mg | SUBLINGUAL_TABLET | SUBLINGUAL | Status: DC | PRN

## 2018-04-26 MED ORDER — KETOROLAC TROMETHAMINE 30 MG/ML IJ SOLN
INTRAMUSCULAR | Status: AC
  Filled 2018-04-26: qty 1

## 2018-04-26 MED ORDER — KETOROLAC TROMETHAMINE 30 MG/ML IJ SOLN
INTRAMUSCULAR | Status: DC | PRN
  Administered 2018-04-26: 30 mg via INTRAVENOUS

## 2018-04-26 MED ORDER — ATORVASTATIN CALCIUM 20 MG PO TABS
80.0000 mg | ORAL_TABLET | Freq: Every evening | ORAL | Status: DC
  Administered 2018-04-26: 80 mg via ORAL
  Filled 2018-04-26 (×2): qty 4

## 2018-04-26 MED ORDER — SODIUM CHLORIDE 0.9 % IV SOLN: INTRAVENOUS | Status: DC

## 2018-04-26 MED ORDER — ISOSORBIDE MONONITRATE ER 30 MG PO TB24
30.0000 mg | ORAL_TABLET | Freq: Every day | ORAL | Status: DC
  Administered 2018-04-27: 30 mg via ORAL
  Filled 2018-04-26: qty 1

## 2018-04-26 MED ORDER — KETOROLAC TROMETHAMINE 30 MG/ML IJ SOLN: 30.0000 mg | Freq: Once | INTRAMUSCULAR | Status: DC

## 2018-04-26 MED ORDER — HYDROMORPHONE HCL 1 MG/ML IJ SOLN: 0.5000 mg | INTRAMUSCULAR | Status: DC | PRN

## 2018-04-26 MED ORDER — LIDOCAINE HCL (PF) 2 % IJ SOLN
INTRAMUSCULAR | Status: AC
  Filled 2018-04-26: qty 10

## 2018-04-26 MED ORDER — BUSPIRONE HCL 10 MG PO TABS
10.0000 mg | ORAL_TABLET | Freq: Three times a day (TID) | ORAL | Status: DC
  Administered 2018-04-26 – 2018-04-27 (×3): 10 mg via ORAL
  Filled 2018-04-26 (×3): qty 1

## 2018-04-26 MED ORDER — SUGAMMADEX SODIUM 200 MG/2ML IV SOLN
INTRAVENOUS | Status: AC
  Filled 2018-04-26: qty 2

## 2018-04-26 MED ORDER — CEFAZOLIN SODIUM-DEXTROSE 2-4 GM/100ML-% IV SOLN
2.0000 g | Freq: Four times a day (QID) | INTRAVENOUS | Status: AC
  Administered 2018-04-26 – 2018-04-27 (×3): 2 g via INTRAVENOUS
  Filled 2018-04-26 (×3): qty 100

## 2018-04-26 MED ORDER — B COMPLEX-C PO TABS
1.0000 | ORAL_TABLET | Freq: Every day | ORAL | Status: DC
  Administered 2018-04-27: 1 via ORAL
  Filled 2018-04-26 (×2): qty 1

## 2018-04-26 MED ORDER — TIOTROPIUM BROMIDE MONOHYDRATE 18 MCG IN CAPS
18.0000 ug | ORAL_CAPSULE | Freq: Every day | RESPIRATORY_TRACT | Status: DC
  Administered 2018-04-26 – 2018-04-27 (×2): 18 ug via RESPIRATORY_TRACT
  Filled 2018-04-26: qty 5

## 2018-04-26 MED ORDER — ACETAMINOPHEN 10 MG/ML IV SOLN
INTRAVENOUS | Status: DC | PRN
  Administered 2018-04-26: 1000 mg via INTRAVENOUS

## 2018-04-26 MED ORDER — VITAMIN B-12 1000 MCG PO TABS
1000.0000 ug | ORAL_TABLET | Freq: Every day | ORAL | Status: DC
  Administered 2018-04-27: 1000 ug via ORAL
  Filled 2018-04-26: qty 10
  Filled 2018-04-26: qty 1

## 2018-04-26 MED ORDER — LACTATED RINGERS IV SOLN
INTRAVENOUS | Status: DC
  Administered 2018-04-26 (×2): via INTRAVENOUS

## 2018-04-26 MED ORDER — MIDAZOLAM HCL 2 MG/2ML IJ SOLN
INTRAMUSCULAR | Status: AC
  Filled 2018-04-26: qty 4

## 2018-04-26 MED ORDER — ONDANSETRON HCL 4 MG/2ML IJ SOLN
INTRAMUSCULAR | Status: AC
  Filled 2018-04-26: qty 2

## 2018-04-26 MED ORDER — DOCUSATE SODIUM 100 MG PO CAPS
100.0000 mg | ORAL_CAPSULE | Freq: Two times a day (BID) | ORAL | Status: DC
  Administered 2018-04-26 – 2018-04-27 (×2): 100 mg via ORAL
  Filled 2018-04-26 (×2): qty 1

## 2018-04-26 MED ORDER — FENTANYL CITRATE (PF) 100 MCG/2ML IJ SOLN
INTRAMUSCULAR | Status: AC
  Filled 2018-04-26: qty 2

## 2018-04-26 MED ORDER — TRAMADOL HCL 50 MG PO TABS: 50.0000 mg | ORAL_TABLET | Freq: Four times a day (QID) | ORAL | Status: DC | PRN

## 2018-04-26 MED ORDER — KETOROLAC TROMETHAMINE 15 MG/ML IJ SOLN
15.0000 mg | Freq: Four times a day (QID) | INTRAMUSCULAR | Status: DC
  Administered 2018-04-26 – 2018-04-27 (×3): 15 mg via INTRAVENOUS
  Filled 2018-04-26 (×3): qty 1

## 2018-04-26 MED ORDER — FENTANYL CITRATE (PF) 100 MCG/2ML IJ SOLN
INTRAMUSCULAR | Status: AC
  Administered 2018-04-26: 50 ug via INTRAVENOUS
  Filled 2018-04-26: qty 2

## 2018-04-26 MED ORDER — BUPIVACAINE-EPINEPHRINE (PF) 0.5% -1:200000 IJ SOLN
INTRAMUSCULAR | Status: DC | PRN
  Administered 2018-04-26: 30 mL via PERINEURAL

## 2018-04-26 MED ORDER — CEFAZOLIN SODIUM-DEXTROSE 2-4 GM/100ML-% IV SOLN
2.0000 g | Freq: Once | INTRAVENOUS | Status: AC
  Administered 2018-04-26: 2 g via INTRAVENOUS

## 2018-04-26 MED ORDER — ROPIVACAINE HCL 5 MG/ML IJ SOLN
INTRAMUSCULAR | Status: DC | PRN
  Administered 2018-04-26: 30 mL via PERINEURAL

## 2018-04-26 MED ORDER — HYDROXYZINE HCL 25 MG PO TABS
25.0000 mg | ORAL_TABLET | Freq: Three times a day (TID) | ORAL | Status: DC | PRN
  Administered 2018-04-26: 25 mg via ORAL
  Filled 2018-04-26 (×2): qty 1

## 2018-04-26 MED ORDER — SODIUM CHLORIDE 0.9 % IV SOLN
INTRAVENOUS | Status: DC | PRN
  Administered 2018-04-26: 25 ug/min via INTRAVENOUS

## 2018-04-26 MED ORDER — PROPOFOL 10 MG/ML IV BOLUS
INTRAVENOUS | Status: DC | PRN
  Administered 2018-04-26: 150 mg via INTRAVENOUS
  Administered 2018-04-26: 50 mg via INTRAVENOUS

## 2018-04-26 MED ORDER — FENTANYL CITRATE (PF) 100 MCG/2ML IJ SOLN
50.0000 ug | Freq: Once | INTRAMUSCULAR | Status: AC
  Administered 2018-04-26: 50 ug via INTRAVENOUS

## 2018-04-26 MED ORDER — SODIUM CHLORIDE FLUSH 0.9 % IV SOLN
INTRAVENOUS | Status: AC
  Filled 2018-04-26: qty 40

## 2018-04-26 MED ORDER — LIDOCAINE HCL (PF) 1 % IJ SOLN
INTRAMUSCULAR | Status: DC | PRN
  Administered 2018-04-26: .8 mL via SUBCUTANEOUS

## 2018-04-26 MED ORDER — TRAZODONE HCL 50 MG PO TABS
50.0000 mg | ORAL_TABLET | Freq: Every day | ORAL | Status: DC
  Administered 2018-04-26: 50 mg via ORAL
  Filled 2018-04-26: qty 1

## 2018-04-26 MED ORDER — ONDANSETRON HCL 4 MG PO TABS: 4.0000 mg | ORAL_TABLET | Freq: Four times a day (QID) | ORAL | Status: DC | PRN

## 2018-04-26 MED ORDER — SUGAMMADEX SODIUM 200 MG/2ML IV SOLN
INTRAVENOUS | Status: DC | PRN
  Administered 2018-04-26: 200 mg via INTRAVENOUS

## 2018-04-26 MED ORDER — EPINEPHRINE PF 1 MG/ML IJ SOLN
INTRAMUSCULAR | Status: AC
  Filled 2018-04-26: qty 1

## 2018-04-26 MED ORDER — LIDOCAINE HCL (CARDIAC) PF 100 MG/5ML IV SOSY
PREFILLED_SYRINGE | INTRAVENOUS | Status: DC | PRN
  Administered 2018-04-26: 50 mg via INTRAVENOUS

## 2018-04-26 MED ORDER — VITAMIN E 45 MG (100 UNIT) PO CAPS
200.0000 [IU] | ORAL_CAPSULE | Freq: Every day | ORAL | Status: DC
  Administered 2018-04-27: 200 [IU] via ORAL
  Filled 2018-04-26 (×2): qty 2

## 2018-04-26 MED ORDER — METOCLOPRAMIDE HCL 5 MG/ML IJ SOLN: 5.0000 mg | Freq: Three times a day (TID) | INTRAMUSCULAR | Status: DC | PRN

## 2018-04-26 MED ORDER — ONDANSETRON HCL 4 MG/2ML IJ SOLN: 4.0000 mg | Freq: Once | INTRAMUSCULAR | Status: DC | PRN

## 2018-04-26 MED ORDER — IPRATROPIUM-ALBUTEROL 0.5-2.5 (3) MG/3ML IN SOLN
RESPIRATORY_TRACT | Status: AC
  Filled 2018-04-26: qty 3

## 2018-04-26 MED ORDER — ESCITALOPRAM OXALATE 10 MG PO TABS
20.0000 mg | ORAL_TABLET | Freq: Every day | ORAL | Status: DC
  Administered 2018-04-27: 20 mg via ORAL
  Filled 2018-04-26 (×2): qty 2

## 2018-04-26 MED ORDER — FENTANYL CITRATE (PF) 100 MCG/2ML IJ SOLN
INTRAMUSCULAR | Status: DC | PRN
  Administered 2018-04-26 (×2): 100 ug via INTRAVENOUS

## 2018-04-26 MED ORDER — ALBUTEROL SULFATE (2.5 MG/3ML) 0.083% IN NEBU: 3.0000 mL | INHALATION_SOLUTION | RESPIRATORY_TRACT | Status: DC | PRN

## 2018-04-26 MED ORDER — BUPIVACAINE LIPOSOME 1.3 % IJ SUSP
INTRAMUSCULAR | Status: AC
  Filled 2018-04-26: qty 20

## 2018-04-26 MED ORDER — FENTANYL CITRATE (PF) 100 MCG/2ML IJ SOLN: 25.0000 ug | INTRAMUSCULAR | Status: DC | PRN

## 2018-04-26 MED ORDER — MOMETASONE FURO-FORMOTEROL FUM 200-5 MCG/ACT IN AERO
2.0000 | INHALATION_SPRAY | Freq: Two times a day (BID) | RESPIRATORY_TRACT | Status: DC
  Administered 2018-04-26 – 2018-04-27 (×2): 2 via RESPIRATORY_TRACT
  Filled 2018-04-26: qty 8.8

## 2018-04-26 MED ORDER — ARTIFICIAL TEARS OPHTHALMIC OINT
TOPICAL_OINTMENT | OPHTHALMIC | Status: AC
  Filled 2018-04-26: qty 3.5

## 2018-04-26 MED ORDER — ENOXAPARIN SODIUM 40 MG/0.4ML ~~LOC~~ SOLN
40.0000 mg | SUBCUTANEOUS | Status: DC
  Administered 2018-04-27: 40 mg via SUBCUTANEOUS
  Filled 2018-04-26: qty 0.4

## 2018-04-26 MED ORDER — PROPOFOL 10 MG/ML IV BOLUS
INTRAVENOUS | Status: AC
  Filled 2018-04-26: qty 20

## 2018-04-26 MED ORDER — FLEET ENEMA 7-19 GM/118ML RE ENEM: 1.0000 | ENEMA | Freq: Once | RECTAL | Status: DC | PRN

## 2018-04-26 MED ORDER — DIPHENHYDRAMINE HCL 12.5 MG/5ML PO ELIX: 12.5000 mg | ORAL_SOLUTION | ORAL | Status: DC | PRN

## 2018-04-26 MED ORDER — DEXAMETHASONE SODIUM PHOSPHATE 10 MG/ML IJ SOLN
INTRAMUSCULAR | Status: DC | PRN
  Administered 2018-04-26: 10 mg via INTRAVENOUS

## 2018-04-26 MED ORDER — SODIUM CHLORIDE 0.9 % IV SOLN
INTRAVENOUS | Status: DC | PRN
  Administered 2018-04-26: 60 mL

## 2018-04-26 MED ORDER — POLYETHYLENE GLYCOL 3350 17 G PO PACK: 17.0000 g | PACK | Freq: Every day | ORAL | Status: DC | PRN

## 2018-04-26 MED ORDER — PHENYLEPHRINE HCL 10 MG/ML IJ SOLN
INTRAMUSCULAR | Status: DC | PRN
  Administered 2018-04-26 (×2): 50 ug via INTRAVENOUS

## 2018-04-26 MED ORDER — MIDAZOLAM HCL 2 MG/2ML IJ SOLN
INTRAMUSCULAR | Status: DC | PRN
  Administered 2018-04-26 (×2): 2 mg via INTRAVENOUS

## 2018-04-26 MED ORDER — GLYCOPYRROLATE 0.2 MG/ML IJ SOLN
INTRAMUSCULAR | Status: DC | PRN
  Administered 2018-04-26: 0.1 mg via INTRAVENOUS

## 2018-04-26 MED ORDER — TRANEXAMIC ACID 1000 MG/10ML IV SOLN
INTRAVENOUS | Status: AC
  Filled 2018-04-26: qty 10

## 2018-04-26 MED ORDER — ACETAMINOPHEN 325 MG PO TABS: 325.0000 mg | ORAL_TABLET | Freq: Four times a day (QID) | ORAL | Status: DC | PRN

## 2018-04-26 MED ORDER — NEOMYCIN-POLYMYXIN B GU 40-200000 IR SOLN
Status: DC | PRN
  Administered 2018-04-26: 14 mL

## 2018-04-26 MED ORDER — BISACODYL 10 MG RE SUPP: 10.0000 mg | Freq: Every day | RECTAL | Status: DC | PRN

## 2018-04-26 MED ORDER — BUPIVACAINE-EPINEPHRINE (PF) 0.5% -1:200000 IJ SOLN
INTRAMUSCULAR | Status: AC
  Filled 2018-04-26: qty 30

## 2018-04-26 MED ORDER — PANTOPRAZOLE SODIUM 40 MG PO TBEC: 40.0000 mg | DELAYED_RELEASE_TABLET | Freq: Every day | ORAL | Status: DC

## 2018-04-26 MED ORDER — CALCITRIOL 0.25 MCG PO CAPS
0.5000 ug | ORAL_CAPSULE | Freq: Two times a day (BID) | ORAL | Status: DC
  Administered 2018-04-26 – 2018-04-27 (×2): 0.5 ug via ORAL
  Filled 2018-04-26 (×4): qty 2

## 2018-04-26 MED ORDER — ONDANSETRON HCL 4 MG/2ML IJ SOLN
INTRAMUSCULAR | Status: DC | PRN
  Administered 2018-04-26 (×2): 4 mg via INTRAVENOUS

## 2018-04-26 MED ORDER — MIDAZOLAM HCL 2 MG/2ML IJ SOLN
2.0000 mg | Freq: Once | INTRAMUSCULAR | Status: AC
  Administered 2018-04-26: 2 mg via INTRAVENOUS

## 2018-04-26 MED ORDER — ROCURONIUM BROMIDE 50 MG/5ML IV SOLN
INTRAVENOUS | Status: AC
  Filled 2018-04-26: qty 1

## 2018-04-26 MED ORDER — TRANEXAMIC ACID 1000 MG/10ML IV SOLN
INTRAVENOUS | Status: DC | PRN
  Administered 2018-04-26: 1000 mg via INTRAVENOUS

## 2018-04-26 MED ORDER — METOCLOPRAMIDE HCL 10 MG PO TABS: 5.0000 mg | ORAL_TABLET | Freq: Three times a day (TID) | ORAL | Status: DC | PRN

## 2018-04-26 MED ORDER — PHENYLEPHRINE HCL 10 MG/ML IJ SOLN
INTRAMUSCULAR | Status: AC
  Filled 2018-04-26: qty 1

## 2018-04-26 MED ORDER — ASPIRIN EC 81 MG PO TBEC
81.0000 mg | DELAYED_RELEASE_TABLET | Freq: Every day | ORAL | Status: DC
  Administered 2018-04-27: 81 mg via ORAL
  Filled 2018-04-26: qty 1

## 2018-04-26 MED ORDER — KETAMINE HCL 10 MG/ML IJ SOLN
INTRAMUSCULAR | Status: DC | PRN
  Administered 2018-04-26 (×2): 25 mg via INTRAVENOUS

## 2018-04-26 MED ORDER — ACETAMINOPHEN 10 MG/ML IV SOLN
INTRAVENOUS | Status: AC
  Filled 2018-04-26: qty 100

## 2018-04-26 MED ORDER — OXYCODONE HCL 5 MG PO TABS
5.0000 mg | ORAL_TABLET | ORAL | Status: DC | PRN
  Administered 2018-04-26: 5 mg via ORAL
  Administered 2018-04-27: 10 mg via ORAL
  Filled 2018-04-26: qty 2
  Filled 2018-04-26: qty 1

## 2018-04-26 SURGICAL SUPPLY — 66 items
BIT DRILL 2.5 (BIT) ×1
BIT DRILL 2.5X4.5XSCR (BIT) ×1 IMPLANT
BIT DRL 2.5X4.5XSCR (BIT) ×1
BLADE SAGITTAL WIDE XTHICK NO (BLADE) ×3 IMPLANT
CANISTER SUCT 1200ML W/VALVE (MISCELLANEOUS) ×3 IMPLANT
CANISTER SUCT 3000ML PPV (MISCELLANEOUS) ×6 IMPLANT
CHLORAPREP W/TINT 26ML (MISCELLANEOUS) ×3 IMPLANT
COOLER POLAR GLACIER W/PUMP (MISCELLANEOUS) ×3 IMPLANT
CRADLE LAMINECT ARM (MISCELLANEOUS) ×3 IMPLANT
DRAPE IMP U-DRAPE 54X76 (DRAPES) ×6 IMPLANT
DRAPE INCISE IOBAN 66X45 STRL (DRAPES) ×6 IMPLANT
DRAPE INCISE IOBAN 66X60 STRL (DRAPES) ×3 IMPLANT
DRAPE SHEET LG 3/4 BI-LAMINATE (DRAPES) ×6 IMPLANT
DRAPE TABLE BACK 80X90 (DRAPES) ×3 IMPLANT
DRILL 2.5MM (BIT) ×1
DRSG OPSITE POSTOP 4X8 (GAUZE/BANDAGES/DRESSINGS) ×3 IMPLANT
ELECT BLADE 6.5 EXT (BLADE) ×3 IMPLANT
ELECT CAUTERY BLADE 6.4 (BLADE) ×3 IMPLANT
GLENOSPHERE RSS 2 CONCENTRIC (Shoulder) ×3 IMPLANT
GLOVE BIO SURGEON STRL SZ7.5 (GLOVE) ×12 IMPLANT
GLOVE BIO SURGEON STRL SZ8 (GLOVE) ×12 IMPLANT
GLOVE BIOGEL PI IND STRL 8 (GLOVE) ×1 IMPLANT
GLOVE BIOGEL PI INDICATOR 8 (GLOVE) ×2
GLOVE INDICATOR 8.0 STRL GRN (GLOVE) ×3 IMPLANT
GOWN STRL REUS W/ TWL LRG LVL3 (GOWN DISPOSABLE) ×1 IMPLANT
GOWN STRL REUS W/ TWL XL LVL3 (GOWN DISPOSABLE) ×1 IMPLANT
GOWN STRL REUS W/TWL LRG LVL3 (GOWN DISPOSABLE) ×2
GOWN STRL REUS W/TWL XL LVL3 (GOWN DISPOSABLE) ×2
GUIDE PIN 2.0 S150MM (PIN) ×3 IMPLANT
HOOD PEEL AWAY FLYTE STAYCOOL (MISCELLANEOUS) ×9 IMPLANT
KIT STABILIZATION SHOULDER (MISCELLANEOUS) ×3 IMPLANT
KIT TURNOVER KIT A (KITS) ×3 IMPLANT
LINER SHOULDER STD 3 (Liner) ×3 IMPLANT
MASK FACE SPIDER DISP (MASK) ×3 IMPLANT
NDL SAFETY ECLIPSE 18X1.5 (NEEDLE) ×1 IMPLANT
NEEDLE HYPO 18GX1.5 SHARP (NEEDLE) ×2
NEEDLE HYPO 22GX1.5 SAFETY (NEEDLE) ×3 IMPLANT
NEEDLE MAYO CATGUT SZ 1.5 (NEEDLE) ×3
NEEDLE MAYO CATGUT SZ 2 (NEEDLE) ×1 IMPLANT
NEEDLE SPNL 20GX3.5 QUINCKE YW (NEEDLE) ×3 IMPLANT
NS IRRIG 500ML POUR BTL (IV SOLUTION) ×3 IMPLANT
PACK ARTHROSCOPY SHOULDER (MISCELLANEOUS) ×3 IMPLANT
PAD WRAPON POLAR SHDR UNIV (MISCELLANEOUS) ×1 IMPLANT
PLATE BASE REVERSE RSS S (Plate) ×3 IMPLANT
PULSAVAC PLUS IRRIG FAN TIP (DISPOSABLE) ×3
SCREW 4.5X15 RSS W CAP (Screw) ×6 IMPLANT
SCREW 4.5X20 RSS W CAP (Screw) ×6 IMPLANT
SCREW 4.5X25 RSS W CAP (Screw) ×3 IMPLANT
SCREW BODY REVERSE SMALL TITAN (Screw) ×3 IMPLANT
SLING ULTRA II M (MISCELLANEOUS) ×3 IMPLANT
SOL .9 NS 3000ML IRR  AL (IV SOLUTION) ×2
SOL .9 NS 3000ML IRR UROMATIC (IV SOLUTION) ×1 IMPLANT
SPONGE LAP 18X18 RF (DISPOSABLE) ×3 IMPLANT
STAPLER SKIN PROX 35W (STAPLE) ×3 IMPLANT
STEM PRESS FIT SZ 09 TSS (Stem) ×3 IMPLANT
SUT ETHIBOND 0 MO6 C/R (SUTURE) ×3 IMPLANT
SUT FIBERWIRE #2 38 BLUE 1/2 (SUTURE) ×12
SUT VIC AB 0 CT1 36 (SUTURE) ×6 IMPLANT
SUT VIC AB 2-0 CT1 27 (SUTURE) ×4
SUT VIC AB 2-0 CT1 TAPERPNT 27 (SUTURE) ×2 IMPLANT
SUTURE FIBERWR #2 38 BLUE 1/2 (SUTURE) ×4 IMPLANT
SYR 10ML LL (SYRINGE) ×3 IMPLANT
SYR 30ML LL (SYRINGE) ×6 IMPLANT
TIP FAN IRRIG PULSAVAC PLUS (DISPOSABLE) ×1 IMPLANT
TRAY FOLEY MTR SLVR 16FR STAT (SET/KITS/TRAYS/PACK) ×3 IMPLANT
WRAPON POLAR PAD SHDR UNIV (MISCELLANEOUS) ×3

## 2018-04-26 NOTE — Anesthesia Procedure Notes (Signed)
Procedure Name: Intubation Date/Time: 04/26/2018 10:38 AM Performed by: Nile Riggs, CRNA Pre-anesthesia Checklist: Patient identified, Emergency Drugs available, Suction available, Patient being monitored and Timeout performed Patient Re-evaluated:Patient Re-evaluated prior to induction Oxygen Delivery Method: Circle system utilized Preoxygenation: Pre-oxygenation with 100% oxygen Induction Type: IV induction Ventilation: Mask ventilation without difficulty Laryngoscope Size: Miller and 2 Grade View: Grade I Tube type: Oral Tube size: 7.5 mm Number of attempts: 1 Airway Equipment and Method: Stylet Placement Confirmation: ETT inserted through vocal cords under direct vision,  positive ETCO2,  CO2 detector and breath sounds checked- equal and bilateral Secured at: 20 cm Tube secured with: Tape Dental Injury: Teeth and Oropharynx as per pre-operative assessment

## 2018-04-26 NOTE — Transfer of Care (Signed)
Immediate Anesthesia Transfer of Care Note  Patient: Maria Warren  Procedure(s) Performed: REVERSE SHOULDER ARTHROPLASTY (Left Shoulder)  Patient Location: PACU  Anesthesia Type:General  Level of Consciousness: awake, alert , oriented and patient cooperative  Airway & Oxygen Therapy: Patient Spontanous Breathing and Patient connected to face mask oxygen  Post-op Assessment: Report given to RN, Post -op Vital signs reviewed and stable and Patient moving all extremities  Post vital signs: Reviewed and stable  Last Vitals:  Vitals Value Taken Time  BP 151/89 04/26/2018  1:16 PM  Temp 37.1 C 04/26/2018  1:16 PM  Pulse 111 04/26/2018  1:18 PM  Resp 18 04/26/2018  1:18 PM  SpO2 100 % 04/26/2018  1:18 PM  Vitals shown include unvalidated device data.  Last Pain:  Vitals:   04/26/18 1316  TempSrc: Temporal  PainSc: 0-No pain         Complications: No apparent anesthesia complications

## 2018-04-26 NOTE — Anesthesia Procedure Notes (Signed)
Anesthesia Regional Block: Interscalene brachial plexus block   Pre-Anesthetic Checklist: ,, timeout performed, Correct Patient, Correct Site, Correct Laterality, Correct Procedure, Correct Position, site marked, Risks and benefits discussed,  Surgical consent,  Pre-op evaluation,  At surgeon's request and post-op pain management  Laterality: Left  Prep: chloraprep       Needles:  Injection technique: Single-shot  Needle Type: Echogenic Stimulator Needle     Needle Length: 9cm  Needle Gauge: 22     Additional Needles:   Procedures:, nerve stimulator,,, ultrasound used (permanent image in chart),,,,   Nerve Stimulator or Paresthesia:  Response: biceps flexion, 0.8 mA,   Additional Responses:   Narrative:  Start time: 04/26/2018 10:18 AM End time: 04/26/2018 10:22 AM Injection made incrementally with aspirations every 5 mL.  Performed by: Personally   Additional Notes: Functioning IV was confirmed and monitors were applied.  A 107mm 22ga Stimuplex needle was used. Sterile prep and drape,hand hygiene and sterile gloves were used.  Negative aspiration and negative test dose prior to incremental administration of local anesthetic. The patient tolerated the procedure well.

## 2018-04-26 NOTE — Anesthesia Preprocedure Evaluation (Signed)
Anesthesia Evaluation  Patient identified by MRN, date of birth, ID band Patient awake    Reviewed: Allergy & Precautions, NPO status , Patient's Chart, lab work & pertinent test results  History of Anesthesia Complications Negative for: history of anesthetic complications  Airway Mallampati: III       Dental  (+) Upper Dentures, Edentulous Lower   Pulmonary shortness of breath, neg sleep apnea, COPD,  COPD inhaler, Current Smoker,           Cardiovascular hypertension, Pt. on medications (-) Past MI and (-) CHF (-) dysrhythmias (-) Valvular Problems/Murmurs     Neuro/Psych neg Seizures Anxiety Depression    GI/Hepatic Neg liver ROS, GERD  Medicated and Controlled,  Endo/Other  neg diabetesHypothyroidism   Renal/GU negative Renal ROS     Musculoskeletal   Abdominal   Peds  Hematology   Anesthesia Other Findings   Reproductive/Obstetrics                             Anesthesia Physical Anesthesia Plan  ASA: III  Anesthesia Plan: General   Post-op Pain Management:    Induction: Intravenous  PONV Risk Score and Plan: 2  Airway Management Planned: Oral ETT  Additional Equipment:   Intra-op Plan:   Post-operative Plan:   Informed Consent: I have reviewed the patients History and Physical, chart, labs and discussed the procedure including the risks, benefits and alternatives for the proposed anesthesia with the patient or authorized representative who has indicated his/her understanding and acceptance.     Plan Discussed with:   Anesthesia Plan Comments:         Anesthesia Quick Evaluation

## 2018-04-26 NOTE — H&P (Signed)
Paper H&P to be scanned into permanent record. H&P reviewed and patient re-examined. No changes. 

## 2018-04-26 NOTE — Evaluation (Signed)
Physical Therapy Evaluation Patient Details Name: Maria Warren MRN: [id number] DOB: 10-14-1956 Today's Date: 04/26/2018   History of Present Illness  pt presents to hospital on 04/26/18 for a L reverse total shoulder replacement performed by Dr. Roland Rack. Pt has a past medical history that includes COPD, GERD, anxiety and depression, as well as a L TKR on 01/29/18 which pt states she is 100% recovered from.    Clinical Impression  Pt is a pleasant 62 year old female who was admitted for a L reverse total shoulder replacement performed by Dr Roland Rack. Pt performs bed mobility, transfers, and ambulation with CGA. Pt demonstrates deficits with strength, ROM, and mobility. Pt sensation WNL on B UE. Pt states that pain was 0/10 at the onset of therapy and remained controlled throughout. Pt was transferred with CGA sit>stand and amb 15' in room using IV pole, pt transferred stand>sit in chair at bedside. PT deferred there-ex this visit will attempt there-ex, gait training and stair mobility next date. Pt tolerated treatment well. PT would benefit from continued skilled therapy to improve toward PLOF. PT will continue to work with pt BID during admission. At this time PT recommends d/c home with outpatient PT.    Follow Up Recommendations Outpatient PT    Equipment Recommendations  None recommended by PT    Recommendations for Other Services       Precautions / Restrictions Precautions Precautions: Shoulder Shoulder Interventions: Shoulder sling/immobilizer;At all times Restrictions Weight Bearing Restrictions: Yes LUE Weight Bearing: Non weight bearing      Mobility  Bed Mobility Overal bed mobility: Needs Assistance Bed Mobility: Supine to Sit     Supine to sit: Min guard     General bed mobility comments: Min guard of L UE while pt performs bed mobility to prevent breaking precautions  Transfers Overall transfer level: Needs assistance Equipment used: None Transfers: Sit to/from  Stand Sit to Stand: Min guard         General transfer comment: pt CGA sit to stand. Pt performs transfer without LOB or change in symptoms.  Ambulation/Gait Ambulation/Gait assistance: Min guard Gait Distance (Feet): 15 Feet Assistive device: IV Pole Gait Pattern/deviations: Step-through pattern;WFL(Within Functional Limits)     General Gait Details: pt amb 15 in room using IV pole for additional stability. Pt does not experience LOB or change in symptoms during gait  Stairs            Wheelchair Mobility    Modified Rankin (Stroke Patients Only)       Balance Overall balance assessment: Needs assistance   Sitting balance-Leahy Scale: Good Sitting balance - Comments: Sits unsupported for long periods of time without gross LOB     Standing balance-Leahy Scale: Good Standing balance comment: Stands without UE support without change in symptoms or LOB.                             Pertinent Vitals/Pain Pain Assessment: No/denies pain    Home Living Family/patient expects to be discharged to:: Private residence Living Arrangements: Alone Available Help at Discharge: Friend(s)(Rodney) Type of Home: House Home Access: Stairs to enter Entrance Stairs-Rails: Chemical engineer of Steps: 2 Home Layout: One level Home Equipment: None      Prior Function Level of Independence: Independent         Comments: pt independent in community. Denies previous falls     Hand Dominance  Extremity/Trunk Assessment   Upper Extremity Assessment Upper Extremity Assessment: RUE deficits/detail;LUE deficits/detail RUE Deficits / Details: Grossly at least 4/5 RUE Sensation: WNL LUE Deficits / Details: Not assessed due to restrictions LUE Sensation: WNL    Lower Extremity Assessment Lower Extremity Assessment: Overall WFL for tasks assessed(Grossly at least 4/5)       Communication   Communication: No difficulties  Cognition  Arousal/Alertness: Awake/alert Behavior During Therapy: WFL for tasks assessed/performed Overall Cognitive Status: Within Functional Limits for tasks assessed                                        General Comments      Exercises     Assessment/Plan    PT Assessment Patient needs continued PT services  PT Problem List Decreased range of motion;Decreased strength;Decreased activity tolerance;Decreased mobility;Decreased safety awareness;Decreased knowledge of use of DME       PT Treatment Interventions DME instruction;Gait training;Stair training;Functional mobility training;Therapeutic activities;Therapeutic exercise;Balance training    PT Goals (Current goals can be found in the Care Plan section)  Acute Rehab PT Goals Patient Stated Goal: to get better and be pain free PT Goal Formulation: With patient Time For Goal Achievement: 05/10/18 Potential to Achieve Goals: Good    Frequency BID   Barriers to discharge        Co-evaluation               AM-PAC PT "6 Clicks" Daily Activity  Outcome Measure Difficulty turning over in bed (including adjusting bedclothes, sheets and blankets)?: Unable Difficulty moving from lying on back to sitting on the side of the bed? : Unable Difficulty sitting down on and standing up from a chair with arms (e.g., wheelchair, bedside commode, etc,.)?: Unable Help needed moving to and from a bed to chair (including a wheelchair)?: A Little Help needed walking in hospital room?: A Little Help needed climbing 3-5 steps with a railing? : A Little 6 Click Score: 12    End of Session Equipment Utilized During Treatment: Gait belt Activity Tolerance: Patient tolerated treatment well;No increased pain Patient left: in chair;with call bell/phone within reach;with chair alarm set;with SCD's reapplied;with family/visitor present Nurse Communication: Mobility status PT Visit Diagnosis: Pain;Unsteadiness on feet (R26.81) Pain -  Right/Left: Left Pain - part of body: Shoulder    Time: 9604-5409 PT Time Calculation (min) (ACUTE ONLY): 32 min   Charges:         PT G Codes:        Maria Warren, SPT   Maria Warren 04/26/2018, 5:11 PM

## 2018-04-26 NOTE — Anesthesia Post-op Follow-up Note (Signed)
Anesthesia QCDR form completed.        

## 2018-04-26 NOTE — Anesthesia Postprocedure Evaluation (Signed)
Anesthesia Post Note  Patient: Maria Warren  Procedure(s) Performed: REVERSE SHOULDER ARTHROPLASTY (Left Shoulder)  Patient location during evaluation: PACU Anesthesia Type: General Level of consciousness: awake and alert Pain management: pain level controlled Vital Signs Assessment: post-procedure vital signs reviewed and stable Respiratory status: spontaneous breathing and respiratory function stable Cardiovascular status: stable Anesthetic complications: no     Last Vitals:  Vitals:   04/26/18 1333 04/26/18 1346  BP: 136/74 123/74  Pulse: (!) 106 99  Resp: 17 15  Temp:  36.9 C  SpO2: 100% 93%    Last Pain:  Vitals:   04/26/18 1346  TempSrc:   PainSc: 0-No pain                 KEPHART,WILLIAM K

## 2018-04-26 NOTE — Op Note (Signed)
04/26/2018  1:10 PM  Patient:   Maria Warren  Pre-Op Diagnosis:   Nonunion of proximal humerus fracture, left shoulder.  Post-Op Diagnosis:   Same.  Procedure:   Reverse left total shoulder arthroplasty.  Surgeon:   Pascal Lux, MD  Assistant:   Cameron Proud, PA-C  Anesthesia:   General endotracheal with an interscalene block placed preoperatively by the anesthesiologist.  Findings:   As above.  Complications:   None  EBL:   250 cc  Fluids:   1000 cc crystalloid  UOP:   None  TT:   None  Drains:   None  Closure:   Staples  Implants:   All press-fit Integra system with a 9 mm stem, a small metaphyseal body, a +3 mm humeral platform, a mini baseplate, and a 38 mm concentric 2 mm laterally offset glenosphere.  Brief Clinical Note:   The patient is a 62 year old female who sustained a 2 part left proximal humerus fracture following a fall approximately 6 weeks ago. An initial attempt was made to treat this nonsurgically, but after 1 month, the fracture showed no signs of healing and she was having significant pain. Therefore, the patient presents at this time for a reverse left total shoulder arthroplasty.  Procedure:   The patient underwent placement of an interscalene block by the anesthesiologist in the preoperative holding area before being brought into the operating room and lain in the supine position. The patient then underwent general endotracheal intubation and anesthesia before the patient was repositioned in the beach chair position using the beach chair positioner. The left shoulder and upper extremity were prepped with ChloraPrep solution before being draped sterilely. Preoperative antibiotics were administered. A standard anterior approach to the shoulder was made through an approximately 4-5 inch incision. The incision was carried down through the subcutaneous tissues to expose the deltopectoral fascia. The interval between the deltoid and pectoralis muscles was  identified and this plane developed, retracting the cephalic vein laterally with the deltoid muscle. The conjoined tendon was identified. Its lateral margin was dissected and the Kolbel self-retraining retractor inserted. The "three sisters" were identified and cauterized. Bursal tissues were removed to improve visualization. The biceps tendon was identified at the inferior end of the bicipital groove.  It was tenodesed to the adjacent pectoralis major tendon utilizing two #0 Ethibond interrupted sutures.  The biceps tendon was then cut just proximal to this and followed up through the bicipital groove, releasing the rotator interval to the glenoid.  An osteotome was used to osteotomize the greater and lesser tuberosities before the humeral head was removed.   Attention was directed to the glenoid. The remaining biceps tendon was removed before the labrum was debrided circumferentially. Utilizing the appropriate guide referencing the inferior end of the glenoid, the guidewire was drilled into the glenoid neck. After verifying its position, it was overreamed with the mini-baseplate reamer to create a flat surface before the stem reamer was utilized. The superior and inferior peg sites were reamed using the appropriate guide to complete the glenoid preparation. The permanent mini-baseplate was impacted into place. It was stabilized with a 20 x 4.5 mm central screw and four peripheral screws. Locking caps were placed over the superior and inferior screws. The permanent 38 mm concentric glenosphere with 2 mm of lateral offset was then impacted into place and its Morse taper locking mechanism verified using manual distraction.  Attention was directed to the humeral side. The humeral canal was prepared utilizing the tapered stem  reamers sequentially beginning with the 7 mm stem and progressing to a 9 mm stem. This demonstrated a good tight fit. The metaphyseal region was then prepared using the appropriate planar  device. The trial body and small stem was put together on the back table and a trial reduction performed using the +0 mm and +3 mm inserts. With the last 3 mm insert, the arm demonstrated excellent range of motion as the hand could be brought across the chest to the opposite shoulder and brought to the top of the patient's head and to the patient's ear. The shoulder appeared stable throughout this range of motion. The joint was dislocated and the trial components removed. The permanent 9 mm stem with the small body was impacted into place with care taken to maintain the appropriate version. After inserting the screw to connect the body with the stem, a repeat trial reduction with the +3 mm insert again demonstrated excellent stability with the findings as described above. Therefore, the shoulder was re-dislocated and the permanent +3 mm insert impacted into place. After verifying its locking mechanism, the shoulder was relocated using two finger pressure and again placed through a range of motion with the findings as described above.  Of note, two drill holes were predrilled into the lateral aspect of the proximal humeral shaft and two #2 fiber wires were passed in preparation for stabilizing the tuberosities at the end of the case.  Several #2 FiberWire sutures were placed in a side-to-side fashion to close the greater and lesser tuberosities, incorporating the proximal humeral stem, before the two sutures placed through the humeral holes were passed through the anterior aspect of the supraspinatus tendon and superior aspect of the subscapularis tendon and tied securely to further reinforce the stabilization of the tuberosities.  The wound was copiously irrigated with bacitracin saline solution using the jet lavage system before a total of 20 cc of Exparel diluted out to 60 cc with normal saline and 30 cc of 0.5% Sensorcaine with epinephrine was injected into the pericapsular and peri-incisional tissues to help  with postoperative analgesia. The subscapularis tendon was reapproximated using #2 FiberWire interrupted sutures. The deltopectoral interval was closed using #0 Vicryl interrupted sutures before the subcutaneous tissues were closed using 2-0 Vicryl interrupted sutures. The skin was closed using staples. Prior to closing the skin, 1 g of transexemic acid in 10 cc of normal saline was injected intra-articularly to help with postoperative bleeding. A sterile occlusive dressing was applied to the wound before the arm was placed into a shoulder immobilizer with an abduction pillow. A Polar Care system also was applied to the shoulder. The patient was then transferred back to a hospital bed before being awakened, extubated, and returned to the recovery room in satisfactory condition after tolerating the procedure well.

## 2018-04-27 ENCOUNTER — Encounter: Payer: Self-pay | Admitting: Surgery

## 2018-04-27 LAB — CBC WITH DIFFERENTIAL/PLATELET
BASOS PCT: 1 %
Basophils Absolute: 0.1 10*3/uL (ref 0–0.1)
EOS ABS: 0.1 10*3/uL (ref 0–0.7)
EOS PCT: 0 %
HEMATOCRIT: 32.1 % — AB (ref 35.0–47.0)
HEMOGLOBIN: 10.6 g/dL — AB (ref 12.0–16.0)
LYMPHS PCT: 6 %
Lymphs Abs: 1.2 10*3/uL (ref 1.0–3.6)
MCH: 29.6 pg (ref 26.0–34.0)
MCHC: 33.1 g/dL (ref 32.0–36.0)
MCV: 89.3 fL (ref 80.0–100.0)
Monocytes Absolute: 1.4 10*3/uL — ABNORMAL HIGH (ref 0.2–0.9)
Monocytes Relative: 7 %
Neutro Abs: 17.6 10*3/uL — ABNORMAL HIGH (ref 1.4–6.5)
Neutrophils Relative %: 86 %
Platelets: 252 10*3/uL (ref 150–440)
RBC: 3.6 MIL/uL — AB (ref 3.80–5.20)
RDW: 15.9 % — ABNORMAL HIGH (ref 11.5–14.5)
WBC: 20.4 10*3/uL — ABNORMAL HIGH (ref 3.6–11.0)

## 2018-04-27 LAB — BASIC METABOLIC PANEL
ANION GAP: 9 (ref 5–15)
BUN: 15 mg/dL (ref 8–23)
CHLORIDE: 104 mmol/L (ref 98–111)
CO2: 25 mmol/L (ref 22–32)
CREATININE: 1.01 mg/dL — AB (ref 0.44–1.00)
Calcium: 8.2 mg/dL — ABNORMAL LOW (ref 8.9–10.3)
GFR calc non Af Amer: 58 mL/min — ABNORMAL LOW (ref >60)
Glucose, Bld: 132 mg/dL — ABNORMAL HIGH (ref 70–99)
POTASSIUM: 4.1 mmol/L (ref 3.5–5.1)
Sodium: 138 mmol/L (ref 135–145)

## 2018-04-27 MED ORDER — OXYCODONE HCL 5 MG PO TABS: 5.0000 mg | ORAL_TABLET | ORAL | 0 refills | Status: DC | PRN

## 2018-04-27 MED ORDER — PANTOPRAZOLE SODIUM 40 MG IV SOLR
40.0000 mg | Freq: Two times a day (BID) | INTRAVENOUS | Status: DC
  Administered 2018-04-27 (×2): 40 mg via INTRAVENOUS
  Filled 2018-04-27 (×2): qty 40

## 2018-04-27 MED ORDER — NITROFURANTOIN MONOHYD MACRO 100 MG PO CAPS
100.0000 mg | ORAL_CAPSULE | Freq: Two times a day (BID) | ORAL | 0 refills | Status: DC
Start: 2018-04-27 — End: 2018-10-24

## 2018-04-27 NOTE — Discharge Summary (Addendum)
Physician Discharge Summary  Patient ID: Maria Warren MRN: [id number] DOB/AGE: Dec 22, 1955 62 y.o.  Admit date: 04/26/2018 Discharge date: 04/27/2018  Admission Diagnoses:  CLOSED FRACTURE OF PROXIMAL END OF LEFT HUMERUS WITH DELAYED HEALING  Discharge Diagnoses: Patient Active Problem List   Diagnosis Date Noted  . Status post reverse arthroplasty of shoulder, left 04/26/2018  . Osteoarthritis of left knee 01/29/2018  . Adjustment disorder with mixed disturbance of emotions and conduct 08/09/2017  . Cocaine abuse ([place]) 08/09/2017  . Tobacco use disorder 02/17/2016  . Major depressive disorder, recurrent severe without psychotic features ([place]) 02/16/2016  . Cannabis use disorder, moderate, dependence ([place]) 02/16/2016  . Hypertension 02/16/2016  . Hypothyroidism 02/16/2016  . Coronary artery disease 02/16/2016  . Suicidal ideation 02/16/2016    Past Medical History:  Diagnosis Date  . Anxiety   . Arthritis    knees, lower back  . COPD (chronic obstructive pulmonary disease) ([place])   . Coronary artery disease   . Depression   . GERD (gastroesophageal reflux disease)   . Headache    Pain over left eye, then vision in left eye goes black, quickly resolves.  Said Dr. Melony Warren office aware.  . Hypertension   . Hypothyroidism   . Lower extremity edema   . Thyroid disease   . Wears dentures    full upper   Transfusion: None.   Consultants (if any):   Discharged Condition: Improved  Hospital Course: Maria Warren is an 62 y.o. female who was admitted 04/26/2018 with a diagnosis of a nonunion of a left proximal humerus fracture and went to the operating room on 04/26/2018 and underwent the above named procedures.    Surgeries: Procedure(s): REVERSE SHOULDER ARTHROPLASTY on 04/26/2018 Patient tolerated the surgery well. Taken to PACU where she was stabilized and then transferred to the orthopedic floor.  Started on Lovenox 40mg  q 24 hrs. Foot pumps applied bilaterally at 80 mm.  Heels elevated on bed with rolled towels. No evidence of DVT. Negative Homan. Physical therapy started on day #1 for gait training and transfer. OT started day #1 for ADL and assisted devices.  Patient's IV was d/c on POD1.  Implants: All press-fit Integra system with a 9 mm stem, a small metaphyseal body, a +3 mm humeral platform, a mini baseplate, and a 38 mm concentric 2 mm laterally offset glenosphere.  She was given perioperative antibiotics:  Anti-infectives (From admission, onward)   Start     Dose/Rate Route Frequency Ordered Stop   04/26/18 1600  ceFAZolin (ANCEF) IVPB 2g/100 mL premix     2 g 200 mL/hr over 30 Minutes Intravenous Every 6 hours 04/26/18 1423 04/27/18 0528   04/26/18 0844  ceFAZolin (ANCEF) 2-4 GM/100ML-% IVPB    Note to Pharmacy:  Maria Warren   : cabinet override      04/26/18 0844 04/26/18 1052   04/26/18 0630  ceFAZolin (ANCEF) IVPB 2g/100 mL premix     2 g 200 mL/hr over 30 Minutes Intravenous  Once 04/26/18 0622 04/26/18 1107    .  She was given sequential compression devices, early ambulation, and Lovenox for DVT prophylaxis.  She benefited maximally from the hospital stay and there were no complications.    Recent vital signs:  Vitals:   04/27/18 0422 04/27/18 0735  BP: 125/76 (!) 142/72  Pulse: 66 72  Resp: 19 20  Temp: 98 F (36.7 C) 98.1 F (36.7 C)  SpO2: 94% 94%    Recent laboratory studies:  Lab Results  Component Value Date   HGB 11.7 (L) 04/20/2018   HGB 10.0 (L) 02/01/2018   HGB 9.6 (L) 01/31/2018   Lab Results  Component Value Date   WBC 10.9 04/20/2018   PLT 291 04/20/2018   Lab Results  Component Value Date   INR 0.87 04/20/2018   Lab Results  Component Value Date   NA 138 04/27/2018   K 4.1 04/27/2018   CL 104 04/27/2018   CO2 25 04/27/2018   BUN 15 04/27/2018   CREATININE 1.01 (H) 04/27/2018   GLUCOSE 132 (H) 04/27/2018    Discharge Medications:   Allergies as of 04/27/2018      Reactions   Topiramate  Nausea Only, Anxiety   Lyrica [pregabalin] Nausea Only   Zoloft [sertraline Hcl] Nausea Only   Bupropion Nausea Only   Light headed Light headed      Medication List    TAKE these medications   acetaminophen 500 MG tablet Commonly known as:  TYLENOL Take 1,000 mg by mouth every 6 (six) hours as needed (for pain.).   albuterol 108 (90 Base) MCG/ACT inhaler Commonly known as:  PROVENTIL HFA;VENTOLIN HFA Inhale 2 puffs into the lungs every 4 (four) hours as needed for wheezing or shortness of breath.   aspirin 325 MG EC tablet Take 1 tablet (325 mg total) by mouth daily with breakfast. What changed:  Another medication with the same name was removed. Continue taking this medication, and follow the directions you see here.   atorvastatin 80 MG tablet Commonly known as:  LIPITOR Take 80 mg by mouth daily.   b complex vitamins tablet Take 1 tablet by mouth daily.   busPIRone 10 MG tablet Commonly known as:  BUSPAR Take 10 mg by mouth 3 (three) times daily.   calcitRIOL 0.25 MCG capsule Commonly known as:  ROCALTROL Take 0.5 mcg by mouth 2 (two) times daily.   cholecalciferol 1000 units tablet Commonly known as:  VITAMIN D Take 1,000 Units by mouth daily.   diphenhydrAMINE 25 mg capsule Commonly known as:  BENADRYL Take 25 mg by mouth every 6 (six) hours as needed (for nose/watery eyes).   docusate sodium 100 MG capsule Commonly known as:  COLACE Take 1 capsule (100 mg total) by mouth 2 (two) times daily.   escitalopram 20 MG tablet Commonly known as:  LEXAPRO Take 20 mg by mouth daily.   Fluticasone-Salmeterol 250-50 MCG/DOSE Aepb Commonly known as:  ADVAIR Inhale 1 puff into the lungs 2 (two) times daily as needed (for respiratory issues.).   hydrOXYzine 25 MG tablet Commonly known as:  ATARAX/VISTARIL Take 25 mg by mouth 3 (three) times daily as needed for anxiety.   isosorbide mononitrate 30 MG 24 hr tablet Commonly known as:  IMDUR Take 30 mg by mouth  daily.   levothyroxine 125 MCG tablet Commonly known as:  SYNTHROID, LEVOTHROID Take 125 mcg by mouth daily before breakfast.   meloxicam 15 MG tablet Commonly known as:  MOBIC Take 15 mg by mouth daily.   nitrofurantoin (macrocrystal-monohydrate) 100 MG capsule Commonly known as:  MACROBID Take 1 capsule (100 mg total) by mouth 2 (two) times daily.   nitroGLYCERIN 0.4 MG SL tablet Commonly known as:  NITROSTAT Place 0.4 mg under the tongue every 5 (five) minutes as needed for chest pain.   oxyCODONE 5 MG immediate release tablet Commonly known as:  Oxy IR/ROXICODONE Take 1-2 tablets (5-10 mg total) by mouth every 4 (four) hours as needed for moderate pain (pain score  4-6).   pantoprazole 40 MG tablet Commonly known as:  PROTONIX Take 40 mg by mouth daily before breakfast.   polyethylene glycol packet Commonly known as:  MIRALAX / GLYCOLAX Take 17 g by mouth daily as needed (for constipation.).   tiotropium 18 MCG inhalation capsule Commonly known as:  SPIRIVA Place 18 mcg into inhaler and inhale daily.   traZODone 50 MG tablet Commonly known as:  DESYREL Take 50 mg by mouth at bedtime.   vitamin B-12 500 MCG tablet Commonly known as:  CYANOCOBALAMIN Take 1,000 mcg by mouth daily.   vitamin E 200 UNIT capsule Take 200 Units by mouth daily.      Diagnostic Studies: Dg Shoulder Left Port  Result Date: 04/26/2018 CLINICAL DATA:  Status post left shoulder replacement EXAM: LEFT SHOULDER - 1 VIEW COMPARISON:  03/14/2018 FINDINGS: Previously seen proximal humeral fracture is again identified although a prosthesis is now noted in place. No new focal abnormality is noted. IMPRESSION: Status post left shoulder replacement. Electronically Signed   By: Inez Catalina M.D.   On: 04/26/2018 13:56   Disposition: Plan will be for discharge home today pending progress with PT.  Will discharge home with antibiotics for UTI.  Follow-up Information    Lattie Corns, PA-C Follow  up in 14 day(s).   Specialty:  Physician Assistant Why:  Electa Sniff information: [place] [address] [phone]          Signed: Judson Roch PA-C 04/27/2018, 8:08 AM

## 2018-04-27 NOTE — Care Management Note (Signed)
Case Management Note  Patient Details  Name: ROMAYNE TICAS MRN: [id number] Date of Birth: Feb 19, 1956  Subjective/Objective:       Patient currently has discharge orders. PT has recommended outpatient PT but the MD has ordered Home Health. The patient prefers home health at this time since she utilizes [facility] for transportation and fears she would not be able to establish a consistent transport schedule. Patient was provided choice and she has used [facility] in the past with good results and prefers to use them again for services. Referral placed with [place] from [facility] for services. Patient has transportation home established. RNCM will sign off.  Ines Bloomer RN BSN RNCM [phone]             Action/Plan:   Expected Discharge Date:  04/27/18               Expected Discharge Plan:     In-House Referral:     Discharge planning Services  CM Consult  Post Acute Care Choice:  Home Health Choice offered to:  Patient  DME Arranged:    DME Agency:     HH Arranged:  PT, OT, Nurse's Aide [place] Agency:  [facility]  Status of Service:  Completed, signed off  If discussed at [place] of Stay Meetings, dates discussed:    Additional Comments:  Marquan Vokes A Sharian Delia, RN 04/27/2018, 11:01 AM

## 2018-04-27 NOTE — Progress Notes (Signed)
Physical Therapy Treatment Patient Details Name: LURIE MULLANE MRN: [id number] DOB: Feb 10, 1956 Today's Date: 04/27/2018    History of Present Illness 62yo female pt presents to hospital on 04/26/18 for a L reverse total shoulder replacement performed by Dr. Roland Rack. Pt has a past medical history that includes COPD, GERD, anxiety and depression, as well as a L TKR on 01/29/18 which pt states she is 100% recovered from.    PT Comments    Upon arrival pt up in chair stating she recently completed OT evaluation and treatment. Pt independent with transferring sit>stand and amb 200' with supervision. Pt instructed in stair training ascending and descending 4 steps with rail on B sides to simulate entering and exiting home. Pt performs amb and stairs without complication or LOB. Pt tolerated session well and states that she is no more fatigued than prior to admission. Pt has met all PT goals and safe to D/c at this time. Pt could benefit from continued skilled therapy at this time to improve deficits toward PLOF. PT will continue to work with pt bid while admitted. D/c recommendations continue to be home with outpatient PT.    Follow Up Recommendations  Outpatient PT     Equipment Recommendations  None recommended by PT    Recommendations for Other Services       Precautions / Restrictions Precautions Precautions: Shoulder Shoulder Interventions: Shoulder sling/immobilizer;At all times;Off for dressing/bathing/exercises;Shoulder abduction pillow Precaution Booklet Issued: Yes (comment) Required Braces or Orthoses: Sling Restrictions Weight Bearing Restrictions: Yes LUE Weight Bearing: Non weight bearing    Mobility  Bed Mobility Overal bed mobility: Needs Assistance Bed Mobility: Supine to Sit     Supine to sit: Supervision     General bed mobility comments: not assessed this visit. Up in chair upon arrival prefers to remain up at end of session  Transfers Overall transfer level:  Independent Equipment used: None Transfers: Sit to/from Stand Sit to Stand: Independent         General transfer comment: pt independent with sit<>stand transfers  Ambulation/Gait Ambulation/Gait assistance: Supervision Gait Distance (Feet): 200 Feet Assistive device: None Gait Pattern/deviations: Step-through pattern;WFL(Within Functional Limits)     General Gait Details: pt amb 200' with close supervision. performs without LOB or difficulties. Declines change in symptoms during amb   Stairs Stairs: Yes Stairs assistance: Supervision Stair Management: One rail Right;One rail Left Number of Stairs: 4 General stair comments: pt ascends and descends 4 stairs with supervision safely alternating steps without issue to simulate entering and exiting home enviornment   Wheelchair Mobility    Modified Rankin (Stroke Patients Only)       Balance Overall balance assessment: Independent Sitting-balance support: Feet supported;No upper extremity supported Sitting balance-Leahy Scale: Good     Standing balance support: No upper extremity supported Standing balance-Leahy Scale: Good                              Cognition Arousal/Alertness: Awake/alert Behavior During Therapy: WFL for tasks assessed/performed Overall Cognitive Status: Within Functional Limits for tasks assessed                                        Exercises Other Exercises Other Exercises: deferred this visit due to OT recently completing. OT provided and reviewed HEP packet. Other Exercises: Pt instructed in shoulder HEP packet including AROM  for elbow, wrist, and hand x10 each, AROM head/neck, and PROM shoulder flexion and external rotation x10 each. Pt verbalized understanding and would benefit from additional training to support recall and carryover for optimal recovery    General Comments General comments (skin integrity, edema, etc.): polar care and sling readjusted to  provide optimal pain mgt, positioning, and skin protection to minimize skin breakdown.      Pertinent Vitals/Pain Pain Assessment: 0-10 Pain Score: 7  Pain Location: L shoulder Pain Descriptors / Indicators: Aching;Guarding;Constant Pain Intervention(s): Limited activity within patient's tolerance;Monitored during session;Premedicated before session;Ice applied;Repositioned    Home Living Family/patient expects to be discharged to:: Private residence Living Arrangements: Alone Available Help at Discharge: Friend(s);Available PRN/intermittently([place]) Type of Home: House Home Access: Stairs to enter Entrance Stairs-Rails: Left;Right Home Layout: One level Home Equipment: None      Prior Function Level of Independence: Independent      Comments: pt independent in community and with ADL. Denies previous falls. Uses [facility] for transportation (which pt notes is difficult to coordinate).   PT Goals (current goals can now be found in the care plan section) Acute Rehab PT Goals Patient Stated Goal: to be pain free PT Goal Formulation: With patient Time For Goal Achievement: 05/10/18 Potential to Achieve Goals: Good Progress towards PT goals: Progressing toward goals    Frequency    BID      PT Plan Current plan remains appropriate    Co-evaluation              AM-PAC PT "6 Clicks" Daily Activity  Outcome Measure  Difficulty turning over in bed (including adjusting bedclothes, sheets and blankets)?: None Difficulty moving from lying on back to sitting on the side of the bed? : None Difficulty sitting down on and standing up from a chair with arms (e.g., wheelchair, bedside commode, etc,.)?: None Help needed moving to and from a bed to chair (including a wheelchair)?: None Help needed walking in hospital room?: None Help needed climbing 3-5 steps with a railing? : None 6 Click Score: 24    End of Session Equipment Utilized During Treatment: Gait belt Activity  Tolerance: Patient tolerated treatment well;No increased pain Patient left: in chair;with call bell/phone within reach;with chair alarm set Nurse Communication: Other (comment)(d/c recommendation) PT Visit Diagnosis: Pain;Unsteadiness on feet (R26.81) Pain - Right/Left: Left Pain - part of body: Shoulder     Time: 3435-6861 PT Time Calculation (min) (ACUTE ONLY): 12 min  Charges:                       G Codes:       Elynore Dolinski Marylu Lund, SPT    Skiler Tye 04/27/2018, 9:55 AM

## 2018-04-27 NOTE — Progress Notes (Signed)
Discharge instructions and follow up appointments reviewed with patient. Prescriptions given to patient. Patient without questions at this time. IV removed without complication. Awaiting ride from family.

## 2018-04-27 NOTE — Evaluation (Signed)
Occupational Therapy Evaluation Patient Details Name: Maria Warren MRN: [id number] DOB: 1955/12/04 Today's Date: 04/27/2018    History of Present Illness 62yo female pt presents to hospital on 04/26/18 for a L reverse total shoulder replacement performed by Dr. Roland Rack. Pt has a past medical history that includes COPD, GERD, anxiety and depression, as well as a L TKR on 01/29/18 which pt states she is 100% recovered from.   Clinical Impression   Patient was seen for an OT evaluation this date. Pt lives in a 1 story home by herself and a dog. Prior to surgery, pt was active and independent. Pt has friends who can provide PRN assist at home while recovering. Pt has orders for LUE to be immobilized and will be NWBing per MD. Patient presents with impaired strength/ROM, pain, and impaired functional use of her LUE. These impairments result in a decreased ability to perform self care tasks requiring mod assist for UB/LB dressing and bathing and max assist for application of polar care, compression stockings, and sling/immobilizer. Pt reports she can get help for these tasks at home. Pt instructed in polar care mgt, compression stockings mgt, sling/immobilizer mgt, ROM exercises for LUE, LUE precautions, adaptive strategies for bathing/dressing/toileting/grooming, positioning and considerations for sleep, and home/routines modifications to maximize falls prevention, safety, and independence. Handouts provided. OT adjusted sling/immobilizer and polar care to improve comfort, optimize positioning, and to maximize skin integrity/safety. Pt verbalized understanding of all education/training provided but will benefit from additional skilled OT services to maximize recall and carryover of learned techniques for optimal recovery and safety. Pt will benefit from skilled OT services to address these limitations and improve independence in daily tasks. Recommend HHOT services to continue therapy to maximize return to PLOF,  address home/routines modifications and safety, minimize falls risk, and minimize caregiver burden.       Follow Up Recommendations  Home health OT    Equipment Recommendations  None recommended by OT    Recommendations for Other Services       Precautions / Restrictions Precautions Precautions: Shoulder Shoulder Interventions: Shoulder sling/immobilizer;At all times;Off for dressing/bathing/exercises;Shoulder abduction pillow Precaution Booklet Issued: Yes (comment) Restrictions Weight Bearing Restrictions: Yes LUE Weight Bearing: Non weight bearing      Mobility Bed Mobility Overal bed mobility: Needs Assistance Bed Mobility: Supine to Sit     Supine to sit: Supervision        Transfers Overall transfer level: Needs assistance Equipment used: None Transfers: Sit to/from Stand Sit to Stand: Supervision              Balance Overall balance assessment: Needs assistance Sitting-balance support: Feet supported;No upper extremity supported Sitting balance-Leahy Scale: Good     Standing balance support: No upper extremity supported Standing balance-Leahy Scale: Good                             ADL either performed or assessed with clinical judgement   ADL Overall ADL's : Needs assistance/impaired Eating/Feeding: Modified independent;Sitting   Grooming: Sitting;Minimal assistance Grooming Details (indicate cue type and reason): min assist for underarm grooming, instructed in hemi techniques for underarm grooming Upper Body Bathing: Sitting;Minimal assistance Upper Body Bathing Details (indicate cue type and reason): instructed in techniques for underarm bathing and safety considerations Lower Body Bathing: Sit to/from stand;Supervison/ safety Lower Body Bathing Details (indicate cue type and reason): cues to maintain LUE precautions Upper Body Dressing : Sitting;Minimal assistance Upper Body Dressing Details (indicate  cue type and reason):  instructed in hemi techniques for UB dressing with pt able to return demo requiring min assist  Lower Body Dressing: Sit to/from stand;Set up;Min guard Lower Body Dressing Details (indicate cue type and reason): initial CGA provided for LB dressing, pt able to perform with R hand and additional time/effort, especially to pull up over L hip Toilet Transfer: Regular Toilet;Supervision/safety;Ambulation   Toileting- Clothing Manipulation and Hygiene: Independent       Functional mobility during ADLs: Supervision/safety       Vision Patient Visual Report: No change from baseline       Perception     Praxis      Pertinent Vitals/Pain Pain Assessment: 0-10 Pain Score: 8  Pain Location: L shoulder at start of session (RN provided meds at start), pt notes "feeling better" near end of session Pain Descriptors / Indicators: Aching;Guarding;Constant Pain Intervention(s): Limited activity within patient's tolerance;Monitored during session;RN gave pain meds during session;Ice applied;Repositioned     Hand Dominance Right   Extremity/Trunk Assessment Upper Extremity Assessment Upper Extremity Assessment: LUE deficits/detail(RUE WFL) LUE Deficits / Details: sensation intact, AROM elbow, wrist, hand WFL, and good grip strength LUE: Unable to fully assess due to pain;Unable to fully assess due to immobilization LUE Sensation: WNL LUE Coordination: decreased gross motor   Lower Extremity Assessment Lower Extremity Assessment: Overall WFL for tasks assessed   Cervical / Trunk Assessment Cervical / Trunk Assessment: Normal   Communication Communication Communication: No difficulties   Cognition Arousal/Alertness: Awake/alert Behavior During Therapy: WFL for tasks assessed/performed Overall Cognitive Status: Within Functional Limits for tasks assessed                                     General Comments  polar care and sling readjusted to provide optimal pain mgt,  positioning, and skin protection to minimize skin breakdown.    Exercises Other Exercises Other Exercises: Pt instructed in comprehensive shoulder surgery OT protocol for bathing, dressing, grooming, polar care wear/mgt, compression stocking wear/mgt, and sling/immobilizer wear/mgt. Handout provided. Pt verbalized understanding but would benefit from additional training to maximize recall and independent carryover Other Exercises: Pt instructed in shoulder HEP packet including AROM for elbow, wrist, and hand x10 each, AROM head/neck, and PROM shoulder flexion and external rotation x10 each. Pt verbalized understanding and would benefit from additional training to support recall and carryover for optimal recovery   Shoulder Instructions      Home Living Family/patient expects to be discharged to:: Private residence Living Arrangements: Alone Available Help at Discharge: Friend(s);Available PRN/intermittently(Rodney) Type of Home: House Home Access: Stairs to enter [employer] of Steps: 2 Entrance Stairs-Rails: Left;Right Home Layout: One level     Bathroom Shower/Tub: Teacher, early years/pre: Standard     Home Equipment: None          Prior Functioning/Environment Level of Independence: Independent        Comments: pt independent in community and with ADL. Denies previous falls. Uses [employer] for transportation (which pt notes is difficult to coordinate).        OT Problem List: Decreased strength;Decreased knowledge of use of DME or AE;Decreased range of motion;Decreased knowledge of precautions;Impaired UE functional use;Pain      OT Treatment/Interventions: Self-care/ADL training;Therapeutic exercise;Therapeutic activities;DME and/or AE instruction;Patient/family education    OT Goals(Current goals can be found in the care plan section) Acute Rehab OT Goals Patient Stated  Goal: go home and recover OT Goal Formulation: With patient Time For Goal  Achievement: 05/11/18 Potential to Achieve Goals: Good ADL Goals Pt Will Perform Grooming: with modified independence;sitting(using learned hemi technique, w/ shoulder precautions t/o) Pt Will Perform Upper Body Dressing: sitting;with modified independence(using learned hemi technique, w/ shoulder precautions t/o) Additional ADL Goal #1: Pt will independently instruct family/friend/caregiver to don/doff sling Additional ADL Goal #2: Pt will independently instruct family/friend/caregiver to don/doff polar care  OT Frequency: Min 2X/week   Barriers to D/C: Decreased caregiver support          Co-evaluation              AM-PAC PT "6 Clicks" Daily Activity     Outcome Measure Help from another person eating meals?: A Little Help from another person taking care of personal grooming?: A Little Help from another person toileting, which includes using toliet, bedpan, or urinal?: None Help from another person bathing (including washing, rinsing, drying)?: A Little Help from another person to put on and taking off regular upper body clothing?: A Little Help from another person to put on and taking off regular lower body clothing?: A Little 6 Click Score: 19   End of Session    Activity Tolerance: Patient tolerated treatment well Patient left: in chair;with call bell/phone within reach;with chair alarm set  OT Visit Diagnosis: Other abnormalities of gait and mobility (R26.89);Pain Pain - Right/Left: Left Pain - part of body: Shoulder                Time: 0233-4356 OT Time Calculation (min): 48 min Charges:  OT General Charges $OT Visit: 1 Visit OT Evaluation $OT Eval Low Complexity: 1 Low OT Treatments $Self Care/Home Management : 23-37 mins $Therapeutic Exercise: 8-22 mins  Jeni Salles, MPH, MS, OTR/L ascom [phone] 04/27/18, 9:44 AM

## 2018-04-27 NOTE — Progress Notes (Signed)
  Subjective: 1 Day Post-Op Procedure(s) (LRB): REVERSE SHOULDER ARTHROPLASTY (Left) Patient reports pain as mild.   Patient is well, and has had no acute complaints or problems Plan is to go Home after hospital stay. Negative for chest pain and shortness of breath Fever: no Gastrointestinal:Negative for nausea and vomiting  Objective: Vital signs in last 24 hours: Temp:  [97.1 F (36.2 C)-99 F (37.2 C)] 98.1 F (36.7 C) (06/28 0735) Pulse Rate:  [66-110] 72 (06/28 0735) Resp:  [12-20] 20 (06/28 0735) BP: (98-151)/(59-89) 142/72 (06/28 0735) SpO2:  [93 %-100 %] 94 % (06/28 0735) Weight:  [83 kg (183 lb)] 83 kg (183 lb) (06/27 0917)  Intake/Output from previous day:  Intake/Output Summary (Last 24 hours) at 04/27/2018 0759 Last data filed at 04/27/2018 0536 Gross per 24 hour  Intake 3400 ml  Output 250 ml  Net 3150 ml    Intake/Output this shift: No intake/output data recorded.  Labs: No results for input(s): HGB in the last 72 hours. No results for input(s): WBC, RBC, HCT, PLT in the last 72 hours. Recent Labs    04/27/18 0648  NA 138  K 4.1  CL 104  CO2 25  BUN 15  CREATININE 1.01*  GLUCOSE 132*  CALCIUM 8.2*   No results for input(s): LABPT, INR in the last 72 hours.   EXAM General - Patient is Alert, Appropriate and Oriented Extremity - ABD soft Sensation intact distally Intact pulses distally Dorsiflexion/Plantar flexion intact Incision: dressing C/D/I No cellulitis present Dressing/Incision - clean, dry, no drainage Motor Function - intact, moving foot and toes well on exam. Intact to light touch to the left upper extremity. Abdomen soft on palpation, normal bowel sounds.  Past Medical History:  Diagnosis Date  . Anxiety   . Arthritis    knees, lower back  . COPD (chronic obstructive pulmonary disease) ([place])   . Coronary artery disease   . Depression   . GERD (gastroesophageal reflux disease)   . Headache    Pain over left eye, then vision  in left eye goes black, quickly resolves.  Said Dr. Melony Overly office aware.  . Hypertension   . Hypothyroidism   . Lower extremity edema   . Thyroid disease   . Wears dentures    full upper    Assessment/Plan: 1 Day Post-Op Procedure(s) (LRB): REVERSE SHOULDER ARTHROPLASTY (Left) Active Problems:   Status post reverse arthroplasty of shoulder, left  Estimated body mass index is 29.54 kg/m as calculated from the following:   Height as of this encounter: 5\' 6"  (1.676 m).   Weight as of this encounter: 83 kg (183 lb). Advance diet Up with therapy D/C IV fluids when tolerating po intake.  Labs reviewed this morning.  CBC is pending, BMP returned. Up with therapy today.  Pt is passing gas without pain or difficulty. UA prior to surgery demonstrated a UTI, will plan on discharging home on antibiotics. Plan will be for discharge home today pending progress with PT.  DVT Prophylaxis - Aspirin, Lovenox, Foot Pumps and TED hose Non-weightbearing to the left arm.  Raquel Najee Manninen, PA-C [facility] Orthopaedic Surgery 04/27/2018, 7:59 AM

## 2018-04-27 NOTE — Discharge Instructions (Signed)
Diet: As you were doing prior to hospitalization   Shower:  May shower but keep the wounds dry, use an occlusive plastic wrap, NO SOAKING IN TUB.  If the bandage gets wet, change with a clean dry gauze.  Dressing:  You may change your dressing as needed. Change the dressing with sterile gauze dressing.    Activity:  Increase activity slowly as tolerated, but follow the weight bearing instructions below.  No lifting or driving for 6 weeks.  Weight Bearing:   Non-weightbearing to the left arm.  Blood Clot Prevention: Take 1 325mg  aspirin daily or 4 81mg  aspirin daily.  To prevent constipation: you may use a stool softener such as -  Colace (over the counter) 100 mg by mouth twice a day  Drink plenty of fluids (prune juice may be helpful) and high fiber foods Miralax (over the counter) for constipation as needed.    Itching:  If you experience itching with your medications, try taking only a single pain pill, or even half a pain pill at a time.  You may take up to 10 pain pills per day, and you can also use benadryl over the counter for itching or also to help with sleep.   Precautions:  If you experience chest pain or shortness of breath - call 911 immediately for transfer to the hospital emergency department!!  If you develop a fever greater that 101 F, purulent drainage from wound, increased redness or drainage from wound, or calf pain-Call [place]                                              Follow- Up Appointment:  Please call for an appointment to be seen in 2 weeks at [facility]

## 2018-05-01 ENCOUNTER — Telehealth: Payer: Self-pay

## 2018-05-01 LAB — SURGICAL PATHOLOGY

## 2018-05-01 NOTE — Telephone Encounter (Signed)
EMMI Follow-up: Noted on the report that patient had unfilled Rxs.  I left a message for Maria Warren to call me at her convenience. Will follow-up again.

## 2018-05-03 ENCOUNTER — Telehealth: Payer: Self-pay

## 2018-05-03 NOTE — Telephone Encounter (Signed)
EMMI Follow-up: 2nd follow-up call made today.  Maria Warren said she had her Rx filled and no needs today. I let her know there would be another automated call with a different series of questions and to let us know she had any concerns at that time.

## 2018-07-16 ENCOUNTER — Other Ambulatory Visit: Payer: Self-pay | Admitting: Family Medicine

## 2018-07-16 DIAGNOSIS — Z0000 Encounter for general adult medical examination without abnormal findings: Secondary | ICD-10-CM

## 2018-08-09 ENCOUNTER — Other Ambulatory Visit: Payer: Self-pay
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
## 2018-09-03 ENCOUNTER — Other Ambulatory Visit: Payer: Self-pay

## 2018-10-03 ENCOUNTER — Other Ambulatory Visit: Payer: Self-pay | Admitting: Gastroenterology

## 2018-10-03 DIAGNOSIS — R1084 Generalized abdominal pain: Secondary | ICD-10-CM

## 2018-10-03 DIAGNOSIS — R197 Diarrhea, unspecified: Secondary | ICD-10-CM

## 2018-10-03 DIAGNOSIS — R634 Abnormal weight loss: Secondary | ICD-10-CM

## 2018-10-04 ENCOUNTER — Other Ambulatory Visit
Admission: RE | Admit: 2018-10-04 | Discharge: 2018-10-04 | Disposition: A | Discharge status: Home / Self Care | Payer: Medicare Other | Source: Ambulatory Visit | Attending: Gastroenterology | Admitting: Gastroenterology

## 2018-10-04 DIAGNOSIS — R197 Diarrhea, unspecified: Secondary | ICD-10-CM | POA: Diagnosis present

## 2018-10-04 DIAGNOSIS — R1084 Generalized abdominal pain: Secondary | ICD-10-CM | POA: Diagnosis present

## 2018-10-04 DIAGNOSIS — R634 Abnormal weight loss: Secondary | ICD-10-CM | POA: Insufficient documentation

## 2018-10-04 LAB — GASTROINTESTINAL PANEL BY PCR, STOOL (REPLACES STOOL CULTURE)
ADENOVIRUS F40/41: NOT DETECTED
Astrovirus: NOT DETECTED
CAMPYLOBACTER SPECIES: NOT DETECTED
Cryptosporidium: NOT DETECTED
Cyclospora cayetanensis: NOT DETECTED
ENTEROAGGREGATIVE E COLI (EAEC): NOT DETECTED
ENTEROPATHOGENIC E COLI (EPEC): NOT DETECTED
Entamoeba histolytica: NOT DETECTED
Enterotoxigenic E coli (ETEC): NOT DETECTED
GIARDIA LAMBLIA: NOT DETECTED
NOROVIRUS GI/GII: NOT DETECTED
PLESIMONAS SHIGELLOIDES: NOT DETECTED
Rotavirus A: NOT DETECTED
SHIGELLA/ENTEROINVASIVE E COLI (EIEC): NOT DETECTED
Salmonella species: NOT DETECTED
Sapovirus (I, II, IV, and V): NOT DETECTED
Shiga like toxin producing E coli (STEC): NOT DETECTED
Vibrio cholerae: NOT DETECTED
Vibrio species: NOT DETECTED
Yersinia enterocolitica: NOT DETECTED

## 2018-10-04 LAB — C DIFFICILE QUICK SCREEN W PCR REFLEX
C DIFFICILE (CDIFF) INTERP: NOT DETECTED
C DIFFICILE (CDIFF) TOXIN: NEGATIVE
C DIFFICLE (CDIFF) ANTIGEN: NEGATIVE

## 2018-10-10 ENCOUNTER — Ambulatory Visit
Admission: RE | Admit: 2018-10-10 | Discharge: 2018-10-10 | Disposition: A | Discharge status: Home / Self Care | Payer: Medicare Other | Source: Ambulatory Visit | Attending: Family Medicine | Admitting: Family Medicine

## 2018-10-10 DIAGNOSIS — F17200 Nicotine dependence, unspecified, uncomplicated: Secondary | ICD-10-CM | POA: Diagnosis not present

## 2018-10-10 DIAGNOSIS — Z780 Asymptomatic menopausal state: Secondary | ICD-10-CM | POA: Insufficient documentation

## 2018-10-10 DIAGNOSIS — Z0000 Encounter for general adult medical examination without abnormal findings: Secondary | ICD-10-CM

## 2018-10-11 ENCOUNTER — Ambulatory Visit: Admission: RE | Admit: 2018-10-11 | Payer: Medicare Other | Source: Ambulatory Visit

## 2018-10-22 ENCOUNTER — Ambulatory Visit: Admission: RE | Admit: 2018-10-22 | Payer: Medicare Other | Source: Ambulatory Visit

## 2018-10-24 ENCOUNTER — Emergency Department
Admission: EM | Admit: 2018-10-24 | Discharge: 2018-10-24 | Disposition: A | Discharge status: Home / Self Care | Payer: Medicare Other | Attending: Emergency Medicine | Admitting: Emergency Medicine

## 2018-10-24 ENCOUNTER — Other Ambulatory Visit: Payer: Self-pay

## 2018-10-24 ENCOUNTER — Emergency Department: Payer: Medicare Other

## 2018-10-24 DIAGNOSIS — Z79899 Other long term (current) drug therapy: Secondary | ICD-10-CM | POA: Diagnosis not present

## 2018-10-24 DIAGNOSIS — I10 Essential (primary) hypertension: Secondary | ICD-10-CM | POA: Diagnosis not present

## 2018-10-24 DIAGNOSIS — F17290 Nicotine dependence, other tobacco product, uncomplicated: Secondary | ICD-10-CM | POA: Insufficient documentation

## 2018-10-24 DIAGNOSIS — R1010 Upper abdominal pain, unspecified: Secondary | ICD-10-CM | POA: Diagnosis not present

## 2018-10-24 DIAGNOSIS — Z96612 Presence of left artificial shoulder joint: Secondary | ICD-10-CM | POA: Diagnosis not present

## 2018-10-24 DIAGNOSIS — I2510 Atherosclerotic heart disease of native coronary artery without angina pectoris: Secondary | ICD-10-CM | POA: Insufficient documentation

## 2018-10-24 DIAGNOSIS — R112 Nausea with vomiting, unspecified: Secondary | ICD-10-CM | POA: Insufficient documentation

## 2018-10-24 DIAGNOSIS — E039 Hypothyroidism, unspecified: Secondary | ICD-10-CM | POA: Diagnosis not present

## 2018-10-24 DIAGNOSIS — Z96652 Presence of left artificial knee joint: Secondary | ICD-10-CM | POA: Insufficient documentation

## 2018-10-24 DIAGNOSIS — J449 Chronic obstructive pulmonary disease, unspecified: Secondary | ICD-10-CM | POA: Diagnosis not present

## 2018-10-24 DIAGNOSIS — F17210 Nicotine dependence, cigarettes, uncomplicated: Secondary | ICD-10-CM | POA: Diagnosis not present

## 2018-10-24 DIAGNOSIS — N390 Urinary tract infection, site not specified: Secondary | ICD-10-CM

## 2018-10-24 DIAGNOSIS — R109 Unspecified abdominal pain: Secondary | ICD-10-CM | POA: Diagnosis present

## 2018-10-24 LAB — CBC WITH DIFFERENTIAL/PLATELET
Abs Immature Granulocytes: 0.15 10*3/uL — ABNORMAL HIGH (ref 0.00–0.07)
Basophils Absolute: 0.1 10*3/uL (ref 0.0–0.1)
Basophils Relative: 0 %
EOS PCT: 2 %
Eosinophils Absolute: 0.3 10*3/uL (ref 0.0–0.5)
HEMATOCRIT: 31.9 % — AB (ref 36.0–46.0)
HEMOGLOBIN: 10.1 g/dL — AB (ref 12.0–15.0)
Immature Granulocytes: 1 %
LYMPHS PCT: 11 %
Lymphs Abs: 1.9 10*3/uL (ref 0.7–4.0)
MCH: 29 pg (ref 26.0–34.0)
MCHC: 31.7 g/dL (ref 30.0–36.0)
MCV: 91.7 fL (ref 80.0–100.0)
MONO ABS: 1.1 10*3/uL — AB (ref 0.1–1.0)
MONOS PCT: 7 %
Neutro Abs: 13.2 10*3/uL — ABNORMAL HIGH (ref 1.7–7.7)
Neutrophils Relative %: 79 %
Platelets: 306 10*3/uL (ref 150–400)
RBC: 3.48 MIL/uL — AB (ref 3.87–5.11)
RDW: 17.2 % — ABNORMAL HIGH (ref 11.5–15.5)
WBC: 16.8 10*3/uL — ABNORMAL HIGH (ref 4.0–10.5)
nRBC: 0 % (ref 0.0–0.2)

## 2018-10-24 LAB — URINALYSIS, COMPLETE (UACMP) WITH MICROSCOPIC
Bilirubin Urine: NEGATIVE
GLUCOSE, UA: NEGATIVE mg/dL
HGB URINE DIPSTICK: NEGATIVE
Ketones, ur: NEGATIVE mg/dL
Leukocytes, UA: NEGATIVE
NITRITE: POSITIVE — AB
Protein, ur: NEGATIVE mg/dL
SPECIFIC GRAVITY, URINE: 1.035 — AB (ref 1.005–1.030)
pH: 5 (ref 5.0–8.0)

## 2018-10-24 LAB — LIPASE, BLOOD: Lipase: 33 U/L (ref 11–51)

## 2018-10-24 LAB — COMPREHENSIVE METABOLIC PANEL
ALT: 17 U/L (ref 0–44)
ANION GAP: 8 (ref 5–15)
AST: 25 U/L (ref 15–41)
Albumin: 3.8 g/dL (ref 3.5–5.0)
Alkaline Phosphatase: 65 U/L (ref 38–126)
BILIRUBIN TOTAL: 0.4 mg/dL (ref 0.3–1.2)
BUN: 11 mg/dL (ref 8–23)
CO2: 26 mmol/L (ref 22–32)
CREATININE: 0.81 mg/dL (ref 0.44–1.00)
Calcium: 8.9 mg/dL (ref 8.9–10.3)
Chloride: 104 mmol/L (ref 98–111)
Glucose, Bld: 117 mg/dL — ABNORMAL HIGH (ref 70–99)
Potassium: 3.7 mmol/L (ref 3.5–5.1)
Sodium: 138 mmol/L (ref 135–145)
TOTAL PROTEIN: 7 g/dL (ref 6.5–8.1)

## 2018-10-24 LAB — TROPONIN I

## 2018-10-24 MED ORDER — ONDANSETRON HCL 4 MG/2ML IJ SOLN
4.0000 mg | Freq: Once | INTRAMUSCULAR | Status: AC
  Administered 2018-10-24: 4 mg via INTRAVENOUS
  Filled 2018-10-24: qty 2

## 2018-10-24 MED ORDER — SUCRALFATE 1 G PO TABS: 1.0000 g | ORAL_TABLET | Freq: Four times a day (QID) | ORAL | 0 refills | Status: DC

## 2018-10-24 MED ORDER — NITROFURANTOIN MONOHYD MACRO 100 MG PO CAPS: 100.0000 mg | ORAL_CAPSULE | Freq: Two times a day (BID) | ORAL | 0 refills | Status: AC

## 2018-10-24 MED ORDER — IOPAMIDOL (ISOVUE-300) INJECTION 61%
100.0000 mL | Freq: Once | INTRAVENOUS | Status: AC | PRN
  Administered 2018-10-24: 100 mL via INTRAVENOUS

## 2018-10-24 MED ORDER — SUCRALFATE 1 G PO TABS
1.0000 g | ORAL_TABLET | Freq: Once | ORAL | Status: AC
  Administered 2018-10-24: 1 g via ORAL
  Filled 2018-10-24: qty 1

## 2018-10-24 MED ORDER — MORPHINE SULFATE (PF) 4 MG/ML IV SOLN
4.0000 mg | Freq: Once | INTRAVENOUS | Status: AC
  Administered 2018-10-24: 4 mg via INTRAVENOUS
  Filled 2018-10-24: qty 1

## 2018-10-24 MED ORDER — NITROFURANTOIN MONOHYD MACRO 100 MG PO CAPS
100.0000 mg | ORAL_CAPSULE | Freq: Once | ORAL | Status: AC
  Administered 2018-10-24: 100 mg via ORAL
  Filled 2018-10-24: qty 1

## 2018-10-24 MED ORDER — SODIUM CHLORIDE 0.9 % IV BOLUS
1000.0000 mL | Freq: Once | INTRAVENOUS | Status: AC
  Administered 2018-10-24: 1000 mL via INTRAVENOUS

## 2018-10-24 MED ORDER — PANTOPRAZOLE SODIUM 40 MG IV SOLR
40.0000 mg | Freq: Once | INTRAVENOUS | Status: AC
  Administered 2018-10-24: 40 mg via INTRAVENOUS
  Filled 2018-10-24: qty 40

## 2018-10-24 NOTE — ED Triage Notes (Signed)
Pt comes via [facility] from home with c/o abdominal pain. EMS reports VSS.

## 2018-10-24 NOTE — ED Notes (Signed)
Pt given water ok per MD

## 2018-10-24 NOTE — ED Notes (Signed)
Report given to Georgie RN 

## 2018-10-24 NOTE — ED Notes (Signed)
Urine sample sent to lab

## 2018-10-24 NOTE — ED Provider Notes (Signed)
[facility] Emergency Department Provider Note  ____________________________________________   First MD Initiated Contact with Patient 10/24/18 1159     (approximate)  I have reviewed the triage vital signs and the nursing notes.   HISTORY  Chief Complaint Abdominal Pain   HPI Maria Warren is a 62 y.o. female with a history of COPD as well as GERD and hypothyroidism was presented emergency department today with abdominal pain, nausea and vomiting over the past 2 to 3 days.  The patient says that her vomit has become green and that her stool is black since she is been taking iron pills.  She says that she has been seen by gastroenterology over the past 2 months secondary to diarrhea whenever she eats something.  However, says that she has had negative stool studies.  The plan was for a CAT scan this coming Monday.  Says that the pain is moderate to severe and worse when she sits herself upward.  Does not report any burning with urination.   Past Medical History:  Diagnosis Date  . Anxiety   . Arthritis    knees, lower back  . COPD (chronic obstructive pulmonary disease) ([place])   . Coronary artery disease   . Depression   . GERD (gastroesophageal reflux disease)   . Headache    Pain over left eye, then vision in left eye goes black, quickly resolves.  Said Dr. Melony Overly office aware.  . Hypertension   . Hypothyroidism   . Lower extremity edema   . Thyroid disease   . Wears dentures    full upper    Patient Active Problem List   Diagnosis Date Noted  . Status post reverse arthroplasty of shoulder, left 04/26/2018  . Osteoarthritis of left knee 01/29/2018  . Adjustment disorder with mixed disturbance of emotions and conduct 08/09/2017  . Cocaine abuse ([place]) 08/09/2017  . Tobacco use disorder 02/17/2016  . Major depressive disorder, recurrent severe without psychotic features ([place]) 02/16/2016  . Cannabis use disorder, moderate, dependence ([place])  02/16/2016  . Hypertension 02/16/2016  . Hypothyroidism 02/16/2016  . Coronary artery disease 02/16/2016  . Suicidal ideation 02/16/2016    Past Surgical History:  Procedure Laterality Date  . CATARACT EXTRACTION W/PHACO Right 03/07/2017   Procedure: CATARACT EXTRACTION PHACO AND INTRAOCULAR LENS PLACEMENT ([place])  Right;  Surgeon: Eulogio Bear, MD;  Location: [place];  Service: Ophthalmology;  Laterality: Right;  . CATARACT EXTRACTION W/PHACO Left 04/11/2017   Procedure: CATARACT EXTRACTION PHACO AND INTRAOCULAR LENS PLACEMENT ([place])  left;  Surgeon: Eulogio Bear, MD;  Location: [place];  Service: Ophthalmology;  Laterality: Left;  . CESAREAN SECTION    . COLONOSCOPY N/A 11/24/2015   Procedure: COLONOSCOPY;  Surgeon: Lollie Sails, MD;  Location: [facility] ENDOSCOPY;  Service: Endoscopy;  Laterality: N/A;  . ESOPHAGOGASTRODUODENOSCOPY (EGD) WITH PROPOFOL N/A 11/24/2015   Procedure: ESOPHAGOGASTRODUODENOSCOPY (EGD) WITH PROPOFOL;  Surgeon: Lollie Sails, MD;  Location[facility] ENDOSCOPY;  Service: Endoscopy;  Laterality: N/A;  . LEFT HEART CATH AND CORONARY ANGIOGRAPHY Left 01/11/2017   Procedure: Left Heart Cath and Coronary Angiography;  Surgeon: Corey Skains, MD;  Location: [place] [facility];  Service: Cardiovascular;  Laterality: Left;  . REVERSE SHOULDER ARTHROPLASTY Left 04/26/2018   Procedure: REVERSE SHOULDER ARTHROPLASTY;  Surgeon: Corky Mull, MD;  Location: [facility] ORS;  Service: Orthopedics;  Laterality: Left;  . TOTAL KNEE ARTHROPLASTY Left 01/29/2018   Procedure: TOTAL KNEE ARTHROPLASTY;  Surgeon: Lovell Sheehan,  MD;  Location: [facility] ORS;  Service: Orthopedics;  Laterality: Left;  . TOTAL THYROIDECTOMY    . TUBAL LIGATION      Prior to Admission medications   Medication Sig Start Date End Date Taking? Authorizing Provider  acetaminophen (TYLENOL) 500 MG tablet Take 1,000 mg by mouth every 6 (six) hours as needed (for pain.).    [provider]  albuterol (PROVENTIL HFA;VENTOLIN HFA) 108 (90 BASE) MCG/ACT inhaler Inhale 2 puffs into the lungs every 4 (four) hours as needed for wheezing or shortness of breath.    [provider]  aspirin EC 325 MG EC tablet Take 1 tablet (325 mg total) by mouth daily with breakfast. Patient not taking: Reported on 04/20/2018 01/31/18   Lovell Sheehan, MD  atorvastatin (LIPITOR) 80 MG tablet Take 80 mg by mouth daily.    [provider]  b complex vitamins tablet Take 1 tablet by mouth daily.    [provider]  busPIRone (BUSPAR) 10 MG tablet Take 10 mg by mouth 3 (three) times daily. 02/09/18   [provider]  calcitRIOL (ROCALTROL) 0.25 MCG capsule Take 0.5 mcg by mouth 2 (two) times daily.    [provider]  cholecalciferol (VITAMIN D) 1000 units tablet Take 1,000 Units by mouth daily.    [provider]  diphenhydrAMINE (BENADRYL) 25 mg capsule Take 25 mg by mouth every 6 (six) hours as needed (for nose/watery eyes).    [provider]  docusate sodium (COLACE) 100 MG capsule Take 1 capsule (100 mg total) by mouth 2 (two) times daily. Patient not taking: Reported on 04/20/2018 01/31/18   Lovell Sheehan, MD  escitalopram (LEXAPRO) 20 MG tablet Take 20 mg by mouth daily.    [provider]  Fluticasone-Salmeterol (ADVAIR) 250-50 MCG/DOSE AEPB Inhale 1 puff into the lungs 2 (two) times daily as needed (for respiratory issues.).     [provider]  hydrOXYzine (ATARAX/VISTARIL) 25 MG tablet Take 25 mg by mouth 3 (three) times daily as needed for anxiety.    [provider]  isosorbide mononitrate (IMDUR) 30 MG 24 hr tablet Take 30 mg by mouth daily.    [provider]  levothyroxine (SYNTHROID, LEVOTHROID) 125 MCG tablet Take 125 mcg by mouth daily before breakfast. 03/07/18   [provider]  meloxicam (MOBIC) 15 MG tablet Take 15 mg by mouth daily.    [provider]    nitrofurantoin, macrocrystal-monohydrate, (MACROBID) 100 MG capsule Take 1 capsule (100 mg total) by mouth 2 (two) times daily. 04/27/18   Lattie Corns, PA-C  nitroGLYCERIN (NITROSTAT) 0.4 MG SL tablet Place 0.4 mg under the tongue every 5 (five) minutes as needed for chest pain.    [provider]  oxyCODONE (OXY IR/ROXICODONE) 5 MG immediate release tablet Take 1-2 tablets (5-10 mg total) by mouth every 4 (four) hours as needed for moderate pain (pain score 4-6). 04/27/18   Lattie Corns, PA-C  pantoprazole (PROTONIX) 40 MG tablet Take 40 mg by mouth daily before breakfast.    [provider]  polyethylene glycol (MIRALAX / GLYCOLAX) packet Take 17 g by mouth daily as needed (for constipation.).     [provider]  tiotropium (SPIRIVA) 18 MCG inhalation capsule Place 18 mcg into inhaler and inhale daily.    [provider]  traZODone (DESYREL) 50 MG tablet Take 50 mg by mouth at bedtime. 02/09/18   [provider]  vitamin B-12 (CYANOCOBALAMIN) 500 MCG tablet  Take 1,000 mcg by mouth daily.    [provider]  vitamin E 200 UNIT capsule Take 200 Units by mouth daily.    [provider]    Allergies Topiramate; Lyrica [pregabalin]; Zoloft [sertraline hcl]; and Bupropion  Family History  Problem Relation Age of Onset  . Breast cancer Maternal Aunt   . Heart failure Mother   . Heart attack Father     Social History Social History   Tobacco Use  . Smoking status: Current Every Day Smoker    Packs/day: 1.00    Years: 25.00    Pack years: 25.00    Types: E-cigarettes, Cigarettes  . Smokeless tobacco: Never Used  Substance Use Topics  . Alcohol use: Yes    Alcohol/week: 7.0 standard drinks    Types: 7 Cans of beer per week  . Drug use: Not Currently    Types: Cocaine, Marijuana    Review of Systems  Constitutional: No fever/chills Eyes: No visual changes. ENT: No sore throat. Cardiovascular: Denies chest  pain. Respiratory: Denies shortness of breath. Gastrointestinal:  No constipation. Genitourinary: Negative for dysuria. Musculoskeletal: Negative for back pain. Skin: Negative for rash. Neurological: Negative for headaches, focal weakness or numbness.   ____________________________________________   PHYSICAL EXAM:  VITAL SIGNS: ED Triage Vitals  Enc Vitals Group     BP 10/24/18 1206 (!) 178/83     Pulse Rate 10/24/18 1206 74     Resp 10/24/18 1206 18     Temp 10/24/18 1206 98.6 F (37 C)     Temp Source 10/24/18 1206 Oral     SpO2 10/24/18 1206 97 %     Weight 10/24/18 1214 170 lb (77.1 kg)     Height 10/24/18 1214 5\' 3"  (1.6 m)     Head Circumference --      Peak Flow --      Pain Score 10/24/18 1203 8     Pain Loc --      Pain Edu? --      Excl. in [place]? --     Constitutional: Alert and oriented. Well appearing and in no acute distress. Eyes: Conjunctivae are normal.  Head: Atraumatic. Nose: No congestion/rhinnorhea. Mouth/Throat: Mucous membranes are moist.  Neck: No stridor.   Cardiovascular: Normal rate, regular rhythm. Grossly normal heart sounds.  Respiratory: Normal respiratory effort.  No retractions. Lungs CTAB. Gastrointestinal: Soft with moderate upper abdominal tenderness palpation without any rebound or guarding.  Negative Murphy sign.  No distention. No CVA tenderness. Musculoskeletal: No lower extremity tenderness nor edema.  No joint effusions. Neurologic:  Normal speech and language. No gross focal neurologic deficits are appreciated. Skin:  Skin is warm, dry and intact. No rash noted. Psychiatric: Mood and affect are normal. Speech and behavior are normal.  ____________________________________________   LABS (all labs ordered are listed, but only abnormal results are displayed)  Labs Reviewed  CBC WITH DIFFERENTIAL/PLATELET - Abnormal; Notable for the following components:      Result Value   WBC 16.8 (*)    RBC 3.48 (*)    Hemoglobin 10.1  (*)    HCT 31.9 (*)    RDW 17.2 (*)    Neutro Abs 13.2 (*)    Monocytes Absolute 1.1 (*)    Abs Immature Granulocytes 0.15 (*)    All other components within normal limits  COMPREHENSIVE METABOLIC PANEL - Abnormal; Notable for the following components:   Glucose, Bld 117 (*)    All other components within normal limits  URINALYSIS, COMPLETE (UACMP) WITH MICROSCOPIC - Abnormal; Notable for the following components:   Color, Urine YELLOW (*)    APPearance CLEAR (*)    Specific Gravity, Urine 1.035 (*)    Nitrite POSITIVE (*)    Bacteria, UA RARE (*)    All other components within normal limits  URINE CULTURE  LIPASE, BLOOD  TROPONIN I   ____________________________________________  EKG  ED ECG REPORT I, Doran Stabler, the attending physician, personally viewed and interpreted this ECG.   Date: 10/24/2018  EKG Time: 1209  Rate: 74  Rhythm: normal sinus rhythm  Axis: Normal  Intervals:none  ST&T Change: No ST segment elevation or depression.  T wave inversions in V2 and V3. No significant change from previous. ____________________________________________  RADIOLOGY  CT abdomen without acute findings.  Diverticulosis without diverticulitis.  Atherosclerotic calcification of the abdominal aorta. ____________________________________________   PROCEDURES  Procedure(s) performed:   Procedures  Critical Care performed:   ____________________________________________   INITIAL IMPRESSION / ASSESSMENT AND PLAN / ED COURSE  Pertinent labs & imaging results that were available during my care of the patient were reviewed by me and considered in my medical decision making (see chart for details).  Differential diagnosis includes, but is not limited to, biliary disease (biliary colic, acute cholecystitis, cholangitis, choledocholithiasis, etc), intrathoracic causes for epigastric abdominal pain including ACS, gastritis, duodenitis, pancreatitis, small bowel or large  bowel obstruction, abdominal aortic aneurysm, hernia, and ulcer(s). As part of my medical decision making, I reviewed the following data within the electronic MEDICAL RECORD NUMBER Notes from prior ED visits  ----------------------------------------- 3:47 PM on 10/24/2018 -----------------------------------------  Patient feels improved at this time.  Tolerating fluids.  I reexamined her abdomen and she is soft and nontender throughout.  Urine positive for bacteria as well as nitrite.  Will treat for UTI as well as discharge on sucralfate which she says is helped her in the past.  Patient will follow-up with gastroenterology.  She is understanding of the diagnosis well treatment and willing to comply. ____________________________________________   FINAL CLINICAL IMPRESSION(S) / ED DIAGNOSES  UTI.  Abdominal pain with nausea and vomiting.  NEW MEDICATIONS STARTED DURING THIS VISIT:  New Prescriptions   No medications on file     Note:  This document was prepared using Dragon voice recognition software and may include unintentional dictation errors.     Orbie Pyo, MD 10/24/18 [phone]

## 2018-10-24 NOTE — ED Notes (Signed)
Pt comfortably sitting in bed resting.

## 2018-10-24 NOTE — ED Notes (Signed)
Patient transported to CT 

## 2018-10-27 LAB — URINE CULTURE: Culture: >=100000 — AB

## 2018-10-29 ENCOUNTER — Ambulatory Visit: Payer: Medicare Other

## 2018-11-13 DIAGNOSIS — R9431 Abnormal electrocardiogram [ECG] [EKG]: Secondary | ICD-10-CM | POA: Insufficient documentation

## 2018-11-13 DIAGNOSIS — I6523 Occlusion and stenosis of bilateral carotid arteries: Secondary | ICD-10-CM | POA: Insufficient documentation

## 2018-11-22 ENCOUNTER — Other Ambulatory Visit: Payer: Self-pay | Admitting: Family Medicine

## 2018-11-22 DIAGNOSIS — Z1231 Encounter for screening mammogram for malignant neoplasm of breast: Secondary | ICD-10-CM

## 2018-12-17 ENCOUNTER — Ambulatory Visit
Admission: RE | Admit: 2018-12-17 | Discharge: 2018-12-17 | Disposition: A | Discharge status: Home / Self Care | Payer: Medicare Other | Source: Ambulatory Visit | Attending: Family Medicine | Admitting: Family Medicine

## 2018-12-17 DIAGNOSIS — Z1231 Encounter for screening mammogram for malignant neoplasm of breast: Secondary | ICD-10-CM | POA: Diagnosis present

## 2018-12-25 ENCOUNTER — Other Ambulatory Visit: Payer: Self-pay | Admitting: Family Medicine

## 2018-12-25 DIAGNOSIS — R928 Other abnormal and inconclusive findings on diagnostic imaging of breast: Secondary | ICD-10-CM

## 2018-12-25 DIAGNOSIS — N6320 Unspecified lump in the left breast, unspecified quadrant: Secondary | ICD-10-CM

## 2018-12-28 ENCOUNTER — Ambulatory Visit
Admission: RE | Admit: 2018-12-28 | Discharge: 2018-12-28 | Disposition: A | Discharge status: Home / Self Care | Payer: Medicare Other | Source: Ambulatory Visit | Attending: Family Medicine | Admitting: Family Medicine

## 2018-12-28 DIAGNOSIS — N6320 Unspecified lump in the left breast, unspecified quadrant: Secondary | ICD-10-CM

## 2018-12-28 DIAGNOSIS — R928 Other abnormal and inconclusive findings on diagnostic imaging of breast: Secondary | ICD-10-CM

## 2019-01-21 ENCOUNTER — Other Ambulatory Visit (INDEPENDENT_AMBULATORY_CARE_PROVIDER_SITE_OTHER): Payer: Self-pay | Admitting: Vascular Surgery

## 2019-01-21 DIAGNOSIS — I6529 Occlusion and stenosis of unspecified carotid artery: Secondary | ICD-10-CM

## 2019-01-23 ENCOUNTER — Encounter (INDEPENDENT_AMBULATORY_CARE_PROVIDER_SITE_OTHER): Payer: Self-pay

## 2019-01-25 ENCOUNTER — Ambulatory Visit (INDEPENDENT_AMBULATORY_CARE_PROVIDER_SITE_OTHER): Payer: Medicare Other | Admitting: Vascular Surgery

## 2019-01-25 ENCOUNTER — Ambulatory Visit (INDEPENDENT_AMBULATORY_CARE_PROVIDER_SITE_OTHER): Payer: Medicare Other

## 2019-01-25 ENCOUNTER — Other Ambulatory Visit: Payer: Self-pay

## 2019-01-25 ENCOUNTER — Encounter (INDEPENDENT_AMBULATORY_CARE_PROVIDER_SITE_OTHER): Payer: Self-pay | Admitting: Vascular Surgery

## 2019-01-25 VITALS — BP 167/84 | HR 88 | Resp 16 | Ht 63.0 in | Wt 179.4 lb

## 2019-01-25 DIAGNOSIS — I2510 Atherosclerotic heart disease of native coronary artery without angina pectoris: Secondary | ICD-10-CM

## 2019-01-25 DIAGNOSIS — F17210 Nicotine dependence, cigarettes, uncomplicated: Secondary | ICD-10-CM

## 2019-01-25 DIAGNOSIS — I6523 Occlusion and stenosis of bilateral carotid arteries: Secondary | ICD-10-CM | POA: Diagnosis not present

## 2019-01-25 DIAGNOSIS — I6529 Occlusion and stenosis of unspecified carotid artery: Secondary | ICD-10-CM | POA: Insufficient documentation

## 2019-01-25 DIAGNOSIS — I10 Essential (primary) hypertension: Secondary | ICD-10-CM

## 2019-01-25 DIAGNOSIS — Z79899 Other long term (current) drug therapy: Secondary | ICD-10-CM

## 2019-01-25 DIAGNOSIS — F17200 Nicotine dependence, unspecified, uncomplicated: Secondary | ICD-10-CM

## 2019-01-25 NOTE — Assessment & Plan Note (Signed)

## 2019-01-25 NOTE — Assessment & Plan Note (Signed)
Continue cardiac and antihypertensive medications as already ordered and reviewed, no changes at this time. Continue statin as ordered and reviewed, no changes at this time Nitrates PRN for chest pain  

## 2019-01-25 NOTE — Assessment & Plan Note (Signed)
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  She had velocities consistent with a low end of the 60 to 79% stenosis range in the right carotid artery although the velocities may be somewhat elevated due to compensatory flow. At this level, there is no clear role for intervention.  Continued close follow-up will be recommended and I will see her back in 4 to 6 months with duplex.  She will continue statin therapy and antiplatelet therapy as well.  We have had a long discussion about the natural history and pathophysiology of carotid disease today.

## 2019-01-25 NOTE — Progress Notes (Signed)
Patient ID: Maria Warren, female   DOB: November 13, 1955, 63 y.o.   MRN: [id number]  Chief Complaint  Patient presents with  . New Patient (Initial Visit)    ref Maria Warren for carotid    HPI ANDJELA WICKES is a 63 y.o. female.  I am asked to see the patient by Dr. Nehemiah Warren for evaluation of carotid stenosis.  The patient reports some occasional dizziness but no other focal neurologic symptoms of note.  She has a long list of medical issues as listed below and continues to smoke.  She reports no arm or leg weakness or numbness, speech or swallowing difficulty, or temporary monocular blindness.  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  She had velocities consistent with a low end of the 60 to 79% stenosis range in the right carotid artery although the velocities may be somewhat elevated due to compensatory flow.   Past Medical History:  Diagnosis Date  . Anxiety   . Arthritis    knees, lower back  . COPD (chronic obstructive pulmonary disease) ([place])   . Coronary artery disease   . Depression   . GERD (gastroesophageal reflux disease)   . Headache    Pain over left eye, then vision in left eye goes black, quickly resolves.  Said Dr. Melony Overly office aware.  . Hypertension   . Hypothyroidism   . Lower extremity edema   . Thyroid disease   . Wears dentures    full upper    Past Surgical History:  Procedure Laterality Date  . CATARACT EXTRACTION W/PHACO Right 03/07/2017   Procedure: CATARACT EXTRACTION PHACO AND INTRAOCULAR LENS PLACEMENT ([place])  Right;  Surgeon: Maria Bear, MD;  Location: [place];  Service: Ophthalmology;  Laterality: Right;  . CATARACT EXTRACTION W/PHACO Left 04/11/2017   Procedure: CATARACT EXTRACTION PHACO AND INTRAOCULAR LENS PLACEMENT ([place])  left;  Surgeon: Maria Bear, MD;  Location: [place];  Service: Ophthalmology;  Laterality: Left;  . CESAREAN SECTION    . COLONOSCOPY N/A 11/24/2015   Procedure:  COLONOSCOPY;  Surgeon: Lollie Sails, MD;  Location: [facility] ENDOSCOPY;  Service: Endoscopy;  Laterality: N/A;  . ESOPHAGOGASTRODUODENOSCOPY (EGD) WITH PROPOFOL N/A 11/24/2015   Procedure: ESOPHAGOGASTRODUODENOSCOPY (EGD) WITH PROPOFOL;  Surgeon: Lollie Sails, MD;  Location: [facility] ENDOSCOPY;  Service: Endoscopy;  Laterality: N/A;  . LEFT HEART CATH AND CORONARY ANGIOGRAPHY Left 01/11/2017   Procedure: Left Heart Cath and Coronary Angiography;  Surgeon: Corey Skains, MD;  Location: [place] [facility];  Service: Cardiovascular;  Laterality: Left;  . REVERSE SHOULDER ARTHROPLASTY Left 04/26/2018   Procedure: REVERSE SHOULDER ARTHROPLASTY;  Surgeon: Corky Mull, MD;  Location: [facility];  Service: Orthopedics;  Laterality: Left;  . TOTAL KNEE ARTHROPLASTY Left 01/29/2018   Procedure: TOTAL KNEE ARTHROPLASTY;  Surgeon: Lovell Sheehan, MD;  Location: [facility] ORS;  Service: Orthopedics;  Laterality: Left;  . TOTAL THYROIDECTOMY    . TUBAL LIGATION      Family History  Problem Relation Age of Onset  . Breast cancer Maternal Aunt   . Heart failure Mother   . Heart attack Father   no bleeding or clotting disorders  Social History Social History   Tobacco Use  . Smoking status: Current Every Day Smoker    Packs/day: 1.00    Years: 25.00    Pack years: 25.00    Types: E-cigarettes, Cigarettes  . Smokeless tobacco: Never Used  Substance Use Topics  .  Alcohol use: Yes    Alcohol/week: 7.0 standard drinks    Types: 7 Cans of beer per week  . Drug use: Not Currently    Types: Cocaine, Marijuana  History of polysubstance abuse  Allergies  Allergen Reactions  . Topiramate Nausea Only and Anxiety  . Lyrica [Pregabalin] Nausea Only  . Zoloft [Sertraline Hcl] Nausea Only  . Bupropion Nausea Only    Light headed Light headed    Current Outpatient Medications  Medication Sig Dispense Refill  . acetaminophen (TYLENOL) 500 MG tablet Take 1,000 mg by mouth every 6 (six) hours as needed  (for pain.).    Marland Kitchen albuterol (PROVENTIL HFA;VENTOLIN HFA) 108 (90 BASE) MCG/ACT inhaler Inhale 2 puffs into the lungs every 4 (four) hours as needed for wheezing or shortness of breath.    Marland Kitchen atorvastatin (LIPITOR) 80 MG tablet Take 80 mg by mouth daily.    Marland Kitchen b complex vitamins tablet Take 1 tablet by mouth daily.    . busPIRone (BUSPAR) 10 MG tablet Take 10 mg by mouth 3 (three) times daily.    . calcitRIOL (ROCALTROL) 0.25 MCG capsule Take 0.5 mcg by mouth 2 (two) times daily.    . cholecalciferol (VITAMIN D) 1000 units tablet Take 1,000 Units by mouth daily.    . diphenhydrAMINE (BENADRYL) 25 mg capsule Take 25 mg by mouth every 6 (six) hours as needed (for nose/watery eyes).    Marland Kitchen escitalopram (LEXAPRO) 20 MG tablet Take 20 mg by mouth daily.    . Fluticasone-Salmeterol (ADVAIR) 250-50 MCG/DOSE AEPB Inhale 1 puff into the lungs 2 (two) times daily as needed (for respiratory issues.).     Marland Kitchen hydrOXYzine (ATARAX/VISTARIL) 25 MG tablet Take 25 mg by mouth 3 (three) times daily as needed for anxiety.    . isosorbide mononitrate (IMDUR) 30 MG 24 hr tablet Take 30 mg by mouth daily.    Marland Kitchen levothyroxine (SYNTHROID, LEVOTHROID) 125 MCG tablet Take 125 mcg by mouth daily before breakfast.    . meloxicam (MOBIC) 15 MG tablet Take 15 mg by mouth daily.    . nitroGLYCERIN (NITROSTAT) 0.4 MG SL tablet Place 0.4 mg under the tongue every 5 (five) minutes as needed for chest pain.    . pantoprazole (PROTONIX) 40 MG tablet Take 40 mg by mouth daily before breakfast.    . polyethylene glycol (MIRALAX / GLYCOLAX) packet Take 17 g by mouth daily as needed (for constipation.).     Marland Kitchen sucralfate (CARAFATE) 1 g tablet Take 1 tablet (1 g total) by mouth 4 (four) times daily. 60 tablet 0  . tiotropium (SPIRIVA) 18 MCG inhalation capsule Place 18 mcg into inhaler and inhale daily.    . traZODone (DESYREL) 50 MG tablet Take 50 mg by mouth at bedtime.    . vitamin B-12 (CYANOCOBALAMIN) 500 MCG tablet Take 1,000 mcg by mouth  daily.    . vitamin E 200 UNIT capsule Take 200 Units by mouth daily.    Marland Kitchen aspirin EC 325 MG EC tablet Take 1 tablet (325 mg total) by mouth daily with breakfast. (Patient not taking: Reported on 04/20/2018) 30 tablet 0  . docusate sodium (COLACE) 100 MG capsule Take 1 capsule (100 mg total) by mouth 2 (two) times daily. (Patient not taking: Reported on 04/20/2018) 10 capsule 0  . oxyCODONE (OXY IR/ROXICODONE) 5 MG immediate release tablet Take 1-2 tablets (5-10 mg total) by mouth every 4 (four) hours as needed for moderate pain (pain score 4-6). (Patient not taking: Reported on 01/25/2019)  60 tablet 0   No current facility-administered medications for this visit.       REVIEW OF SYSTEMS (Negative unless checked)  Constitutional: [] Weight loss  [] Fever  [] Chills Cardiac: [] Chest pain   [] Chest pressure   [x] Palpitations   [] Shortness of breath when laying flat   [] Shortness of breath at rest   [x] Shortness of breath with exertion. Vascular:  [] Pain in legs with walking   [] Pain in legs at rest   [] Pain in legs when laying flat   [] Claudication   [] Pain in feet when walking  [] Pain in feet at rest  [] Pain in feet when laying flat   [] History of DVT   [] Phlebitis   [] Swelling in legs   [] Varicose veins   [] Non-healing ulcers Pulmonary:   [] Uses home oxygen   [] Productive cough   [] Hemoptysis   [] Wheeze  [] COPD   [] Asthma Neurologic:  [] Dizziness  [] Blackouts   [] Seizures   [] History of stroke   [] History of TIA  [] Aphasia   [] Temporary blindness   [] Dysphagia   [] Weakness or numbness in arms   [] Weakness or numbness in legs Musculoskeletal:  [x] Arthritis   [] Joint swelling   [x] Joint pain   [] Low back pain Hematologic:  [] Easy bruising  [] Easy bleeding   [] Hypercoagulable state   [] Anemic  [] Hepatitis Gastrointestinal:  [] Blood in stool   [] Vomiting blood  [] Gastroesophageal reflux/heartburn   [] Abdominal pain Genitourinary:  [] Chronic kidney disease   [] Difficult urination  [] Frequent urination   [] Burning with urination   [] Hematuria Skin:  [] Rashes   [] Ulcers   [] Wounds Psychological:  [x] History of anxiety   [x]  History of major depression.    Physical Exam BP (!) 167/84 (BP Location: Right Arm)   Pulse 88   Resp 16   Ht 5\' 3"  (1.6 m)   Wt 179 lb 6.4 oz (81.4 kg)   BMI 31.78 kg/m  Gen:  WD/WN, NAD. Appears older than stated age. Head: [place]/AT, No temporalis wasting.  Ear/Nose/Throat: Hearing grossly intact, nares w/o erythema or drainage, oropharynx w/o Erythema/Exudate Eyes: Conjunctiva clear, sclera non-icteric  Neck: trachea midline.  Bilateral bruits present Pulmonary:  Good air movement, clear to auscultation bilaterally.  Cardiac: RRR, normal S1, S2 Vascular:  Vessel Right Left  Radial Palpable Palpable                                   Gastrointestinal: soft, non-tender/non-distended.  Musculoskeletal: M/S 5/5 throughout.  Extremities without ischemic changes.  No deformity or atrophy. No edema. Neurologic: Sensation grossly intact in extremities.  Symmetrical.  Speech is fluent. Motor exam as listed above. Psychiatric: Judgment intact, Mood & affect appropriate for pt's clinical situation. Dermatologic: No rashes or ulcers noted.  No cellulitis or open wounds.   Radiology Mm Diag Breast Tomo Uni Left  Result Date: 12/28/2018 CLINICAL DATA:  The patient was called back for possible left breast mass. EXAM: DIGITAL DIAGNOSTIC UNILATERAL LEFT MAMMOGRAM WITH CAD AND TOMO COMPARISON:  Previous exam(s). ACR Breast Density Category b: There are scattered areas of fibroglandular density. FINDINGS: Possible left breast mass resolves on additional imaging. No suspicious findings seen in the left breast. Mammographic images were processed with CAD. IMPRESSION: No mammographic evidence of malignancy in the left breast. RECOMMENDATION: Annual screening mammography. I have discussed the findings and recommendations with the patient. Results were also provided in writing  at the conclusion of the visit. If applicable, a reminder letter will be  sent to the patient regarding the next appointment. BI-RADS CATEGORY  2: Benign. Electronically Signed   By: Dorise Bullion III M.D   On: 12/28/2018 13:56    Labs No results found for this or any previous visit (from the past 2160 hour(s)).  Assessment/Plan:  Hypertension blood pressure control important in reducing the progression of atherosclerotic disease. On appropriate oral medications.   Coronary artery disease Continue cardiac and antihypertensive medications as already ordered and reviewed, no changes at this time.  Continue statin as ordered and reviewed, no changes at this time  Nitrates PRN for chest pain   Tobacco use disorder We had a discussion for approximately 3 minutes regarding the absolute need for smoking cessation due to the deleterious nature of tobacco on the vascular system. We discussed the tobacco use would diminish patency of any intervention, and likely significantly worsen progressio of disease. We discussed multiple agents for quitting including replacement therapy or medications to reduce cravings such as Chantix. The patient voices their understanding of the importance of smoking cessation.  Carotid stenosis A carotid duplex was done today which demonstrated a left ICA occlusion.  She had velocities consistent with a low end of the 60 to 79% stenosis range in the right carotid artery although the velocities may be somewhat elevated due to compensatory flow. At this level, there is no clear role for intervention.  Continued close follow-up will be recommended and I will see her back in 4 to 6 months with duplex.  She will continue statin therapy and antiplatelet therapy as well.  We have had a long discussion about the natural history and pathophysiology of carotid disease today.      Leotis Pain 01/25/2019, 10:19 AM   This note was created with Dragon medical transcription system.   Any errors from dictation are unintentional.

## 2019-01-25 NOTE — Patient Instructions (Signed)
Carotid Artery Disease  The carotid arteries are arteries on both sides of the neck. They carry blood to the brain, face, and neck. Carotid artery disease happens when these arteries become smaller (narrow) or get blocked. If these arteries become smaller or get blocked, you are more likely to have a stroke or a warning stroke (transient ischemic attack). Follow these instructions at home:  Take over-the-counter and prescription medicines only as told by your doctor.  Make sure you understand all instructions about your medicines. Do not stop taking your medicines without talking to your doctor first.  Follow your doctor's diet instructions. It is important to follow a healthy diet. ? Eat foods that include plenty of: ? Fresh fruits. ? Vegetables. ? Lean meats. ? Avoid these foods: ? Foods that are high in fat. ? Foods that are high in salt (sodium). ? Foods that are fried. ? Foods that are processed. ? Foods that have few good nutrients (poor nutritional value).  Keep a healthy weight.  Stay active. Get at least 30 minutes of activity every day.  Do not smoke.  Limit alcohol use to: ? No more than 2 drinks a day for men. ? No more than 1 drink a day for women who are not pregnant.  Do not use illegal drugs.  Keep all follow-up visits as told by your doctor. This is important. Contact a doctor if: Get help right away if:  You have any symptoms of stroke or TIA. The acronym BEFAST is an easy way to remember the main warning signs of stroke. ? B = Balance problems. Signs include dizziness, sudden trouble walking, or loss of balance ? E = Eye problems. This includes trouble seeing or a sudden change in vision. ? F = Face changes. This includes sudden weakness or numbness of the face, or the face or eyelid drooping to one side. ? A = Arm weakness or numbness. This happens suddenly and usually on one side of the body. ? S = Speech problems. This includes trouble speaking or  trouble understanding. ? T = Time. Time to call 911 or seek emergency care. Do not wait to see if symptoms go away. Make note of the time your symptoms started.  Other signs of stroke may include: ? A sudden, severe headache with no known cause. ? Feeling sick to your stomach (nauseous) or throwing up (vomiting). ? Seizure. Call your local emergency services (911 in [place] Do notdrive yourself to the clinic or hospital. Summary  The carotid arteries are arteries on both sides of the neck.  If these arteries get smaller or get blocked, you are more likely to have a stroke or a warning stroke (transient ischemic attack).  Take over-the-counter and prescription medicines only as told by your doctor.  Keep all follow-up visits as told by your doctor. This is important. This information is not intended to replace advice given to you by your health care provider. Make sure you discuss any questions you have with your health care provider. Document Released: 10/03/2012 Document Revised: 10/12/2017 Document Reviewed: 10/12/2017 Elsevier Interactive Patient Education  2019 [employer]  

## 2019-01-25 NOTE — Assessment & Plan Note (Signed)
blood pressure control important in reducing the progression of atherosclerotic disease. On appropriate oral medications.  

## 2019-05-31 ENCOUNTER — Encounter (INDEPENDENT_AMBULATORY_CARE_PROVIDER_SITE_OTHER): Payer: Self-pay | Admitting: Vascular Surgery

## 2019-05-31 ENCOUNTER — Other Ambulatory Visit: Payer: Self-pay

## 2019-05-31 ENCOUNTER — Ambulatory Visit (INDEPENDENT_AMBULATORY_CARE_PROVIDER_SITE_OTHER): Payer: Medicare Other | Admitting: Vascular Surgery

## 2019-05-31 ENCOUNTER — Ambulatory Visit (INDEPENDENT_AMBULATORY_CARE_PROVIDER_SITE_OTHER): Payer: Medicare Other

## 2019-05-31 ENCOUNTER — Encounter (INDEPENDENT_AMBULATORY_CARE_PROVIDER_SITE_OTHER): Payer: Self-pay

## 2019-05-31 VITALS — BP 164/80 | HR 70 | Resp 16 | Wt 182.8 lb

## 2019-05-31 DIAGNOSIS — I10 Essential (primary) hypertension: Secondary | ICD-10-CM | POA: Diagnosis not present

## 2019-05-31 DIAGNOSIS — I2510 Atherosclerotic heart disease of native coronary artery without angina pectoris: Secondary | ICD-10-CM | POA: Diagnosis not present

## 2019-05-31 DIAGNOSIS — I6523 Occlusion and stenosis of bilateral carotid arteries: Secondary | ICD-10-CM

## 2019-05-31 NOTE — Progress Notes (Signed)
MRN : [id number]  Maria Warren is a 63 y.o. ([birth year]) female who presents with chief complaint of  Chief Complaint  Patient presents with  . Follow-up    ultrasound follow up  .  History of Present Illness: Patient returns in follow-up of her carotid disease.  She is doing well today without any focal neurologic symptoms.  No new complaints from her last visit earlier this year.  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  Her velocities fall in the 60 to 79% range on the right although these velocities may be somewhat falsely elevated due to compensatory flow.  Current Outpatient Medications  Medication Sig Dispense Refill  . acetaminophen (TYLENOL) 500 MG tablet Take 1,000 mg by mouth every 6 (six) hours as needed (for pain.).    Marland Kitchen albuterol (PROVENTIL HFA;VENTOLIN HFA) 108 (90 BASE) MCG/ACT inhaler Inhale 2 puffs into the lungs every 4 (four) hours as needed for wheezing or shortness of breath.    Marland Kitchen aspirin EC 81 MG tablet Take 81 mg by mouth daily.    Marland Kitchen atorvastatin (LIPITOR) 80 MG tablet Take 80 mg by mouth daily.    Marland Kitchen b complex vitamins tablet Take 1 tablet by mouth daily.    . busPIRone (BUSPAR) 10 MG tablet Take 10 mg by mouth 3 (three) times daily.    . calcitRIOL (ROCALTROL) 0.25 MCG capsule Take 0.5 mcg by mouth 2 (two) times daily.    . cholecalciferol (VITAMIN D) 1000 units tablet Take 1,000 Units by mouth daily.    . diphenhydrAMINE (BENADRYL) 25 mg capsule Take 25 mg by mouth every 6 (six) hours as needed (for nose/watery eyes).    Marland Kitchen escitalopram (LEXAPRO) 20 MG tablet Take 20 mg by mouth daily.    . Fluticasone-Salmeterol (ADVAIR) 250-50 MCG/DOSE AEPB Inhale 1 puff into the lungs 2 (two) times daily as needed (for respiratory issues.).     Marland Kitchen hydrOXYzine (ATARAX/VISTARIL) 25 MG tablet Take 25 mg by mouth 3 (three) times daily as needed for anxiety.    . isosorbide mononitrate (IMDUR) 30 MG 24 hr tablet Take 30 mg by mouth daily.    Marland Kitchen levothyroxine  (SYNTHROID, LEVOTHROID) 125 MCG tablet Take 125 mcg by mouth daily before breakfast.    . meloxicam (MOBIC) 15 MG tablet Take 15 mg by mouth daily.    . nitroGLYCERIN (NITROSTAT) 0.4 MG SL tablet Place 0.4 mg under the tongue every 5 (five) minutes as needed for chest pain.    Marland Kitchen oxyCODONE (OXY IR/ROXICODONE) 5 MG immediate release tablet Take 1-2 tablets (5-10 mg total) by mouth every 4 (four) hours as needed for moderate pain (pain score 4-6). 60 tablet 0  . pantoprazole (PROTONIX) 40 MG tablet Take 40 mg by mouth daily before breakfast.    . polyethylene glycol (MIRALAX / GLYCOLAX) packet Take 17 g by mouth daily as needed (for constipation.).     Marland Kitchen sucralfate (CARAFATE) 1 g tablet Take 1 tablet (1 g total) by mouth 4 (four) times daily. 60 tablet 0  . tiotropium (SPIRIVA) 18 MCG inhalation capsule Place 18 mcg into inhaler and inhale daily.    . traZODone (DESYREL) 50 MG tablet Take 50 mg by mouth at bedtime.    . vitamin B-12 (CYANOCOBALAMIN) 500 MCG tablet Take 1,000 mcg by mouth daily.    . vitamin E 200 UNIT capsule Take 200 Units by mouth daily.    Marland Kitchen aspirin EC 325 MG EC tablet Take 1 tablet (325 mg  total) by mouth daily with breakfast. (Patient not taking: Reported on 05/31/2019) 30 tablet 0  . docusate sodium (COLACE) 100 MG capsule Take 1 capsule (100 mg total) by mouth 2 (two) times daily. (Patient not taking: Reported on 04/20/2018) 10 capsule 0   No current facility-administered medications for this visit.     Past Medical History:  Diagnosis Date  . Anxiety   . Arthritis    knees, lower back  . COPD (chronic obstructive pulmonary disease) ([place])   . Coronary artery disease   . Depression   . GERD (gastroesophageal reflux disease)   . Headache    Pain over left eye, then vision in left eye goes black, quickly resolves.  Said Dr. Melony Overly office aware.  . Hypertension   . Hypothyroidism   . Lower extremity edema   . Thyroid disease   . Wears dentures    full upper    Past  Surgical History:  Procedure Laterality Date  . CATARACT EXTRACTION W/PHACO Right 03/07/2017   Procedure: CATARACT EXTRACTION PHACO AND INTRAOCULAR LENS PLACEMENT ([place])  Right;  Surgeon: Eulogio Bear, MD;  Location: [place];  Service: Ophthalmology;  Laterality: Right;  . CATARACT EXTRACTION W/PHACO Left 04/11/2017   Procedure: CATARACT EXTRACTION PHACO AND INTRAOCULAR LENS PLACEMENT ([place])  left;  Surgeon: Eulogio Bear, MD;  Location: [place];  Service: Ophthalmology;  Laterality: Left;  . CESAREAN SECTION    . COLONOSCOPY N/A 11/24/2015   Procedure: COLONOSCOPY;  Surgeon: Lollie Sails, MD;  Location: [facility];  Service: Endoscopy;  Laterality: N/A;  . ESOPHAGOGASTRODUODENOSCOPY (EGD) WITH PROPOFOL N/A 11/24/2015   Procedure: ESOPHAGOGASTRODUODENOSCOPY (EGD) WITH PROPOFOL;  Surgeon: Lollie Sails, MD;  Location: [facility] ENDOSCOPY;  Service: Endoscopy;  Laterality: N/A;  . LEFT HEART CATH AND CORONARY ANGIOGRAPHY Left 01/11/2017   Procedure: Left Heart Cath and Coronary Angiography;  Surgeon: Corey Skains, MD;  Location: [place] [facility];  Service: Cardiovascular;  Laterality: Left;  . REVERSE SHOULDER ARTHROPLASTY Left 04/26/2018   Procedure: REVERSE SHOULDER ARTHROPLASTY;  Surgeon: Corky Mull, MD;  Location: [facility];  Service: Orthopedics;  Laterality: Left;  . TOTAL KNEE ARTHROPLASTY Left 01/29/2018   Procedure: TOTAL KNEE ARTHROPLASTY;  Surgeon: Lovell Sheehan, MD;  Location: [facility];  Service: Orthopedics;  Laterality: Left;  . TOTAL THYROIDECTOMY    . TUBAL LIGATION      Social History Social History   Tobacco Use  . Smoking status: Current Every Day Smoker    Packs/day: 1.00    Years: 25.00    Pack years: 25.00    Types: E-cigarettes, Cigarettes  . Smokeless tobacco: Never Used  Substance Use Topics  . Alcohol use: Yes    Alcohol/week: 7.0 standard drinks    Types: 7 Cans of beer per week  . Drug use: Not Currently     Types: Cocaine, Marijuana     Family History Family History  Problem Relation Age of Onset  . Breast cancer Maternal Aunt   . Heart failure Mother   . Heart attack Father     Allergies  Allergen Reactions  . Topiramate Nausea Only and Anxiety  . Lyrica [Pregabalin] Nausea Only  . Zoloft [Sertraline Hcl] Nausea Only  . Bupropion Nausea Only    Light headed Light headed    REVIEW OF SYSTEMS (Negative unless checked)  Constitutional: [] ?Weight loss  [] ?Fever  [] ?Chills Cardiac: [] ?Chest pain   [] ?Chest pressure   [x] ?Palpitations   [] ?Shortness of breath when  laying flat   [] ?Shortness of breath at rest   [x] ?Shortness of breath with exertion. Vascular:  [] ?Pain in legs with walking   [] ?Pain in legs at rest   [] ?Pain in legs when laying flat   [] ?Claudication   [] ?Pain in feet when walking  [] ?Pain in feet at rest  [] ?Pain in feet when laying flat   [] ?History of DVT   [] ?Phlebitis   [] ?Swelling in legs   [] ?Varicose veins   [] ?Non-healing ulcers Pulmonary:   [] ?Uses home oxygen   [] ?Productive cough   [] ?Hemoptysis   [] ?Wheeze  [x] ?COPD   [] ?Asthma Neurologic:  [] ?Dizziness  [] ?Blackouts   [] ?Seizures   [] ?History of stroke   [] ?History of TIA  [] ?Aphasia   [] ?Temporary blindness   [] ?Dysphagia   [] ?Weakness or numbness in arms   [] ?Weakness or numbness in legs Musculoskeletal:  [x] ?Arthritis   [] ?Joint swelling   [x] ?Joint pain   [] ?Low back pain Hematologic:  [] ?Easy bruising  [] ?Easy bleeding   [] ?Hypercoagulable state   [] ?Anemic  [] ?Hepatitis Gastrointestinal:  [] ?Blood in stool   [] ?Vomiting blood  [x] ?Gastroesophageal reflux/heartburn   [] ?Abdominal pain Genitourinary:  [] ?Chronic kidney disease   [] ?Difficult urination  [] ?Frequent urination  [] ?Burning with urination   [] ?Hematuria Skin:  [] ?Rashes   [] ?Ulcers   [] ?Wounds Psychological:  [x] ?History of anxiety   [x] ? History of major depression.    Physical Examination  Vitals:   05/31/19 1140  BP: (!) 164/80   Pulse: 70  Resp: 16  Weight: 182 lb 12.8 oz (82.9 kg)   Body mass index is 32.38 kg/m. Gen:  WD/WN, NAD Head: [place]/AT, No temporalis wasting. Ear/Nose/Throat: Hearing grossly intact, nares w/o erythema or drainage, trachea midline Eyes: Conjunctiva clear. Sclera non-icteric Neck: Supple.  Bilateral carotid bruits Pulmonary:  Good air movement, equal and clear to auscultation bilaterally.  Cardiac: RRR, No JVD Vascular:  Vessel Right Left  Radial Palpable Palpable               Musculoskeletal: M/S 5/5 throughout.  No deformity or atrophy. Trace LE edema. Neurologic: CN 2-12 intact. Sensation grossly intact in extremities.  Symmetrical.  Speech is fluent. Motor exam as listed above. Psychiatric: Judgment  And insight are fair Dermatologic: No rashes or ulcers noted.  No cellulitis or open wounds.      CBC Lab Results  Component Value Date   WBC 16.8 (H) 10/24/2018   HGB 10.1 (L) 10/24/2018   HCT 31.9 (L) 10/24/2018   MCV 91.7 10/24/2018   PLT 306 10/24/2018    BMET    Component Value Date/Time   NA 138 10/24/2018 1233   NA 140 10/08/2014 0523   K 3.7 10/24/2018 1233   K 3.5 10/08/2014 0523   CL 104 10/24/2018 1233   CL 101 10/08/2014 0523   CO2 26 10/24/2018 1233   CO2 34 (H) 10/08/2014 0523   GLUCOSE 117 (H) 10/24/2018 1233   GLUCOSE 115 (H) 10/08/2014 0523   BUN 11 10/24/2018 1233   BUN 10 10/08/2014 0523   CREATININE 0.81 10/24/2018 1233   CREATININE 1.01 10/08/2014 0523   CALCIUM 8.9 10/24/2018 1233   CALCIUM 6.5 (LL) 10/08/2014 0523   GFRNONAA >60 10/24/2018 1233   GFRNONAA 60 (L) 10/08/2014 0523   GFRNONAA >60 09/22/2013 1124   GFRAA >60 10/24/2018 1233   GFRAA >60 10/08/2014 0523   GFRAA >60 09/22/2013 1124   CrCl cannot be calculated (Patient's most recent lab result is older than the maximum 21 days allowed.).  COAG Lab  Results  Component Value Date   INR 0.87 04/20/2018   INR 0.93 01/17/2018    Radiology No results found.     Assessment/Plan Hypertension blood pressure control important in reducing the progression of atherosclerotic disease. On appropriate oral medications.   Coronary artery disease Continue cardiac and antihypertensive medications as already ordered and reviewed, no changes at this time. Continue statin as ordered and reviewed, no changes at this time Nitrates PRN for chest pain   Carotid stenosis A carotid duplex was done today which demonstrated a left ICA occlusion.  She had velocities consistent with a low end of the 60 to 79% stenosis range in the right carotid artery although the velocities may be somewhat elevated due to compensatory flow. At this level, there is no clear role for intervention.  Continued close follow-up will be recommended and I will see her back in 6 months with duplex.  She will continue statin therapy and antiplatelet therapy as well.  We have had a long discussion about the natural history and pathophysiology of carotid disease today.  Leotis Pain, MD  05/31/2019 12:36 PM    This note was created with Dragon medical transcription system.  Any errors from dictation are purely unintentional

## 2019-06-04 ENCOUNTER — Telehealth (INDEPENDENT_AMBULATORY_CARE_PROVIDER_SITE_OTHER): Payer: Self-pay | Admitting: Vascular Surgery

## 2019-06-04 NOTE — Telephone Encounter (Signed)
Spoke with the patient and gave her the recommendations from Eulogio Ditch NP. Per Arna Medici her sore throat and pain with swallowing is not related to her carotid arteries, she would need to follow up with her PCP or an ENT. When the recommendations were given the patient stated her PCP said she should see a ENT but she wanted to ask our office first.

## 2019-11-05 ENCOUNTER — Encounter (INDEPENDENT_AMBULATORY_CARE_PROVIDER_SITE_OTHER): Payer: Medicare Other

## 2019-11-05 ENCOUNTER — Ambulatory Visit (INDEPENDENT_AMBULATORY_CARE_PROVIDER_SITE_OTHER): Payer: Medicare Other | Admitting: Vascular Surgery

## 2019-12-03 ENCOUNTER — Other Ambulatory Visit: Payer: Self-pay

## 2019-12-03 ENCOUNTER — Encounter (INDEPENDENT_AMBULATORY_CARE_PROVIDER_SITE_OTHER): Payer: Self-pay | Admitting: Vascular Surgery

## 2019-12-03 ENCOUNTER — Encounter (INDEPENDENT_AMBULATORY_CARE_PROVIDER_SITE_OTHER): Payer: Self-pay

## 2019-12-03 ENCOUNTER — Ambulatory Visit (INDEPENDENT_AMBULATORY_CARE_PROVIDER_SITE_OTHER): Payer: Medicare Other | Admitting: Vascular Surgery

## 2019-12-03 ENCOUNTER — Ambulatory Visit (INDEPENDENT_AMBULATORY_CARE_PROVIDER_SITE_OTHER): Payer: Medicare Other

## 2019-12-03 VITALS — BP 166/91 | HR 78 | Resp 16 | Wt 194.0 lb

## 2019-12-03 DIAGNOSIS — I6523 Occlusion and stenosis of bilateral carotid arteries: Secondary | ICD-10-CM

## 2019-12-03 DIAGNOSIS — I10 Essential (primary) hypertension: Secondary | ICD-10-CM

## 2019-12-03 DIAGNOSIS — I2510 Atherosclerotic heart disease of native coronary artery without angina pectoris: Secondary | ICD-10-CM | POA: Diagnosis not present

## 2019-12-03 NOTE — Progress Notes (Signed)
MRN : [id number]  Maria Warren is a 64 y.o. (May 23, 1956) female who presents with chief complaint of  Chief Complaint  Patient presents with  . Follow-up    ultrasound follow up  .  History of Present Illness: Patient returns in follow-up of her carotid disease.  She is doing well and has had no focal neurologic symptoms since her last visit.  Specifically, she has not had arm or leg weakness or numbness, speech or swallowing difficulty, or temporary monocular blindness.  Carotid duplex today shows a known occlusion of the left carotid artery.  Her velocities in the right carotid artery would be in the upper end of the 40 to 59% range which is stable to slightly improved from her previous study 6 months ago.  Current Outpatient Medications  Medication Sig Dispense Refill  . acetaminophen (TYLENOL) 500 MG tablet Take 1,000 mg by mouth every 6 (six) hours as needed (for pain.).    Marland Kitchen albuterol (PROVENTIL HFA;VENTOLIN HFA) 108 (90 BASE) MCG/ACT inhaler Inhale 2 puffs into the lungs every 4 (four) hours as needed for wheezing or shortness of breath.    Marland Kitchen aspirin EC 81 MG tablet Take 81 mg by mouth daily.    Marland Kitchen atorvastatin (LIPITOR) 80 MG tablet Take 80 mg by mouth daily.    Marland Kitchen b complex vitamins tablet Take 1 tablet by mouth daily.    . busPIRone (BUSPAR) 10 MG tablet Take 10 mg by mouth 3 (three) times daily.    . calcitRIOL (ROCALTROL) 0.25 MCG capsule Take 0.5 mcg by mouth 2 (two) times daily.    . cholecalciferol (VITAMIN D) 1000 units tablet Take 1,000 Units by mouth daily.    . diphenhydrAMINE (BENADRYL) 25 mg capsule Take 25 mg by mouth every 6 (six) hours as needed (for nose/watery eyes).    Marland Kitchen escitalopram (LEXAPRO) 20 MG tablet Take 20 mg by mouth daily.    . Fluticasone-Salmeterol (ADVAIR) 250-50 MCG/DOSE AEPB Inhale 1 puff into the lungs 2 (two) times daily as needed (for respiratory issues.).     Marland Kitchen hydrOXYzine (ATARAX/VISTARIL) 25 MG tablet Take 25 mg by mouth 3 (three) times daily  as needed for anxiety.    . isosorbide mononitrate (IMDUR) 30 MG 24 hr tablet Take 30 mg by mouth daily.    Marland Kitchen levothyroxine (SYNTHROID, LEVOTHROID) 125 MCG tablet Take 125 mcg by mouth daily before breakfast.    . meloxicam (MOBIC) 15 MG tablet Take 15 mg by mouth daily.    . nitroGLYCERIN (NITROSTAT) 0.4 MG SL tablet Place 0.4 mg under the tongue every 5 (five) minutes as needed for chest pain.    Marland Kitchen oxyCODONE (OXY IR/ROXICODONE) 5 MG immediate release tablet Take 1-2 tablets (5-10 mg total) by mouth every 4 (four) hours as needed for moderate pain (pain score 4-6). 60 tablet 0  . pantoprazole (PROTONIX) 40 MG tablet Take 40 mg by mouth daily before breakfast.    . polyethylene glycol (MIRALAX / GLYCOLAX) packet Take 17 g by mouth daily as needed (for constipation.).     Marland Kitchen tiotropium (SPIRIVA) 18 MCG inhalation capsule Place 18 mcg into inhaler and inhale daily.    . traZODone (DESYREL) 50 MG tablet Take 50 mg by mouth at bedtime.    . vitamin B-12 (CYANOCOBALAMIN) 500 MCG tablet Take 1,000 mcg by mouth daily.    . vitamin E 200 UNIT capsule Take 200 Units by mouth daily.    Marland Kitchen aspirin EC 325 MG EC tablet Take 1  tablet (325 mg total) by mouth daily with breakfast. (Patient not taking: Reported on 05/31/2019) 30 tablet 0  . docusate sodium (COLACE) 100 MG capsule Take 1 capsule (100 mg total) by mouth 2 (two) times daily. (Patient not taking: Reported on 04/20/2018) 10 capsule 0  . sucralfate (CARAFATE) 1 g tablet Take 1 tablet (1 g total) by mouth 4 (four) times daily. 60 tablet 0   No current facility-administered medications for this visit.    Past Medical History:  Diagnosis Date  . Anxiety   . Arthritis    knees, lower back  . COPD (chronic obstructive pulmonary disease) ([place])   . Coronary artery disease   . Depression   . GERD (gastroesophageal reflux disease)   . Headache    Pain over left eye, then vision in left eye goes black, quickly resolves.  Said Dr. Melony Overly office aware.  .  Hypertension   . Hypothyroidism   . Lower extremity edema   . Thyroid disease   . Wears dentures    full upper    Past Surgical History:  Procedure Laterality Date  . CATARACT EXTRACTION W/PHACO Right 03/07/2017   Procedure: CATARACT EXTRACTION PHACO AND INTRAOCULAR LENS PLACEMENT ([place])  Right;  Surgeon: Eulogio Bear, MD;  Location: [place];  Service: Ophthalmology;  Laterality: Right;  . CATARACT EXTRACTION W/PHACO Left 04/11/2017   Procedure: CATARACT EXTRACTION PHACO AND INTRAOCULAR LENS PLACEMENT ([place])  left;  Surgeon: Eulogio Bear, MD;  Location: [place];  Service: Ophthalmology;  Laterality: Left;  . CESAREAN SECTION    . COLONOSCOPY N/A 11/24/2015   Procedure: COLONOSCOPY;  Surgeon: Lollie Sails, MD;  Location: [facility];  Service: Endoscopy;  Laterality: N/A;  . ESOPHAGOGASTRODUODENOSCOPY (EGD) WITH PROPOFOL N/A 11/24/2015   Procedure: ESOPHAGOGASTRODUODENOSCOPY (EGD) WITH PROPOFOL;  Surgeon: Lollie Sails, MD;  Location: [facility] ENDOSCOPY;  Service: Endoscopy;  Laterality: N/A;  . LEFT HEART CATH AND CORONARY ANGIOGRAPHY Left 01/11/2017   Procedure: Left Heart Cath and Coronary Angiography;  Surgeon: Corey Skains, MD;  Location: [place] [facility];  Service: Cardiovascular;  Laterality: Left;  . REVERSE SHOULDER ARTHROPLASTY Left 04/26/2018   Procedure: REVERSE SHOULDER ARTHROPLASTY;  Surgeon: Corky Mull, MD;  Location: [facility];  Service: Orthopedics;  Laterality: Left;  . TOTAL KNEE ARTHROPLASTY Left 01/29/2018   Procedure: TOTAL KNEE ARTHROPLASTY;  Surgeon: Lovell Sheehan, MD;  Location: [facility];  Service: Orthopedics;  Laterality: Left;  . TOTAL THYROIDECTOMY    . TUBAL LIGATION       Social History   Tobacco Use  . Smoking status: Current Every Day Smoker    Packs/day: 1.00    Years: 25.00    Pack years: 25.00    Types: E-cigarettes, Cigarettes  . Smokeless tobacco: Never Used  Substance Use Topics  . Alcohol use:  Yes    Alcohol/week: 7.0 standard drinks    Types: 7 Cans of beer per week  . Drug use: Not Currently    Types: Cocaine, Marijuana    Family History  Problem Relation Age of Onset  . Breast cancer Maternal Aunt   . Heart failure Mother   . Heart attack Father      Allergies  Allergen Reactions  . Topiramate Nausea Only and Anxiety  . Lyrica [Pregabalin] Nausea Only  . Zoloft [Sertraline Hcl] Nausea Only  . Bupropion Nausea Only    Light headed Light headed    REVIEW OF SYSTEMS(Negative unless checked)  Constitutional: [] ??Weight loss[] ??Fever[] ??Chills  Cardiac:[] ??Chest pain[] ??Chest pressure[x] ??Palpitations [] ??Shortness of breath when laying flat [] ??Shortness of breath at rest [x] ??Shortness of breath with exertion. Vascular: [] ??Pain in legs with walking[] ??Pain in legsat rest[] ??Pain in legs when laying flat [] ??Claudication [] ??Pain in feet when walking [] ??Pain in feet at rest [] ??Pain in feet when laying flat [] ??History of DVT [] ??Phlebitis [] ??Swelling in legs [] ??Varicose veins [] ??Non-healing ulcers Pulmonary: [] ??Uses home oxygen [] ??Productive cough[] ??Hemoptysis [] ??Wheeze [x] ??COPD [] ??Asthma Neurologic: [] ??Dizziness [] ??Blackouts [] ??Seizures [] ??History of stroke [] ??History of TIA[] ??Aphasia [] ??Temporary blindness[] ??Dysphagia [] ??Weaknessor numbness in arms [] ??Weakness or numbnessin legs Musculoskeletal: [x] ??Arthritis [] ??Joint swelling [x] ??Joint pain [] ??Low back pain Hematologic:[] ??Easy bruising[] ??Easy bleeding [] ??Hypercoagulable state [] ??Anemic [] ??Hepatitis Gastrointestinal:[] ??Blood in stool[] ??Vomiting blood[x] ??Gastroesophageal reflux/heartburn[] ??Abdominal pain Genitourinary: [] ??Chronic kidney disease [] ??Difficulturination [] ??Frequenturination [] ??Burning with urination[] ??Hematuria Skin: [] ??Rashes [] ??Ulcers  [] ??Wounds Psychological: [x] ??History of anxiety[x] ??History of major depression.  Physical Examination  Vitals:   12/03/19 1435  BP: (!) 166/91  Pulse: 78  Resp: 16  Weight: 194 lb (88 kg)   Body mass index is 34.37 kg/m. Gen:  WD/WN, NAD Head: [place]/AT, No temporalis wasting. Ear/Nose/Throat: Hearing grossly intact, nares w/o erythema or drainage, trachea midline Eyes: Conjunctiva clear. Sclera non-icteric Neck: Supple.  Bilateral carotid bruit  Pulmonary:  Good air movement, equal and clear to auscultation bilaterally.  Cardiac: RRR, No JVD Vascular:  Vessel Right Left  Radial Palpable Palpable               Musculoskeletal: M/S 5/5 throughout.  No deformity or atrophy.  Trace lower extremity edema. Neurologic: CN 2-12 intact. Sensation grossly intact in extremities.  Symmetrical.  Speech is fluent. Motor exam as listed above. Psychiatric: Judgment intact, Mood & affect appropriate for pt's clinical situation. Dermatologic: No rashes or ulcers noted.  No cellulitis or open wounds.      CBC Lab Results  Component Value Date   WBC 16.8 (H) 10/24/2018   HGB 10.1 (L) 10/24/2018   HCT 31.9 (L) 10/24/2018   MCV 91.7 10/24/2018   PLT 306 10/24/2018    BMET    Component Value Date/Time   NA 138 10/24/2018 1233   NA 140 10/08/2014 0523   K 3.7 10/24/2018 1233   K 3.5 10/08/2014 0523   CL 104 10/24/2018 1233   CL 101 10/08/2014 0523   CO2 26 10/24/2018 1233   CO2 34 (H) 10/08/2014 0523   GLUCOSE 117 (H) 10/24/2018 1233   GLUCOSE 115 (H) 10/08/2014 0523   BUN 11 10/24/2018 1233   BUN 10 10/08/2014 0523   CREATININE 0.81 10/24/2018 1233   CREATININE 1.01 10/08/2014 0523   CALCIUM 8.9 10/24/2018 1233   CALCIUM 6.5 (LL) 10/08/2014 0523   GFRNONAA >60 10/24/2018 1233   GFRNONAA 60 (L) 10/08/2014 0523   GFRNONAA >60 09/22/2013 1124   GFRAA >60 10/24/2018 1233   GFRAA >60 10/08/2014 0523   GFRAA >60 09/22/2013 1124   CrCl cannot be calculated (Patient's  most recent lab result is older than the maximum 21 days allowed.).  COAG Lab Results  Component Value Date   INR 0.87 04/20/2018   INR 0.93 01/17/2018    Radiology No results found.    Assessment/Plan Hypertension blood pressure control important in reducing the progression of atherosclerotic disease. On appropriate oral medications.   Coronary artery disease Continue cardiac and antihypertensive medications as already ordered and reviewed, no changes at this time. Continue statin as ordered and reviewed, no changes at this time Nitrates PRN for chest pain  Carotid stenosis Carotid duplex today shows a known occlusion of the left carotid artery.  Her velocities  in the right carotid artery would be in the upper end of the 40 to 59% range which is stable to slightly improved from her previous study 6 months ago. No major change. Continue current medical regimen.  Recheck in 6 months.     Leotis Pain, MD  12/03/2019 3:20 PM    This note was created with Dragon medical transcription system.  Any errors from dictation are purely unintentional

## 2019-12-03 NOTE — Assessment & Plan Note (Signed)
Carotid duplex today shows a known occlusion of the left carotid artery.  Her velocities in the right carotid artery would be in the upper end of the 40 to 59% range which is stable to slightly improved from her previous study 6 months ago. No major change. Continue current medical regimen.  Recheck in 6 months.

## 2019-12-19 ENCOUNTER — Encounter: Payer: Self-pay | Admitting: Otolaryngology

## 2019-12-19 ENCOUNTER — Other Ambulatory Visit: Payer: Self-pay

## 2019-12-24 ENCOUNTER — Telehealth (INDEPENDENT_AMBULATORY_CARE_PROVIDER_SITE_OTHER): Payer: Self-pay

## 2019-12-24 NOTE — Telephone Encounter (Signed)
Nadine From Dr. Darien Ramus office called and wanted a re-fax of a medical clearance for the pt. I spoke with the Clinical lead and the Clearance is being faxed over.

## 2019-12-25 ENCOUNTER — Other Ambulatory Visit: Admission: RE | Admit: 2019-12-25 | Payer: Medicare Other | Source: Ambulatory Visit

## 2019-12-30 DIAGNOSIS — C329 Malignant neoplasm of larynx, unspecified: Secondary | ICD-10-CM

## 2019-12-30 HISTORY — DX: Malignant neoplasm of larynx, unspecified: C32.9

## 2020-01-08 ENCOUNTER — Encounter
Admission: RE | Admit: 2020-01-08 | Discharge: 2020-01-08 | Disposition: A | Discharge status: Home / Self Care | Payer: Medicare Other | Source: Ambulatory Visit | Attending: Otolaryngology | Admitting: Otolaryngology

## 2020-01-08 ENCOUNTER — Other Ambulatory Visit: Payer: Self-pay

## 2020-01-08 NOTE — Patient Instructions (Addendum)
Your procedure is scheduled on: Tuesday, March 16 Report to Day Surgery on the 2nd floor of the [facility]. To find out your arrival time, please call [phone] between 1PM - 3PM on: Monday, March 15  REMEMBER: Instructions that are not followed completely may result in serious medical risk, up to and including death; or upon the discretion of your surgeon and anesthesiologist your surgery may need to be rescheduled.  Do not eat food after midnight the night before surgery.  No gum chewing, lozengers or hard candies.  You may however, drink CLEAR liquids up to 2 hours before you are scheduled to arrive for your surgery. Do not drink anything within 2 hours of the start of your surgery.  Clear liquids include: - water  - apple juice without pulp - gatorade (not RED) - black coffee or tea (Do NOT add milk or creamers to the coffee or tea) Do NOT drink anything that is not on this list.  TAKE THESE MEDICATIONS THE MORNING OF SURGERY WITH A SIP OF WATER:  1.  Isosorbide mononitrate 2.  Levothyroxine 3.  Pantoprazole - (take one the night before and one on the morning of surgery - helps to prevent nausea after surgery.) 4.  Spiriva Inhaler  Use inhalers on the day of surgery and bring to the hospital.  Follow recommendations from Cardiologist, Pulmonologist or PCP regarding stopping Aspirin.  Stop Anti-inflammatories (NSAIDS) such as Advil, Aleve, Ibuprofen, Motrin, Naproxen, Naprosyn and Aspirin based products such as Excedrin, Goodys Powder, BC Powder. (May take Tylenol or Acetaminophen if needed.)  Stop ANY OVER THE COUNTER supplements until after surgery. (Vitamin E)  (May continue Vitamin D, Vitamin B, and multivitamin.)  No Alcohol for 24 hours before or after surgery.  No Smoking including e-cigarettes for 24 hours prior to surgery.  No chewable tobacco products for at least 6 hours prior to surgery.  No nicotine patches on the day of surgery.  On the morning of  surgery brush your teeth with toothpaste and water, you may rinse your mouth with mouthwash if you wish. Do not swallow any toothpaste or mouthwash.  Do not wear jewelry, make-up, hairpins, clips or nail polish.  Do not wear lotions, powders, or perfumes.   Do not shave 48 hours prior to surgery.   Contact lenses, hearing aids and dentures may not be worn into surgery.  Do not bring valuables to the hospital, including drivers license, insurance or credit cards.  [facility] is not responsible for any belongings or valuables.   Notify your doctor if there is any change in your medical condition (cold, fever, infection).  Wear comfortable clothing (specific to your surgery type) to the hospital.  If you are being discharged the day of surgery, you will not be allowed to drive home. You will need a responsible adult to drive you home and stay with you that night.   If you are taking public transportation, you will need to have a responsible adult with you. Please confirm with your physician that it is acceptable to use public transportation.   Please call [phone] if you have any questions about these instructions.  Visitation Policy:  Patients undergoing a surgery or procedure in a hospital may have one family member or support person with them as long as that person is not COVID-19 positive or experiencing its symptoms. That person may remain in the waiting area during the procedure. Should the patient need to stay at the hospital during part of  their recovery, the support person may visit during visiting hours; 10 am to 8 pm.

## 2020-01-10 ENCOUNTER — Other Ambulatory Visit
Admission: RE | Admit: 2020-01-10 | Discharge: 2020-01-10 | Disposition: A | Discharge status: Home / Self Care | Payer: Medicare Other | Source: Ambulatory Visit | Attending: Otolaryngology | Admitting: Otolaryngology

## 2020-01-10 DIAGNOSIS — Z01812 Encounter for preprocedural laboratory examination: Secondary | ICD-10-CM | POA: Insufficient documentation

## 2020-01-10 DIAGNOSIS — Z20822 Contact with and (suspected) exposure to covid-19: Secondary | ICD-10-CM | POA: Diagnosis not present

## 2020-01-10 LAB — CBC
HCT: 35 % — ABNORMAL LOW (ref 36.0–46.0)
Hemoglobin: 11.2 g/dL — ABNORMAL LOW (ref 12.0–15.0)
MCH: 30.4 pg (ref 26.0–34.0)
MCHC: 32 g/dL (ref 30.0–36.0)
MCV: 94.9 fL (ref 80.0–100.0)
Platelets: 276 10*3/uL (ref 150–400)
RBC: 3.69 MIL/uL — ABNORMAL LOW (ref 3.87–5.11)
RDW: 14.6 % (ref 11.5–15.5)
WBC: 10.1 10*3/uL (ref 4.0–10.5)
nRBC: 0 % (ref 0.0–0.2)

## 2020-01-10 LAB — BASIC METABOLIC PANEL
Anion gap: 8 (ref 5–15)
BUN: 13 mg/dL (ref 8–23)
CO2: 28 mmol/L (ref 22–32)
Calcium: 8.3 mg/dL — ABNORMAL LOW (ref 8.9–10.3)
Chloride: 103 mmol/L (ref 98–111)
Creatinine, Ser: 0.98 mg/dL (ref 0.44–1.00)
GFR calc Af Amer: >60 mL/min (ref >60)
GFR calc non Af Amer: >60 mL/min (ref >60)
Glucose, Bld: 132 mg/dL — ABNORMAL HIGH (ref 70–99)
Potassium: 3.6 mmol/L (ref 3.5–5.1)
Sodium: 139 mmol/L (ref 135–145)

## 2020-01-10 LAB — SARS CORONAVIRUS 2 (TAT 6-24 HRS): SARS Coronavirus 2: NEGATIVE

## 2020-01-14 ENCOUNTER — Encounter: Payer: Self-pay | Admitting: Anesthesiology

## 2020-01-14 ENCOUNTER — Encounter: Payer: Self-pay | Admitting: Otolaryngology

## 2020-01-14 ENCOUNTER — Other Ambulatory Visit: Payer: Self-pay

## 2020-01-14 ENCOUNTER — Ambulatory Visit
Admission: RE | Admit: 2020-01-14 | Discharge: 2020-01-14 | Disposition: A | Discharge status: Home / Self Care | Payer: Medicare Other | Attending: Otolaryngology | Admitting: Otolaryngology

## 2020-01-14 ENCOUNTER — Encounter
Admission: RE | Disposition: A | Discharge status: Home / Self Care | Payer: Self-pay | Source: Home / Self Care | Attending: Otolaryngology

## 2020-01-14 DIAGNOSIS — I2510 Atherosclerotic heart disease of native coronary artery without angina pectoris: Secondary | ICD-10-CM | POA: Insufficient documentation

## 2020-01-14 DIAGNOSIS — Z888 Allergy status to other drugs, medicaments and biological substances status: Secondary | ICD-10-CM | POA: Insufficient documentation

## 2020-01-14 DIAGNOSIS — R490 Dysphonia: Secondary | ICD-10-CM | POA: Diagnosis not present

## 2020-01-14 DIAGNOSIS — C321 Malignant neoplasm of supraglottis: Secondary | ICD-10-CM | POA: Insufficient documentation

## 2020-01-14 DIAGNOSIS — D491 Neoplasm of unspecified behavior of respiratory system: Secondary | ICD-10-CM | POA: Diagnosis present

## 2020-01-14 HISTORY — PX: MICROLARYNGOSCOPY: SHX5208

## 2020-01-14 HISTORY — DX: Occlusion and stenosis of unspecified carotid artery: I65.29

## 2020-01-14 SURGERY — MICROLARYNGOSCOPY
Anesthesia: General | Laterality: Left

## 2020-01-14 MED ORDER — FENTANYL CITRATE (PF) 100 MCG/2ML IJ SOLN
INTRAMUSCULAR | Status: DC | PRN
  Administered 2020-01-14: 25 ug via INTRAVENOUS
  Administered 2020-01-14: 50 ug via INTRAVENOUS

## 2020-01-14 MED ORDER — LIDOCAINE HCL (CARDIAC) PF 100 MG/5ML IV SOSY
PREFILLED_SYRINGE | INTRAVENOUS | Status: DC | PRN
  Administered 2020-01-14: 100 mg via INTRAVENOUS

## 2020-01-14 MED ORDER — ONDANSETRON HCL 4 MG/2ML IJ SOLN
INTRAMUSCULAR | Status: AC
  Filled 2020-01-14: qty 2

## 2020-01-14 MED ORDER — PHENYLEPHRINE HCL (PRESSORS) 10 MG/ML IV SOLN
INTRAVENOUS | Status: DC | PRN
  Administered 2020-01-14 (×5): 100 ug via INTRAVENOUS

## 2020-01-14 MED ORDER — MIDAZOLAM HCL 2 MG/2ML IJ SOLN
INTRAMUSCULAR | Status: DC | PRN
  Administered 2020-01-14: 2 mg via INTRAVENOUS

## 2020-01-14 MED ORDER — PROPOFOL 10 MG/ML IV BOLUS
INTRAVENOUS | Status: AC
  Filled 2020-01-14: qty 20

## 2020-01-14 MED ORDER — IPRATROPIUM-ALBUTEROL 0.5-2.5 (3) MG/3ML IN SOLN
3.0000 mL | RESPIRATORY_TRACT | Status: DC
  Administered 2020-01-14: 3 mL via RESPIRATORY_TRACT

## 2020-01-14 MED ORDER — LACTATED RINGERS IV SOLN: INTRAVENOUS | Status: DC

## 2020-01-14 MED ORDER — HYDROCODONE-ACETAMINOPHEN 5-325 MG PO TABS: 1.0000 | ORAL_TABLET | ORAL | 0 refills | Status: AC | PRN

## 2020-01-14 MED ORDER — ONDANSETRON HCL 4 MG PO TABS: 4.0000 mg | ORAL_TABLET | Freq: Three times a day (TID) | ORAL | 0 refills | Status: DC | PRN

## 2020-01-14 MED ORDER — FENTANYL CITRATE (PF) 100 MCG/2ML IJ SOLN
INTRAMUSCULAR | Status: AC
  Filled 2020-01-14: qty 2

## 2020-01-14 MED ORDER — IPRATROPIUM-ALBUTEROL 0.5-2.5 (3) MG/3ML IN SOLN
RESPIRATORY_TRACT | Status: AC
  Filled 2020-01-14: qty 3

## 2020-01-14 MED ORDER — DEXAMETHASONE SODIUM PHOSPHATE 10 MG/ML IJ SOLN
INTRAMUSCULAR | Status: DC | PRN
  Administered 2020-01-14: 10 mg via INTRAVENOUS

## 2020-01-14 MED ORDER — PROPOFOL 10 MG/ML IV BOLUS
INTRAVENOUS | Status: DC | PRN
  Administered 2020-01-14: 140 mg via INTRAVENOUS

## 2020-01-14 MED ORDER — GLYCOPYRROLATE 0.2 MG/ML IJ SOLN
INTRAMUSCULAR | Status: DC | PRN
  Administered 2020-01-14: .2 mg via INTRAVENOUS

## 2020-01-14 MED ORDER — ROCURONIUM BROMIDE 10 MG/ML (PF) SYRINGE
PREFILLED_SYRINGE | INTRAVENOUS | Status: AC
  Filled 2020-01-14: qty 10

## 2020-01-14 MED ORDER — LIDOCAINE HCL (PF) 2 % IJ SOLN
INTRAMUSCULAR | Status: AC
  Filled 2020-01-14: qty 10

## 2020-01-14 MED ORDER — ROCURONIUM BROMIDE 100 MG/10ML IV SOLN
INTRAVENOUS | Status: DC | PRN
  Administered 2020-01-14: 50 mg via INTRAVENOUS

## 2020-01-14 MED ORDER — FENTANYL CITRATE (PF) 100 MCG/2ML IJ SOLN: 25.0000 ug | INTRAMUSCULAR | Status: DC | PRN

## 2020-01-14 MED ORDER — SUGAMMADEX SODIUM 200 MG/2ML IV SOLN
INTRAVENOUS | Status: DC | PRN
  Administered 2020-01-14: 200 mg via INTRAVENOUS

## 2020-01-14 MED ORDER — LIDOCAINE HCL 4 % EX SOLN
CUTANEOUS | Status: DC | PRN
  Administered 2020-01-14: 3 mL via TOPICAL

## 2020-01-14 MED ORDER — ONDANSETRON HCL 4 MG/2ML IJ SOLN: 4.0000 mg | Freq: Once | INTRAMUSCULAR | Status: DC | PRN

## 2020-01-14 MED ORDER — OXYMETAZOLINE HCL 0.05 % NA SOLN
NASAL | Status: AC
  Filled 2020-01-14: qty 30

## 2020-01-14 MED ORDER — EPINEPHRINE PF 1 MG/ML IJ SOLN
INTRAMUSCULAR | Status: AC
  Filled 2020-01-14: qty 1

## 2020-01-14 MED ORDER — DEXAMETHASONE SODIUM PHOSPHATE 10 MG/ML IJ SOLN
INTRAMUSCULAR | Status: AC
  Filled 2020-01-14: qty 1

## 2020-01-14 MED ORDER — ONDANSETRON HCL 4 MG/2ML IJ SOLN
INTRAMUSCULAR | Status: DC | PRN
  Administered 2020-01-14: 4 mg via INTRAVENOUS

## 2020-01-14 MED ORDER — MIDAZOLAM HCL 2 MG/2ML IJ SOLN
INTRAMUSCULAR | Status: AC
  Filled 2020-01-14: qty 2

## 2020-01-14 MED ORDER — GLYCOPYRROLATE 0.2 MG/ML IJ SOLN
INTRAMUSCULAR | Status: AC
  Filled 2020-01-14: qty 1

## 2020-01-14 SURGICAL SUPPLY — 17 items
COVER BACK TABLE REUSABLE LG (DRAPES) ×3 IMPLANT
COVER MAYO STAND REUSABLE (DRAPES) ×2 IMPLANT
COVER WAND RF STERILE (DRAPES) ×3 IMPLANT
CUP MEDICINE 2OZ PLAST GRAD ST (MISCELLANEOUS) ×3 IMPLANT
DRSG TELFA 4X3 1S NADH ST (GAUZE/BANDAGES/DRESSINGS) ×3 IMPLANT
GAUZE 4X4 16PLY RFD (DISPOSABLE) ×3 IMPLANT
GLOVE BIO SURGEON STRL SZ7.5 (GLOVE) ×3 IMPLANT
GOWN STRL REUS W/ TWL LRG LVL3 (GOWN DISPOSABLE) ×2 IMPLANT
GOWN STRL REUS W/TWL LRG LVL3 (GOWN DISPOSABLE) ×4
NDL SAFETY ECLIPSE 18X1.5 (NEEDLE) ×1 IMPLANT
NEEDLE HYPO 18GX1.5 SHARP (NEEDLE) ×2
PATTIES SURGICAL .5 X.5 (GAUZE/BANDAGES/DRESSINGS) ×3 IMPLANT
SOL ANTI-FOG 6CC FOG-OUT (MISCELLANEOUS) ×1 IMPLANT
SOL FOG-OUT ANTI-FOG 6CC (MISCELLANEOUS) ×2
TOWEL OR 17X26 4PK STRL BLUE (TOWEL DISPOSABLE) ×3 IMPLANT
TUBING CONNECTING 10 (TUBING) ×2 IMPLANT
TUBING CONNECTING 10' (TUBING) ×1

## 2020-01-14 NOTE — Anesthesia Preprocedure Evaluation (Signed)
Anesthesia Evaluation  Patient identified by MRN, date of birth, ID band Patient awake    Reviewed: Allergy & Precautions, NPO status , Patient's Chart, lab work & pertinent test results  History of Anesthesia Complications Negative for: history of anesthetic complications  Airway Mallampati: III  TM Distance: >3 FB Neck ROM: Full    Dental  (+) Edentulous Upper, Edentulous Lower   Pulmonary COPD, Current Smoker and Patient abstained from smoking.,    breath sounds clear to auscultation- rhonchi (-) wheezing      Cardiovascular hypertension, Pt. on medications + CAD (medically managed)  (-) Past MI, (-) Cardiac Stents and (-) CABG  Rhythm:Regular Rate:Normal - Systolic murmurs and - Diastolic murmurs    Neuro/Psych  Headaches, neg Seizures PSYCHIATRIC DISORDERS Anxiety Depression    GI/Hepatic Neg liver ROS, GERD  ,  Endo/Other  neg diabetesHypothyroidism   Renal/GU negative Renal ROS     Musculoskeletal  (+) Arthritis ,   Abdominal (+) + obese,   Peds  Hematology negative hematology ROS (+)   Anesthesia Other Findings Past Medical History: No date: Anxiety No date: Arthritis     Comment:  knees, lower back No date: Carotid stenosis No date: COPD (chronic obstructive pulmonary disease) (HCC) No date: Coronary artery disease No date: Depression No date: GERD (gastroesophageal reflux disease) No date: Headache     Comment:  Pain over left eye, then vision in left eye goes black,               quickly resolves.  Said Dr. Melony Overly office aware. No date: Hypertension No date: Hypothyroidism No date: Lower extremity edema No date: Thyroid disease No date: Wears dentures     Comment:  full upper   Reproductive/Obstetrics                             Anesthesia Physical Anesthesia Plan  ASA: III  Anesthesia Plan: General   Post-op Pain Management:    Induction: Intravenous  PONV  Risk Score and Plan: 1 and Ondansetron and Dexamethasone  Airway Management Planned: Oral ETT  Additional Equipment:   Intra-op Plan:   Post-operative Plan: Extubation in OR  Informed Consent: I have reviewed the patients History and Physical, chart, labs and discussed the procedure including the risks, benefits and alternatives for the proposed anesthesia with the patient or authorized representative who has indicated his/her understanding and acceptance.     Dental advisory given  Plan Discussed with: CRNA and Anesthesiologist  Anesthesia Plan Comments:         Anesthesia Quick Evaluation

## 2020-01-14 NOTE — Op Note (Signed)
....  01/14/2020  9:47 AM    Maria Warren  [id number]   Pre-Op Dx:  Left supraglottic lesion  Post-op Dx: Left supraglottic lesion  Proc: Suspension Microlaryngoscopy with biopsy  Surg:  Maria Warren  Anes:  GOT  EBL:  <20ml  Comp:  none  Findings:  Left supraglottic lesion extending from laryngeal surface of epiglottis down to left false vocal fold.  No obvious involvement of anterior commissure or left true vocal fold.  Procedure: After the patient was identified in holding and the history and physical and consent was reviewed, the patient was taken to the operating room and placed in a supine position. General endotracheal anesthesia was induced in the normal fashion.  At this time, the patient was rotated 90 degrees and a shoulder roll was placed as well as well as a mouth guard.   At this time, Jako laryngoscope was inserted into the patient's oral cavity. Visualization of the oral cavity, oropharynx, pharynx and larynx was made. This demonstrated an epiglottic mass arising from the left laryngeal surface of epiglottis and false vocal fold on left.  This was exophytic in nature and extending down to but did not involve the left true vocal fold.  . A magnified zero degree Hopkin's rod was used to evaluate the mass. Topical epinephrine on pledgets were placed against the lesion for approximately one minute. At this time, the Jako laryngoscope was suspended from the [facility] stand.   Under high power magnification, a micro cup forcep was used to gently grasp the mass and multiple biopsies were taken with straght and 45 degree forceps.   Epi pledgets were placed for hemostasis.   The patient's entire airway was next evaluated for hemostasis and meticulous suctioning was performed. LTA was was sprayed onto the epiglottis and larynx by anesthesia. The patient was released from suspension and the wettened gauze and laryngoscope was removed from the patient's oral cavity without injury to  teeth, lips, or gums.  Dispo:   To PACU in good condition  Plan:  Soft Diet, follow up in 1 week for repeat evaluation and review of pathology.  Maria Warren  01/14/2020 9:47 AM

## 2020-01-14 NOTE — Discharge Instructions (Signed)

## 2020-01-14 NOTE — Anesthesia Procedure Notes (Addendum)
Procedure Name: Intubation Date/Time: 01/14/2020 9:24 AM Performed by: Doreen Salvage, CRNA Pre-anesthesia Checklist: Patient identified, Patient being monitored, Timeout performed, Emergency Drugs available and Suction available Patient Re-evaluated:Patient Re-evaluated prior to induction Oxygen Delivery Method: Circle system utilized Preoxygenation: Pre-oxygenation with 100% oxygen Induction Type: IV induction Ventilation: Mask ventilation without difficulty and Oral airway inserted - appropriate to patient size Laryngoscope Size: Mac, 3 and McGraph Grade View: Grade I Tube type: MLT Tube size: 6.0 mm Number of attempts: 1 Airway Equipment and Method: LTA kit utilized Placement Confirmation: ETT inserted through vocal cords under direct vision,  positive ETCO2 and breath sounds checked- equal and bilateral Secured at: 21 cm Tube secured with: Tape Dental Injury: Teeth and Oropharynx as per pre-operative assessment  Comments: MLT TUBE USED

## 2020-01-14 NOTE — H&P (Signed)
..  History and Physical paper copy reviewed and updated date of procedure and will be scanned into system.  Patient seen and examined.  

## 2020-01-14 NOTE — Transfer of Care (Signed)
Immediate Anesthesia Transfer of Care Note  Patient: Maria Warren  Procedure(s) Performed: suspension MICROLARYNGOSCOPY with biopsy of left laryngeal lesion (Left )  Patient Location: PACU  Anesthesia Type:General  Level of Consciousness: awake, alert  and oriented  Airway & Oxygen Therapy: Patient connected to face mask oxygen  Post-op Assessment: Post -op Vital signs reviewed and stable  Post vital signs: stable  Last Vitals:  Vitals Value Taken Time  BP 149/74   Temp    Pulse 109 01/14/20 1001  Resp 17 01/14/20 1001  SpO2 98 % 01/14/20 1001  Vitals shown include unvalidated device data.  Last Pain:  Vitals:   01/14/20 0814  TempSrc: Tympanic         Complications: No apparent anesthesia complications

## 2020-01-14 NOTE — Progress Notes (Signed)
Doing incentive spirometer with patient.  She is a smoker currently.

## 2020-01-14 NOTE — OR Nursing (Signed)
Per Dr. Pryor Ochoa verbal pt may resume taking aspirin tomorrow, added to d/c instructions under med list.

## 2020-01-14 NOTE — Anesthesia Postprocedure Evaluation (Signed)
Anesthesia Post Note  Patient: Maria Warren  Procedure(s) Performed: Suspension Microlaryngoscopy with biopsy (Left )  Patient location during evaluation: PACU Anesthesia Type: General Level of consciousness: awake and alert and oriented Pain management: pain level controlled Vital Signs Assessment: post-procedure vital signs reviewed and stable Respiratory status: spontaneous breathing, nonlabored ventilation and respiratory function stable Cardiovascular status: blood pressure returned to baseline and stable Postop Assessment: no signs of nausea or vomiting Anesthetic complications: no     Last Vitals:  Vitals:   01/14/20 1053 01/14/20 1111  BP:  109/62  Pulse:  (!) 103  Resp:  16  Temp: 36.6 C   SpO2:  96%    Last Pain:  Vitals:   01/14/20 1111  TempSrc:   PainSc: 0-No pain                 Avyanna Spada

## 2020-01-15 ENCOUNTER — Other Ambulatory Visit: Payer: Self-pay | Admitting: Anatomic Pathology & Clinical Pathology

## 2020-01-15 LAB — SURGICAL PATHOLOGY

## 2020-01-16 ENCOUNTER — Other Ambulatory Visit: Payer: Self-pay | Admitting: Otolaryngology

## 2020-01-16 DIAGNOSIS — D380 Neoplasm of uncertain behavior of larynx: Secondary | ICD-10-CM

## 2020-01-23 ENCOUNTER — Other Ambulatory Visit: Payer: Medicare Other

## 2020-01-23 NOTE — Progress Notes (Signed)
Tumor Board Documentation  Maria Warren was presented by Dr Pryor Ochoa at our Tumor Board on 01/23/2020, which included representatives from medical oncology, radiation oncology, navigation, pathology, radiology, surgical, surgical oncology, internal medicine, genetics, palliative care, research.  Khalina currently presents as an external consult, for [place], for new positive pathology with history of the following treatments: surgical intervention(s), active survellience.  Additionally, we reviewed previous medical and familial history, history of present illness, and recent lab results along with all available histopathologic and imaging studies. The tumor board considered available treatment options and made the following recommendations: Additional screening, Radiation therapy (primary modality)    The following procedures/referrals were also placed: No orders of the defined types were placed in this encounter.   Clinical Trial Status: not discussed   Staging used: To be determined  AJCC Staging: T: 2     Group: Squamous Cell carcinoma of Larynx   National site-specific guidelines   were discussed with respect to the case.  Tumor board is a meeting of clinicians from various specialty areas who evaluate and discuss patients for whom a multidisciplinary approach is being considered. Final determinations in the plan of care are those of the provider(s). The responsibility for follow up of recommendations given during tumor board is that of the provider.   Today's extended care, comprehensive team conference, Marge was not present for the discussion and was not examined.   Multidisciplinary Tumor Board is a multidisciplinary case peer review process.  Decisions discussed in the Multidisciplinary Tumor Board reflect the opinions of the specialists present at the conference without having examined the patient.  Ultimately, treatment and diagnostic decisions rest with the primary provider(s) and  the patient.

## 2020-01-27 ENCOUNTER — Other Ambulatory Visit: Payer: Self-pay | Admitting: *Deleted

## 2020-01-27 ENCOUNTER — Ambulatory Visit: Admission: RE | Admit: 2020-01-27 | Payer: Medicare Other | Source: Ambulatory Visit

## 2020-01-27 ENCOUNTER — Ambulatory Visit
Admission: RE | Admit: 2020-01-27 | Discharge: 2020-01-27 | Disposition: A | Discharge status: Home / Self Care | Payer: Medicare Other | Source: Ambulatory Visit | Attending: Radiation Oncology | Admitting: Radiation Oncology

## 2020-01-27 VITALS — BP 162/98 | HR 97 | Temp 97.7°F | Resp 16 | Wt 198.9 lb

## 2020-01-27 DIAGNOSIS — C760 Malignant neoplasm of head, face and neck: Secondary | ICD-10-CM

## 2020-01-27 NOTE — Consult Note (Signed)
NEW PATIENT EVALUATION  Name: Maria Warren  MRN: [id number]  Date:   01/27/2020     DOB: 02/16/1956   This 64 y.o. female patient presents to the clinic for initial evaluation of left supraglottic squamous cell carcinoma.  REFERRING PHYSICIAN: Elba Barman, MD  CHIEF COMPLAINT:  Chief Complaint  Patient presents with  . Cancer    Initial consultation    DIAGNOSIS: The encounter diagnosis was Head and neck cancer ([place]).   PREVIOUS INVESTIGATIONS:  PET CT scan ordered Pathology report reviewed Clinical notes including op report reviewed  HPI: Patient is a 64 year old female who presented with to ENT with some slight increased head and neck pain.  She was found to have an initial evaluation a left supraglottic lesion.  She was taken to the OR there was a left supraglottic lesion extending from the residual surface of the epiglottis down to the left false vocal cord no involvement of anterior commissure left true cord was noted.  Biopsy was positive for invasive moderately differentiated squamous cell carcinoma with minimal keratinization.  She had a CT scan scheduled I have changed that to a PET CT scan for complete clinical staging.  She is having no dysphagia at this time.  PLANNED TREATMENT REGIMEN: Complete staging with PET CT scan and probable radiation therapy with curative intent  PAST MEDICAL HISTORY:  has a past medical history of Anxiety, Arthritis, Carotid stenosis, COPD (chronic obstructive pulmonary disease) ([place]), Coronary artery disease, Depression, GERD (gastroesophageal reflux disease), Headache, Hypertension, Hypothyroidism, Lower extremity edema, Thyroid disease, and Wears dentures.    PAST SURGICAL HISTORY:  Past Surgical History:  Procedure Laterality Date  . CARDIAC CATHETERIZATION    . CATARACT EXTRACTION W/PHACO Right 03/07/2017   Procedure: CATARACT EXTRACTION PHACO AND INTRAOCULAR LENS PLACEMENT ([place])  Right;  Surgeon: Eulogio Bear, MD;  Location:  [facility];  Service: Ophthalmology;  Laterality: Right;  . CATARACT EXTRACTION W/PHACO Left 04/11/2017   Procedure: CATARACT EXTRACTION PHACO AND INTRAOCULAR LENS PLACEMENT ([place])  left;  Surgeon: Eulogio Bear, MD;  Location: [facility];  Service: Ophthalmology;  Laterality: Left;  . CESAREAN SECTION     x 2  . COLONOSCOPY N/A 11/24/2015   Procedure: COLONOSCOPY;  Surgeon: Lollie Sails, MD;  Location: [facility] ENDOSCOPY;  Service: Endoscopy;  Laterality: N/A;  . ESOPHAGOGASTRODUODENOSCOPY (EGD) WITH PROPOFOL N/A 11/24/2015   Procedure: ESOPHAGOGASTRODUODENOSCOPY (EGD) WITH PROPOFOL;  Surgeon: Lollie Sails, MD;  Location: [facility] ENDOSCOPY;  Service: Endoscopy;  Laterality: N/A;  . EYE SURGERY    . JOINT REPLACEMENT    . LEFT HEART CATH AND CORONARY ANGIOGRAPHY Left 01/11/2017   Procedure: Left Heart Cath and Coronary Angiography;  Surgeon: Corey Skains, MD;  Location: [facility] CV LAB;  Service: Cardiovascular;  Laterality: Left;  Marland Kitchen MICROLARYNGOSCOPY Left 01/14/2020   Procedure: Suspension Microlaryngoscopy with biopsy;  Surgeon: Carloyn Manner, MD;  Location: [facility] ORS;  Service: ENT;  Laterality: Left;  . REVERSE SHOULDER ARTHROPLASTY Left 04/26/2018   Procedure: REVERSE SHOULDER ARTHROPLASTY;  Surgeon: Corky Mull, MD;  Location: [facility] ORS;  Service: Orthopedics;  Laterality: Left;  . TOTAL KNEE ARTHROPLASTY Left 01/29/2018   Procedure: TOTAL KNEE ARTHROPLASTY;  Surgeon: Lovell Sheehan, MD;  Location: [facility] ORS;  Service: Orthopedics;  Laterality: Left;  . TOTAL THYROIDECTOMY    . TUBAL LIGATION      FAMILY HISTORY: family history includes Breast cancer in her maternal aunt; Heart attack in her father; Heart failure in her mother.  SOCIAL HISTORY:  reports that she has been smoking cigarettes. She has a 25.00 pack-year smoking history. She has never used smokeless tobacco. She reports current alcohol use of about 12.0 standard drinks of alcohol per week. She  reports previous drug use. Drugs: Cocaine and Marijuana.  ALLERGIES: Topiramate, Lyrica [pregabalin], Zoloft [sertraline hcl], and Bupropion  MEDICATIONS:  Current Outpatient Medications  Medication Sig Dispense Refill  . acetaminophen (TYLENOL) 650 MG CR tablet Take 650-1,300 mg by mouth every 8 (eight) hours as needed for pain.    Marland Kitchen aspirin EC 81 MG tablet Take 81 mg by mouth daily.    Marland Kitchen atorvastatin (LIPITOR) 80 MG tablet Take 80 mg by mouth every evening.     . B Complex-C (B-COMPLEX WITH VITAMIN C) tablet Take 1 tablet by mouth daily.    . Biotin 1000 MCG tablet Take 500 mcg by mouth daily.    . calcitRIOL (ROCALTROL) 0.5 MCG capsule Take 1 mcg by mouth in the morning and at bedtime.    . Cholecalciferol (VITAMIN D3) 50 MCG (2000 UT) TABS Take 2,000 Units by mouth daily.    . diphenhydrAMINE (BENADRYL) 25 mg capsule Take 25 mg by mouth at bedtime as needed (for nose/watery eyes).     . isosorbide mononitrate (IMDUR) 30 MG 24 hr tablet Take 30 mg by mouth daily.    Marland Kitchen levothyroxine (SYNTHROID, LEVOTHROID) 125 MCG tablet Take 125 mcg by mouth daily before breakfast.    . nitroGLYCERIN (NITROSTAT) 0.4 MG SL tablet Place 0.4 mg under the tongue every 5 (five) minutes as needed for chest pain.    Marland Kitchen ondansetron (ZOFRAN) 4 MG tablet Take 1 tablet (4 mg total) by mouth every 8 (eight) hours as needed for up to 10 doses for nausea or vomiting. 20 tablet 0  . pantoprazole (PROTONIX) 40 MG tablet Take 40 mg by mouth daily before breakfast.    . tiotropium (SPIRIVA) 18 MCG inhalation capsule Place 18 mcg into inhaler and inhale daily.    . vitamin B-12 (CYANOCOBALAMIN) 500 MCG tablet Take 1,000 mcg by mouth daily.    . vitamin E 180 MG (400 UNITS) capsule Take 400 Units by mouth daily.     No current facility-administered medications for this encounter.    ECOG PERFORMANCE STATUS:  1 - Symptomatic but completely ambulatory  REVIEW OF SYSTEMS: Patient denies any weight loss, fatigue, weakness,  fever, chills or night sweats. Patient denies any loss of vision, blurred vision. Patient denies any ringing  of the ears or hearing loss. No irregular heartbeat. Patient denies heart murmur or history of fainting. Patient denies any chest pain or pain radiating to her upper extremities. Patient denies any shortness of breath, difficulty breathing at night, cough or hemoptysis. Patient denies any swelling in the lower legs. Patient denies any nausea vomiting, vomiting of blood, or coffee ground material in the vomitus. Patient denies any stomach pain. Patient states has had normal bowel movements no significant constipation or diarrhea. Patient denies any dysuria, hematuria or significant nocturia. Patient denies any problems walking, swelling in the joints or loss of balance. Patient denies any skin changes, loss of hair or loss of weight. Patient denies any excessive worrying or anxiety or significant depression. Patient denies any problems with insomnia. Patient denies excessive thirst, polyuria, polydipsia. Patient denies any swollen glands, patient denies easy bruising or easy bleeding. Patient denies any recent infections, allergies or URI. Patient "s visual fields have not changed significantly in recent time.   PHYSICAL EXAM:  BP (!) 162/98   Pulse 97   Temp 97.7 F (36.5 C)   Resp 16   Wt 198 lb 14.4 oz (90.2 kg)   SpO2 96%   BMI 35.23 kg/m  Neck is clear without evidence of cervical or supraclavicular adenopathy.  Well-developed well-nourished patient in NAD. HEENT reveals PERLA, EOMI, discs not visualized.  Oral cavity is clear. No oral mucosal lesions are identified. Neck is clear without evidence of cervical or supraclavicular adenopathy. Lungs are clear to A&P. Cardiac examination is essentially unremarkable with regular rate and rhythm without murmur rub or thrill. Abdomen is benign with no organomegaly or masses noted. Motor sensory and DTR levels are equal and symmetric in the upper and  lower extremities. Cranial nerves II through XII are grossly intact. Proprioception is intact. No peripheral adenopathy or edema is identified. No motor or sensory levels are noted. Crude visual fields are within normal range.  LABORATORY DATA: Pathology report reviewed    RADIOLOGY RESULTS: PET CT scan ordered will be reviewed   IMPRESSION: Squamous cell carcinoma of the supraglottic larynx and 64 year old female not completely staged at this time pending PET CT scan.  PLAN: At this time I ordered a PET CT scan for complete staging.  Depending on that we will make recommendations as to radiation therapy alone versus concurrent chemoradiation therapy depending on the ultimate staging.  Risks and benefits of radiation including increased dysphagia alteration of taste dryness of the mouth skin reaction fatigue all were reviewed in detail with the patient.  She seems to comprehend my treatment plan well.  I will see her shortly after her PET CT scan and make further recommendations at that time.  I have already scheduled her for CT simulation.  I would like to take this opportunity to thank you for allowing me to participate in the care of your patient.Noreene Filbert, MD

## 2020-01-28 ENCOUNTER — Other Ambulatory Visit: Payer: Self-pay

## 2020-01-29 ENCOUNTER — Encounter
Admission: RE | Admit: 2020-01-29 | Discharge: 2020-01-29 | Disposition: A | Discharge status: Home / Self Care | Payer: Medicare Other | Source: Ambulatory Visit | Attending: Radiation Oncology | Admitting: Radiation Oncology

## 2020-01-29 DIAGNOSIS — Z79899 Other long term (current) drug therapy: Secondary | ICD-10-CM | POA: Insufficient documentation

## 2020-01-29 DIAGNOSIS — C760 Malignant neoplasm of head, face and neck: Secondary | ICD-10-CM | POA: Insufficient documentation

## 2020-01-29 LAB — GLUCOSE, CAPILLARY: Glucose-Capillary: 94 mg/dL (ref 70–99)

## 2020-01-29 MED ORDER — FLUDEOXYGLUCOSE F - 18 (FDG) INJECTION
10.3000 | Freq: Once | INTRAVENOUS | Status: AC | PRN
  Administered 2020-01-29: 11.26 via INTRAVENOUS

## 2020-01-30 ENCOUNTER — Ambulatory Visit
Admission: RE | Admit: 2020-01-30 | Discharge: 2020-01-30 | Disposition: A | Discharge status: Home / Self Care | Payer: Medicare Other | Source: Ambulatory Visit | Attending: Radiation Oncology | Admitting: Radiation Oncology

## 2020-01-30 ENCOUNTER — Other Ambulatory Visit: Payer: Self-pay | Admitting: *Deleted

## 2020-01-30 DIAGNOSIS — C760 Malignant neoplasm of head, face and neck: Secondary | ICD-10-CM

## 2020-01-30 DIAGNOSIS — Z510 Encounter for antineoplastic radiation therapy: Secondary | ICD-10-CM | POA: Diagnosis present

## 2020-01-30 DIAGNOSIS — C321 Malignant neoplasm of supraglottis: Secondary | ICD-10-CM | POA: Diagnosis present

## 2020-02-04 ENCOUNTER — Encounter: Payer: Self-pay | Admitting: Oncology

## 2020-02-04 DIAGNOSIS — Z510 Encounter for antineoplastic radiation therapy: Secondary | ICD-10-CM | POA: Diagnosis not present

## 2020-02-04 NOTE — Progress Notes (Signed)
Patient contacted for new patient visit. Pt aware of referral and appt. Chart reviewed and updated. Pt is a currently smoker and has cut down to 1/2 pack a day. She currently drinks 1-2 cans of beer a day. No concerns voiced.

## 2020-02-05 ENCOUNTER — Inpatient Hospital Stay: Payer: Medicare Other

## 2020-02-05 ENCOUNTER — Encounter: Payer: Self-pay | Admitting: Oncology

## 2020-02-05 ENCOUNTER — Other Ambulatory Visit: Payer: Self-pay

## 2020-02-05 ENCOUNTER — Inpatient Hospital Stay: Payer: Medicare Other | Attending: Oncology | Admitting: Oncology

## 2020-02-05 VITALS — BP 162/92 | HR 88 | Temp 97.6°F | Resp 20 | Wt 197.6 lb

## 2020-02-05 DIAGNOSIS — Z7289 Other problems related to lifestyle: Secondary | ICD-10-CM | POA: Insufficient documentation

## 2020-02-05 DIAGNOSIS — R599 Enlarged lymph nodes, unspecified: Secondary | ICD-10-CM | POA: Diagnosis not present

## 2020-02-05 DIAGNOSIS — R948 Abnormal results of function studies of other organs and systems: Secondary | ICD-10-CM | POA: Diagnosis not present

## 2020-02-05 DIAGNOSIS — E039 Hypothyroidism, unspecified: Secondary | ICD-10-CM | POA: Diagnosis not present

## 2020-02-05 DIAGNOSIS — E538 Deficiency of other specified B group vitamins: Secondary | ICD-10-CM | POA: Diagnosis not present

## 2020-02-05 DIAGNOSIS — C760 Malignant neoplasm of head, face and neck: Secondary | ICD-10-CM | POA: Diagnosis present

## 2020-02-05 DIAGNOSIS — Z803 Family history of malignant neoplasm of breast: Secondary | ICD-10-CM | POA: Diagnosis not present

## 2020-02-05 DIAGNOSIS — D649 Anemia, unspecified: Secondary | ICD-10-CM | POA: Diagnosis not present

## 2020-02-05 DIAGNOSIS — F1090 Alcohol use, unspecified, uncomplicated: Secondary | ICD-10-CM

## 2020-02-05 DIAGNOSIS — Z789 Other specified health status: Secondary | ICD-10-CM

## 2020-02-05 DIAGNOSIS — Z720 Tobacco use: Secondary | ICD-10-CM

## 2020-02-05 DIAGNOSIS — F17210 Nicotine dependence, cigarettes, uncomplicated: Secondary | ICD-10-CM | POA: Insufficient documentation

## 2020-02-05 LAB — IRON AND TIBC
Iron: 58 ug/dL (ref 28–170)
Saturation Ratios: 13 % (ref 10.4–31.8)
TIBC: 442 ug/dL (ref 250–450)
UIBC: 384 ug/dL

## 2020-02-05 LAB — CBC WITH DIFFERENTIAL/PLATELET
Abs Immature Granulocytes: 0.03 10*3/uL (ref 0.00–0.07)
Basophils Absolute: 0.1 10*3/uL (ref 0.0–0.1)
Basophils Relative: 1 %
Eosinophils Absolute: 0.3 10*3/uL (ref 0.0–0.5)
Eosinophils Relative: 3 %
HCT: 36.6 % (ref 36.0–46.0)
Hemoglobin: 11.7 g/dL — ABNORMAL LOW (ref 12.0–15.0)
Immature Granulocytes: 0 %
Lymphocytes Relative: 28 %
Lymphs Abs: 2.8 10*3/uL (ref 0.7–4.0)
MCH: 30.1 pg (ref 26.0–34.0)
MCHC: 32 g/dL (ref 30.0–36.0)
MCV: 94.1 fL (ref 80.0–100.0)
Monocytes Absolute: 0.9 10*3/uL (ref 0.1–1.0)
Monocytes Relative: 9 %
Neutro Abs: 6 10*3/uL (ref 1.7–7.7)
Neutrophils Relative %: 59 %
Platelets: 308 10*3/uL (ref 150–400)
RBC: 3.89 MIL/uL (ref 3.87–5.11)
RDW: 14.4 % (ref 11.5–15.5)
WBC: 10.1 10*3/uL (ref 4.0–10.5)
nRBC: 0 % (ref 0.0–0.2)

## 2020-02-05 LAB — COMPREHENSIVE METABOLIC PANEL
ALT: 19 U/L (ref 0–44)
AST: 23 U/L (ref 15–41)
Albumin: 4.1 g/dL (ref 3.5–5.0)
Alkaline Phosphatase: 60 U/L (ref 38–126)
Anion gap: 11 (ref 5–15)
BUN: 12 mg/dL (ref 8–23)
CO2: 29 mmol/L (ref 22–32)
Calcium: 8.7 mg/dL — ABNORMAL LOW (ref 8.9–10.3)
Chloride: 103 mmol/L (ref 98–111)
Creatinine, Ser: 0.96 mg/dL (ref 0.44–1.00)
GFR calc Af Amer: >60 mL/min (ref >60)
GFR calc non Af Amer: >60 mL/min (ref >60)
Glucose, Bld: 105 mg/dL — ABNORMAL HIGH (ref 70–99)
Potassium: 3.8 mmol/L (ref 3.5–5.1)
Sodium: 143 mmol/L (ref 135–145)
Total Bilirubin: 0.6 mg/dL (ref 0.3–1.2)
Total Protein: 7.6 g/dL (ref 6.5–8.1)

## 2020-02-05 LAB — RETIC PANEL
Immature Retic Fract: 11.9 % (ref 2.3–15.9)
RBC.: 3.92 MIL/uL (ref 3.87–5.11)
Retic Count, Absolute: 55.3 10*3/uL (ref 19.0–186.0)
Retic Ct Pct: 1.4 % (ref 0.4–3.1)
Reticulocyte Hemoglobin: 31.7 pg (ref >27.9)

## 2020-02-05 LAB — VITAMIN B12: Vitamin B-12: 331 pg/mL (ref 180–914)

## 2020-02-05 LAB — FERRITIN: Ferritin: 8 ng/mL — ABNORMAL LOW (ref 11–307)

## 2020-02-05 LAB — FOLATE: Folate: 13.6 ng/mL (ref >5.9)

## 2020-02-05 LAB — TSH: TSH: 1.608 u[IU]/mL (ref 0.350–4.500)

## 2020-02-05 NOTE — Progress Notes (Signed)
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 Patient recently presented to ENT physician for increased head and neck pain Laryngoscope found a supraglottic lesion extending from the laryngeal surface of the epiglottis down to the left false vocal cord, no involvement of anterior commissure or left true vocal cord. Biopsy was taken and the results came back positive for invasive moderately differentiated squamous cell carcinoma with minimal keratinization Patient was referred to radiation oncology was seen by Dr. Baruch Gouty.  He ordered PET scan for further evaluation. Patient was also referred to medical oncology for further evaluation.  She denies any dysphagia symptoms.  Today patient was accompanied by her friend.  She denies any new complaints. She thought she was referred to see me for evaluation of anemia. Patient reports a longstanding history of anemia.  Also vitamin B12 deficiency she takes 1000 mg of vitamin B-12 daily. Denies any bloody or black stool.  Review of Systems  Constitutional: Negative for appetite change, chills, fatigue and fever.  HENT:   Negative for hearing loss and voice change.   Eyes: Negative for eye problems.  Respiratory: Negative for chest tightness and cough.   Cardiovascular: Negative for chest pain.  Gastrointestinal: Negative for abdominal distention, abdominal pain and blood in stool.  Endocrine:  Negative for hot flashes.  Genitourinary: Negative for difficulty urinating and frequency.   Musculoskeletal: Negative for arthralgias.  Skin: Negative for itching and rash.  Neurological: Negative for extremity weakness.  Hematological: Negative for adenopathy.  Psychiatric/Behavioral: Negative for confusion.    MEDICAL HISTORY:  Past Medical History:  Diagnosis Date  . Anxiety   . Arthritis    knees, lower back  . Carotid stenosis   . COPD (chronic obstructive pulmonary disease) ([place])   . Coronary artery disease   . Depression   . GERD (gastroesophageal reflux disease)   . Headache    Pain over left eye, then vision in left eye goes black, quickly resolves.  Said Dr. Melony Overly office aware.  . Hypertension   . Hypothyroidism   . Lower extremity edema   . Thyroid disease   . Wears dentures    full upper    SURGICAL HISTORY: Past Surgical History:  Procedure Laterality Date  . CARDIAC CATHETERIZATION    . CATARACT EXTRACTION W/PHACO Right 03/07/2017   Procedure: CATARACT EXTRACTION PHACO AND INTRAOCULAR LENS PLACEMENT ([place])  Right;  Surgeon: Eulogio Bear, MD;  Location: [facility];  Service: Ophthalmology;  Laterality: Right;  . CATARACT EXTRACTION W/PHACO Left 04/11/2017   Procedure: CATARACT EXTRACTION PHACO AND INTRAOCULAR LENS PLACEMENT ([place])  left;  Surgeon: Eulogio Bear, MD;  Location: [place];  Service: Ophthalmology;  Laterality: Left;  . CESAREAN SECTION     x 2  . COLONOSCOPY N/A 11/24/2015   Procedure: COLONOSCOPY;  Surgeon: Lollie Sails, MD;  Location[facility] END[facility]COPY;  Service: Endoscopy;  Laterality: N/A;  . ESOPHAGOGASTRODUODENOSCOPY (EGD) WITH PROPOFOL N/A 11/24/2015   Procedure: ESOPHAGOGASTRODUODENOSCOPY (EGD) WITH PROPOFOL;  Surgeon: Hassell Done  Kassie Mends, MD;  Location: [facility] ENDOSCOPY;  Service: Endoscopy;  Laterality: N/A;  . EYE SURGERY    . JOINT REPLACEMENT    . LEFT HEART CATH AND CORONARY ANGIOGRAPHY Left 01/11/2017    Procedure: Left Heart Cath and Coronary Angiography;  Surgeon: Corey Skains, MD;  Location: [place] CV LAB;  Service: Cardiovascular;  Laterality: Left;  Marland Kitchen MICROLARYNGOSCOPY Left 01/14/2020   Procedure: Suspension Microlaryngoscopy with biopsy;  Surgeon: Carloyn Manner, MD;  Location: [facility] ORS;  Service: ENT;  Laterality: Left;  . REVERSE SHOULDER ARTHROPLASTY Left 04/26/2018   Procedure: REVERSE SHOULDER ARTHROPLASTY;  Surgeon: Corky Mull, MD;  Location: [facility] ORS;  Service: Orthopedics;  Laterality: Left;  . TOTAL KNEE ARTHROPLASTY Left 01/29/2018   Procedure: TOTAL KNEE ARTHROPLASTY;  Surgeon: Lovell Sheehan, MD;  Location: [facility] ORS;  Service: Orthopedics;  Laterality: Left;  . TOTAL THYROIDECTOMY    . TUBAL LIGATION      SOCIAL HISTORY: Social History   Socioeconomic History  . Marital status: Widowed    Spouse name: Not on file  . Number of children: Not on file  . Years of education: Not on file  . Highest education level: Not on file  Occupational History  . Occupation: disabled   Tobacco Use  . Smoking status: Current Every Day Smoker    Packs/day: 0.50    Years: 25.00    Pack years: 12.50    Types: Cigarettes  . Smokeless tobacco: Never Used  Substance and Sexual Activity  . Alcohol use: Yes    Alcohol/week: 12.0 standard drinks    Types: 12 Cans of beer per week    Comment: 2 cans a night   . Drug use: Not Currently    Types: Cocaine, Marijuana    Comment: drug use in 1980s  . Sexual activity: Never  Other Topics Concern  . Not on file  Social History Narrative  . Not on file   Social Determinants of Health   Financial Resource Strain:   . Difficulty of Paying Living Expenses:   Food Insecurity:   . Worried About Charity fundraiser in the Last Year:   . Arboriculturist in the Last Year:   Transportation Needs:   . Film/video editor (Medical):   Marland Kitchen Lack of Transportation (Non-Medical):   Physical Activity:   . Days of Exercise per Week:    . Minutes of Exercise per Session:   Stress:   . Feeling of Stress :   Social Connections:   . Frequency of Communication with Friends and Family:   . Frequency of Social Gatherings with Friends and Family:   . Attends Religious Services:   . Active Member of Clubs or Organizations:   . Attends Archivist Meetings:   Marland Kitchen Marital Status:   Intimate Partner Violence:   . Fear of Current or Ex-Partner:   . Emotionally Abused:   Marland Kitchen Physically Abused:   . Sexually Abused:     FAMILY HISTORY: Family History  Problem Relation Age of Onset  . Breast cancer Maternal Aunt   . Heart failure Mother   . Arthritis Mother   . Heart attack Father     ALLERGIES:  is allergic to topiramate; lyrica [pregabalin]; zoloft [sertraline hcl]; and bupropion.  MEDICATIONS:  Current Outpatient Medications  Medication Sig Dispense Refill  . acetaminophen (TYLENOL) 650 MG CR tablet Take 650-1,300 mg by mouth every 8 (eight) hours as needed for pain.    Marland Kitchen  aspirin EC 81 MG tablet Take 81 mg by mouth daily.    Marland Kitchen atorvastatin (LIPITOR) 80 MG tablet Take 80 mg by mouth every evening.     . B Complex-C (B-COMPLEX WITH VITAMIN C) tablet Take 1 tablet by mouth daily.    . Biotin 1000 MCG tablet Take 500 mcg by mouth daily.    . calcitRIOL (ROCALTROL) 0.5 MCG capsule Take 1 mcg by mouth in the morning and at bedtime.    . Cholecalciferol (VITAMIN D3) 50 MCG (2000 UT) TABS Take 2,000 Units by mouth daily.    . diphenhydrAMINE (BENADRYL) 25 mg capsule Take 25 mg by mouth at bedtime as needed (for nose/watery eyes).     . isosorbide mononitrate (IMDUR) 30 MG 24 hr tablet Take 30 mg by mouth daily.    Marland Kitchen levothyroxine (SYNTHROID, LEVOTHROID) 125 MCG tablet Take 125 mcg by mouth daily before breakfast.    . nitroGLYCERIN (NITROSTAT) 0.4 MG SL tablet Place 0.4 mg under the tongue every 5 (five) minutes as needed for chest pain.    Marland Kitchen ondansetron (ZOFRAN) 4 MG tablet Take 1 tablet (4 mg total) by mouth every 8  (eight) hours as needed for up to 10 doses for nausea or vomiting. 20 tablet 0  . pantoprazole (PROTONIX) 40 MG tablet Take 40 mg by mouth daily before breakfast.    . tiotropium (SPIRIVA) 18 MCG inhalation capsule Place 18 mcg into inhaler and inhale daily.    . vitamin B-12 (CYANOCOBALAMIN) 500 MCG tablet Take 1,000 mcg by mouth daily.    . vitamin E 180 MG (400 UNITS) capsule Take 400 Units by mouth daily.     No current facility-administered medications for this visit.     PHYSICAL EXAMINATION: ECOG PERFORMANCE STATUS: 0 - Asymptomatic Vitals:   02/05/20 1054  BP: (!) 162/92  Pulse: 88  Resp: 20  Temp: 97.6 F (36.4 C)   Filed Weights   02/05/20 1054  Weight: 197 lb 9.6 oz (89.6 kg)    Physical Exam Constitutional:      General: She is not in acute distress.    Appearance: She is obese.  HENT:     Head: Normocephalic and atraumatic.  Eyes:     General: No scleral icterus. Cardiovascular:     Rate and Rhythm: Normal rate and regular rhythm.     Heart sounds: Normal heart sounds.  Pulmonary:     Effort: Pulmonary effort is normal. No respiratory distress.     Breath sounds: No wheezing.  Abdominal:     General: Bowel sounds are normal. There is no distension.     Palpations: Abdomen is soft.  Musculoskeletal:        General: No deformity. Normal range of motion.     Cervical back: Normal range of motion and neck supple.  Skin:    General: Skin is warm and dry.     Findings: No erythema or rash.  Neurological:     Mental Status: She is alert and oriented to person, place, and time. Mental status is at baseline.     Cranial Nerves: No cranial nerve deficit.     Coordination: Coordination normal.  Psychiatric:        Mood and Affect: Mood normal.     LABORATORY DATA:  I have reviewed the data as listed Lab Results  Component Value Date   WBC 10.1 01/10/2020   HGB 11.2 (L) 01/10/2020   HCT 35.0 (L) 01/10/2020   MCV 94.9 01/10/2020  PLT 276 01/10/2020    Recent Labs    01/10/20 0936  NA 139  K 3.6  CL 103  CO2 28  GLUCOSE 132*  BUN 13  CREATININE 0.98  CALCIUM 8.3*  GFRNONAA >60  GFRAA >60   Iron/TIBC/Ferritin/ %Sat No results found for: IRON, TIBC, FERRITIN, IRONPCTSAT    RADIOGRAPHIC STUDIES: I have personally reviewed the radiological images as listed and agreed with the findings in the report. NM PET Image Initial (PI) Skull Base To Thigh  Result Date: 01/29/2020 CLINICAL DATA:  Initial treatment strategy for head and neck cancer. EXAM: NUCLEAR MEDICINE PET SKULL BASE TO THIGH TECHNIQUE: 11.26 mCi F-18 FDG was injected intravenously. Full-ring PET imaging was performed from the skull base to thigh after the radiotracer. CT data was obtained and used for attenuation correction and anatomic localization. Fasting blood glucose: 94 mg/dl COMPARISON:  10/24/2018 FINDINGS: Mediastinal blood pool activity: SUV max 2.82 Liver activity: SUV max NA NECK: Left supraglottic lesion showing fullness of the left supraglottic larynx and increased metabolic activity perhaps measuring as large as 1.7 x 1.1 cm (image 45, series 4 and series 3) (SUVmax = 6.54) Small lymph nodes in the neck bilaterally none displaying FDG uptake greater than blood pool left level II a lymph node (image 43, series 3) 7 mm (SUVmax = 2.0) Left level 2 a lymph node (image 39, series 3) just anterior to sternocleidomastoid 7.3 mm (SUVmax = 2.2) Incidental CT findings: Carotid atherosclerosis bilaterally. CHEST: No hypermetabolic mediastinal or hilar nodes. No suspicious pulmonary nodules on the CT scan. Incidental CT findings: Lungs are clear, no sign of pleural effusion or consolidation. Airways are patent. Calcified atherosclerotic changes of the thoracic aorta without aneurysm. Calcified coronary artery disease, three-vessel calcification. Heart size is normal without pericardial effusion. Scattered small lymph nodes throughout the mediastinum and in the chest most showing  fatty hila and none with increased metabolic activity. ABDOMEN/PELVIS: No abnormal hypermetabolic activity within the liver, pancreas, adrenal glands, or spleen. No hypermetabolic lymph nodes in the abdomen or pelvis. Increased perianal and anal activity in the absence of visible lesion likely relates to variable gastrointestinal activity. Incidental CT findings: Noncontrast appearance of liver, gallbladder, spleen, pancreas, adrenal glands and kidneys is unremarkable. No evidence of hydronephrosis or nephrolithiasis. Urinary bladder is collapse. Colonic diverticulosis. No evidence of diverticulitis. Normal appendix. Extensive abdominal atherosclerotic changes. SKELETON: No focal hypermetabolic activity to suggest skeletal metastasis. Incidental CT findings: Spinal degenerative changes. Postoperative changes of left shoulder arthroplasty. IMPRESSION: 1. Left laryngeal mass with hypermetabolic activity compatible with known primary in this location. 2. Small lymph nodes in the left neck with activity just at or below blood pool showing rounded morphology as described. No definitive findings of metastatic disease. 3. No sign of distant metastases. 4. Intense more focal uptake in the anus could be physiologic. Given that it is well beyond uptake in the liver would consider direct clinical inspection, digital rectal exam or comparison with recent colonoscopy results. Electronically Signed   By: Zetta Bills M.D.   On: 01/29/2020 13:45      ASSESSMENT & PLAN:  1. Head and neck cancer ([place])   2. Normocytic anemia   3. Abnormal positron emission tomography (PET) scan   4. Tobacco abuse   5. Alcohol use    #Left supraglottic squamous cell carcinoma T2 PET scan was independent reviewed by me and discussed with patient. She has a small left cervical lymph node which is just at or below blood pool.  No  definitive findings of metastatic disease. No signs of distant metastasis Intense more focal uptake in the  anus could be physiologic. Discussed with patient that clinically she has T2 N0 disease, definitive radiation versus surgery.  Patient is not interested in surgery option. Assuming left cervical lymph node activity is reactive, I will not offer therapy. Attention on follow-up. I discussed the plan with Dr. Baruch Gouty Check TSH prior to RT. She is on Synthroid for hypothyroidism.   Smoke cessation and alcohol cessation were discussed with patient details.  Normocytic anemia, chronic.  I recommend checking CBC, CMP, iron, TIBC ferritin, folate, vitamin B12. Patient has a history of vitamin B12 deficiency which can be related to her alcohol use. Increased anal FDG activity patient had colonoscopy done in 2017.  She is asymptomatic.  Anal activity likely is reactive.  I will refer her to Dr. Alice Reichert for direct visualization and evaluation.  Orders Placed This Encounter  Procedures  . Iron and TIBC    Standing Status:   Future    Number of Occurrences:   1    Standing Expiration Date:   08/06/2021  . Ferritin    Standing Status:   Future    Number of Occurrences:   1    Standing Expiration Date:   08/06/2021  . Folate    Standing Status:   Future    Number of Occurrences:   1    Standing Expiration Date:   08/06/2021  . Vitamin B12    Standing Status:   Future    Number of Occurrences:   1    Standing Expiration Date:   08/06/2021  . Retic Panel    Standing Status:   Future    Number of Occurrences:   1    Standing Expiration Date:   08/06/2021  . CBC with Differential/Platelet    Standing Status:   Future    Number of Occurrences:   1    Standing Expiration Date:   08/06/2021  . Comprehensive metabolic panel    Standing Status:   Future    Number of Occurrences:   1    Standing Expiration Date:   08/06/2021  . TSH    Standing Status:   Future    Number of Occurrences:   1    Standing Expiration Date:   08/06/2021  . Ambulatory referral to Gastroenterology    Referral Priority:    Routine    Referral Type:   Consultation    Referral Reason:   Specialty Services Required    Referred to Provider:   Efrain Sella, MD    Number of Visits Requested:   1    All questions were answered. The patient knows to call the clinic with any problems questions or concerns.  cc Noreene Filbert, MD    Return of visit: To be determined Thank you for this kind referral and the opportunity to participate in the care of this patient. A copy of today's note is routed to referring provider    Earlie Server, MD, PhD Hematology Oncology [facility] Pager- [phone] 02/05/2020

## 2020-02-07 ENCOUNTER — Other Ambulatory Visit: Payer: Self-pay | Admitting: *Deleted

## 2020-02-07 DIAGNOSIS — C760 Malignant neoplasm of head, face and neck: Secondary | ICD-10-CM

## 2020-02-10 ENCOUNTER — Ambulatory Visit: Admission: RE | Admit: 2020-02-10 | Payer: Medicare Other | Source: Ambulatory Visit

## 2020-02-11 ENCOUNTER — Ambulatory Visit
Admission: RE | Admit: 2020-02-11 | Discharge: 2020-02-11 | Disposition: A | Discharge status: Home / Self Care | Payer: Medicare Other | Source: Ambulatory Visit | Attending: Radiation Oncology | Admitting: Radiation Oncology

## 2020-02-11 DIAGNOSIS — Z510 Encounter for antineoplastic radiation therapy: Secondary | ICD-10-CM | POA: Diagnosis not present

## 2020-02-12 ENCOUNTER — Ambulatory Visit: Payer: Medicare Other

## 2020-02-13 ENCOUNTER — Ambulatory Visit
Admission: RE | Admit: 2020-02-13 | Discharge: 2020-02-13 | Disposition: A | Discharge status: Home / Self Care | Payer: Medicare Other | Source: Ambulatory Visit | Attending: Radiation Oncology | Admitting: Radiation Oncology

## 2020-02-13 DIAGNOSIS — Z510 Encounter for antineoplastic radiation therapy: Secondary | ICD-10-CM | POA: Diagnosis not present

## 2020-02-14 ENCOUNTER — Ambulatory Visit
Admission: RE | Admit: 2020-02-14 | Discharge: 2020-02-14 | Disposition: A | Discharge status: Home / Self Care | Payer: Medicare Other | Source: Ambulatory Visit | Attending: Radiation Oncology | Admitting: Radiation Oncology

## 2020-02-14 DIAGNOSIS — Z510 Encounter for antineoplastic radiation therapy: Secondary | ICD-10-CM | POA: Diagnosis not present

## 2020-02-17 ENCOUNTER — Ambulatory Visit
Admission: RE | Admit: 2020-02-17 | Discharge: 2020-02-17 | Disposition: A | Discharge status: Home / Self Care | Payer: Medicare Other | Source: Ambulatory Visit | Attending: Radiation Oncology | Admitting: Radiation Oncology

## 2020-02-17 DIAGNOSIS — Z510 Encounter for antineoplastic radiation therapy: Secondary | ICD-10-CM | POA: Diagnosis not present

## 2020-02-18 ENCOUNTER — Ambulatory Visit
Admission: RE | Admit: 2020-02-18 | Discharge: 2020-02-18 | Disposition: A | Discharge status: Home / Self Care | Payer: Medicare Other | Source: Ambulatory Visit | Attending: Radiation Oncology | Admitting: Radiation Oncology

## 2020-02-18 DIAGNOSIS — Z510 Encounter for antineoplastic radiation therapy: Secondary | ICD-10-CM | POA: Diagnosis not present

## 2020-02-19 ENCOUNTER — Ambulatory Visit
Admission: RE | Admit: 2020-02-19 | Discharge: 2020-02-19 | Disposition: A | Discharge status: Home / Self Care | Payer: Medicare Other | Source: Ambulatory Visit | Attending: Radiation Oncology | Admitting: Radiation Oncology

## 2020-02-19 DIAGNOSIS — Z510 Encounter for antineoplastic radiation therapy: Secondary | ICD-10-CM | POA: Diagnosis not present

## 2020-02-20 ENCOUNTER — Inpatient Hospital Stay: Payer: Medicare Other

## 2020-02-20 ENCOUNTER — Ambulatory Visit
Admission: RE | Admit: 2020-02-20 | Discharge: 2020-02-20 | Disposition: A | Discharge status: Home / Self Care | Payer: Medicare Other | Source: Ambulatory Visit | Attending: Radiation Oncology | Admitting: Radiation Oncology

## 2020-02-20 ENCOUNTER — Other Ambulatory Visit: Payer: Self-pay

## 2020-02-20 ENCOUNTER — Telehealth: Payer: Self-pay

## 2020-02-20 DIAGNOSIS — C760 Malignant neoplasm of head, face and neck: Secondary | ICD-10-CM | POA: Diagnosis not present

## 2020-02-20 DIAGNOSIS — Z510 Encounter for antineoplastic radiation therapy: Secondary | ICD-10-CM | POA: Diagnosis not present

## 2020-02-20 LAB — CBC
HCT: 35.5 % — ABNORMAL LOW (ref 36.0–46.0)
Hemoglobin: 11.5 g/dL — ABNORMAL LOW (ref 12.0–15.0)
MCH: 30.4 pg (ref 26.0–34.0)
MCHC: 32.4 g/dL (ref 30.0–36.0)
MCV: 93.9 fL (ref 80.0–100.0)
Platelets: 276 10*3/uL (ref 150–400)
RBC: 3.78 MIL/uL — ABNORMAL LOW (ref 3.87–5.11)
RDW: 13.9 % (ref 11.5–15.5)
WBC: 11.2 10*3/uL — ABNORMAL HIGH (ref 4.0–10.5)
nRBC: 0 % (ref 0.0–0.2)

## 2020-02-20 NOTE — Telephone Encounter (Signed)
error 

## 2020-02-21 ENCOUNTER — Ambulatory Visit
Admission: RE | Admit: 2020-02-21 | Discharge: 2020-02-21 | Disposition: A | Discharge status: Home / Self Care | Payer: Medicare Other | Source: Ambulatory Visit | Attending: Radiation Oncology | Admitting: Radiation Oncology

## 2020-02-21 DIAGNOSIS — Z510 Encounter for antineoplastic radiation therapy: Secondary | ICD-10-CM | POA: Diagnosis not present

## 2020-02-24 ENCOUNTER — Ambulatory Visit
Admission: RE | Admit: 2020-02-24 | Discharge: 2020-02-24 | Disposition: A | Discharge status: Home / Self Care | Payer: Medicare Other | Source: Ambulatory Visit | Attending: Radiation Oncology | Admitting: Radiation Oncology

## 2020-02-24 DIAGNOSIS — Z510 Encounter for antineoplastic radiation therapy: Secondary | ICD-10-CM | POA: Diagnosis not present

## 2020-02-25 ENCOUNTER — Other Ambulatory Visit: Payer: Self-pay | Admitting: Oncology

## 2020-02-25 ENCOUNTER — Ambulatory Visit
Admission: RE | Admit: 2020-02-25 | Discharge: 2020-02-25 | Disposition: A | Discharge status: Home / Self Care | Payer: Medicare Other | Source: Ambulatory Visit | Attending: Radiation Oncology | Admitting: Radiation Oncology

## 2020-02-25 ENCOUNTER — Other Ambulatory Visit: Payer: Self-pay | Admitting: *Deleted

## 2020-02-25 ENCOUNTER — Telehealth: Payer: Self-pay

## 2020-02-25 DIAGNOSIS — Z510 Encounter for antineoplastic radiation therapy: Secondary | ICD-10-CM | POA: Diagnosis not present

## 2020-02-25 MED ORDER — FERROUS SULFATE 325 (65 FE) MG PO TBEC: 325.0000 mg | DELAYED_RELEASE_TABLET | Freq: Two times a day (BID) | ORAL | 1 refills | Status: DC

## 2020-02-25 MED ORDER — SUCRALFATE 1 G PO TABS: 1.0000 g | ORAL_TABLET | Freq: Three times a day (TID) | ORAL | 3 refills | Status: DC

## 2020-02-25 NOTE — Telephone Encounter (Signed)
-----   Message from Earlie Server, MD sent at 02/25/2020  8:20 AM EDT ----- Please let her know that she has low blood counts due to low iron level and I recommend her to take ferrous sulfate twice daily with meals. . Rx sent to pharmacy.  If she gets constipation, advise otc stool softener.  Follow up lab in 2 months, prior to appointment. Please order cbc cmp iron tibc ferritin. Thanks.

## 2020-02-25 NOTE — Telephone Encounter (Signed)
Done.. Pt has been scheduled to RTC in 2 mths for lab/MD visit As requested

## 2020-02-25 NOTE — Telephone Encounter (Signed)
Pt notified of Dr. Collie Siad recommendations and appt details.

## 2020-02-26 ENCOUNTER — Ambulatory Visit
Admission: RE | Admit: 2020-02-26 | Discharge: 2020-02-26 | Disposition: A | Discharge status: Home / Self Care | Payer: Medicare Other | Source: Ambulatory Visit | Attending: Radiation Oncology | Admitting: Radiation Oncology

## 2020-02-26 DIAGNOSIS — Z510 Encounter for antineoplastic radiation therapy: Secondary | ICD-10-CM | POA: Diagnosis not present

## 2020-02-27 ENCOUNTER — Other Ambulatory Visit: Payer: Self-pay

## 2020-02-27 ENCOUNTER — Ambulatory Visit
Admission: RE | Admit: 2020-02-27 | Discharge: 2020-02-27 | Disposition: A | Discharge status: Home / Self Care | Payer: Medicare Other | Source: Ambulatory Visit | Attending: Radiation Oncology | Admitting: Radiation Oncology

## 2020-02-27 ENCOUNTER — Inpatient Hospital Stay: Payer: Medicare Other

## 2020-02-27 DIAGNOSIS — Z510 Encounter for antineoplastic radiation therapy: Secondary | ICD-10-CM | POA: Diagnosis not present

## 2020-02-27 DIAGNOSIS — C760 Malignant neoplasm of head, face and neck: Secondary | ICD-10-CM | POA: Diagnosis not present

## 2020-02-27 LAB — CBC
HCT: 35 % — ABNORMAL LOW (ref 36.0–46.0)
Hemoglobin: 11.2 g/dL — ABNORMAL LOW (ref 12.0–15.0)
MCH: 29.9 pg (ref 26.0–34.0)
MCHC: 32 g/dL (ref 30.0–36.0)
MCV: 93.6 fL (ref 80.0–100.0)
Platelets: 253 10*3/uL (ref 150–400)
RBC: 3.74 MIL/uL — ABNORMAL LOW (ref 3.87–5.11)
RDW: 13.9 % (ref 11.5–15.5)
WBC: 10.8 10*3/uL — ABNORMAL HIGH (ref 4.0–10.5)
nRBC: 0 % (ref 0.0–0.2)

## 2020-02-28 ENCOUNTER — Ambulatory Visit
Admission: RE | Admit: 2020-02-28 | Discharge: 2020-02-28 | Disposition: A | Discharge status: Home / Self Care | Payer: Medicare Other | Source: Ambulatory Visit | Attending: Radiation Oncology | Admitting: Radiation Oncology

## 2020-02-28 DIAGNOSIS — Z510 Encounter for antineoplastic radiation therapy: Secondary | ICD-10-CM | POA: Diagnosis not present

## 2020-03-01 ENCOUNTER — Encounter: Payer: Self-pay | Admitting: Oncology

## 2020-03-01 DIAGNOSIS — Z7189 Other specified counseling: Secondary | ICD-10-CM | POA: Insufficient documentation

## 2020-03-02 ENCOUNTER — Ambulatory Visit: Payer: Medicare Other

## 2020-03-03 ENCOUNTER — Other Ambulatory Visit: Payer: Self-pay | Admitting: Licensed Clinical Social Worker

## 2020-03-03 ENCOUNTER — Ambulatory Visit
Admission: RE | Admit: 2020-03-03 | Discharge: 2020-03-03 | Disposition: A | Discharge status: Home / Self Care | Payer: Medicare Other | Source: Ambulatory Visit | Attending: Radiation Oncology | Admitting: Radiation Oncology

## 2020-03-03 DIAGNOSIS — Z510 Encounter for antineoplastic radiation therapy: Secondary | ICD-10-CM | POA: Insufficient documentation

## 2020-03-03 DIAGNOSIS — C760 Malignant neoplasm of head, face and neck: Secondary | ICD-10-CM

## 2020-03-03 DIAGNOSIS — C321 Malignant neoplasm of supraglottis: Secondary | ICD-10-CM | POA: Diagnosis present

## 2020-03-03 MED ORDER — DEXAMETHASONE 2 MG PO TABS: 2.0000 mg | ORAL_TABLET | Freq: Two times a day (BID) | ORAL | 0 refills | Status: DC

## 2020-03-04 ENCOUNTER — Ambulatory Visit
Admission: RE | Admit: 2020-03-04 | Discharge: 2020-03-04 | Disposition: A | Discharge status: Home / Self Care | Payer: Medicare Other | Source: Ambulatory Visit | Attending: Radiation Oncology | Admitting: Radiation Oncology

## 2020-03-04 DIAGNOSIS — Z510 Encounter for antineoplastic radiation therapy: Secondary | ICD-10-CM | POA: Diagnosis not present

## 2020-03-05 ENCOUNTER — Encounter (INDEPENDENT_AMBULATORY_CARE_PROVIDER_SITE_OTHER): Payer: Self-pay

## 2020-03-05 ENCOUNTER — Other Ambulatory Visit: Payer: Self-pay

## 2020-03-05 ENCOUNTER — Ambulatory Visit
Admission: RE | Admit: 2020-03-05 | Discharge: 2020-03-05 | Disposition: A | Discharge status: Home / Self Care | Payer: Medicare Other | Source: Ambulatory Visit | Attending: Radiation Oncology | Admitting: Radiation Oncology

## 2020-03-05 ENCOUNTER — Inpatient Hospital Stay: Payer: Medicare Other | Attending: Oncology

## 2020-03-05 DIAGNOSIS — Z510 Encounter for antineoplastic radiation therapy: Secondary | ICD-10-CM | POA: Diagnosis not present

## 2020-03-05 DIAGNOSIS — C760 Malignant neoplasm of head, face and neck: Secondary | ICD-10-CM | POA: Insufficient documentation

## 2020-03-05 LAB — CBC
HCT: 34.9 % — ABNORMAL LOW (ref 36.0–46.0)
Hemoglobin: 11.3 g/dL — ABNORMAL LOW (ref 12.0–15.0)
MCH: 30.3 pg (ref 26.0–34.0)
MCHC: 32.4 g/dL (ref 30.0–36.0)
MCV: 93.6 fL (ref 80.0–100.0)
Platelets: 277 10*3/uL (ref 150–400)
RBC: 3.73 MIL/uL — ABNORMAL LOW (ref 3.87–5.11)
RDW: 14.7 % (ref 11.5–15.5)
WBC: 13.3 10*3/uL — ABNORMAL HIGH (ref 4.0–10.5)
nRBC: 0 % (ref 0.0–0.2)

## 2020-03-06 ENCOUNTER — Ambulatory Visit
Admission: RE | Admit: 2020-03-06 | Discharge: 2020-03-06 | Disposition: A | Discharge status: Home / Self Care | Payer: Medicare Other | Source: Ambulatory Visit | Attending: Radiation Oncology | Admitting: Radiation Oncology

## 2020-03-06 DIAGNOSIS — Z510 Encounter for antineoplastic radiation therapy: Secondary | ICD-10-CM | POA: Diagnosis not present

## 2020-03-09 ENCOUNTER — Ambulatory Visit
Admission: RE | Admit: 2020-03-09 | Discharge: 2020-03-09 | Disposition: A | Discharge status: Home / Self Care | Payer: Medicare Other | Source: Ambulatory Visit | Attending: Radiation Oncology | Admitting: Radiation Oncology

## 2020-03-09 DIAGNOSIS — Z510 Encounter for antineoplastic radiation therapy: Secondary | ICD-10-CM | POA: Diagnosis not present

## 2020-03-10 ENCOUNTER — Ambulatory Visit
Admission: RE | Admit: 2020-03-10 | Discharge: 2020-03-10 | Disposition: A | Discharge status: Home / Self Care | Payer: Medicare Other | Source: Ambulatory Visit | Attending: Radiation Oncology | Admitting: Radiation Oncology

## 2020-03-10 DIAGNOSIS — Z510 Encounter for antineoplastic radiation therapy: Secondary | ICD-10-CM | POA: Diagnosis not present

## 2020-03-11 ENCOUNTER — Ambulatory Visit
Admission: RE | Admit: 2020-03-11 | Discharge: 2020-03-11 | Disposition: A | Discharge status: Home / Self Care | Payer: Medicare Other | Source: Ambulatory Visit | Attending: Radiation Oncology | Admitting: Radiation Oncology

## 2020-03-11 DIAGNOSIS — Z510 Encounter for antineoplastic radiation therapy: Secondary | ICD-10-CM | POA: Diagnosis not present

## 2020-03-12 ENCOUNTER — Ambulatory Visit
Admission: RE | Admit: 2020-03-12 | Discharge: 2020-03-12 | Disposition: A | Discharge status: Home / Self Care | Payer: Medicare Other | Source: Ambulatory Visit | Attending: Radiation Oncology | Admitting: Radiation Oncology

## 2020-03-12 ENCOUNTER — Encounter (INDEPENDENT_AMBULATORY_CARE_PROVIDER_SITE_OTHER): Payer: Self-pay

## 2020-03-12 ENCOUNTER — Inpatient Hospital Stay: Payer: Medicare Other

## 2020-03-12 ENCOUNTER — Other Ambulatory Visit: Payer: Self-pay

## 2020-03-12 DIAGNOSIS — C760 Malignant neoplasm of head, face and neck: Secondary | ICD-10-CM

## 2020-03-12 DIAGNOSIS — Z510 Encounter for antineoplastic radiation therapy: Secondary | ICD-10-CM | POA: Diagnosis not present

## 2020-03-12 LAB — CBC
HCT: 38.4 % (ref 36.0–46.0)
Hemoglobin: 12.5 g/dL (ref 12.0–15.0)
MCH: 30.7 pg (ref 26.0–34.0)
MCHC: 32.6 g/dL (ref 30.0–36.0)
MCV: 94.3 fL (ref 80.0–100.0)
Platelets: 312 10*3/uL (ref 150–400)
RBC: 4.07 MIL/uL (ref 3.87–5.11)
RDW: 15.2 % (ref 11.5–15.5)
WBC: 16.6 10*3/uL — ABNORMAL HIGH (ref 4.0–10.5)
nRBC: 0 % (ref 0.0–0.2)

## 2020-03-13 ENCOUNTER — Ambulatory Visit
Admission: RE | Admit: 2020-03-13 | Discharge: 2020-03-13 | Disposition: A | Discharge status: Home / Self Care | Payer: Medicare Other | Source: Ambulatory Visit | Attending: Radiation Oncology | Admitting: Radiation Oncology

## 2020-03-13 DIAGNOSIS — Z510 Encounter for antineoplastic radiation therapy: Secondary | ICD-10-CM | POA: Diagnosis not present

## 2020-03-14 ENCOUNTER — Ambulatory Visit: Payer: Medicare Other

## 2020-03-16 ENCOUNTER — Emergency Department
Admission: EM | Admit: 2020-03-16 | Discharge: 2020-03-16 | Disposition: A | Discharge status: Home / Self Care | Payer: Medicare Other | Attending: Student | Admitting: Student

## 2020-03-16 ENCOUNTER — Ambulatory Visit: Payer: Medicare Other

## 2020-03-16 ENCOUNTER — Other Ambulatory Visit: Payer: Self-pay

## 2020-03-16 ENCOUNTER — Encounter: Payer: Self-pay | Admitting: Emergency Medicine

## 2020-03-16 DIAGNOSIS — F17210 Nicotine dependence, cigarettes, uncomplicated: Secondary | ICD-10-CM | POA: Diagnosis not present

## 2020-03-16 DIAGNOSIS — J029 Acute pharyngitis, unspecified: Secondary | ICD-10-CM | POA: Insufficient documentation

## 2020-03-16 DIAGNOSIS — I2510 Atherosclerotic heart disease of native coronary artery without angina pectoris: Secondary | ICD-10-CM | POA: Insufficient documentation

## 2020-03-16 DIAGNOSIS — I10 Essential (primary) hypertension: Secondary | ICD-10-CM | POA: Diagnosis not present

## 2020-03-16 DIAGNOSIS — J449 Chronic obstructive pulmonary disease, unspecified: Secondary | ICD-10-CM | POA: Diagnosis not present

## 2020-03-16 DIAGNOSIS — Z79899 Other long term (current) drug therapy: Secondary | ICD-10-CM | POA: Insufficient documentation

## 2020-03-16 DIAGNOSIS — E039 Hypothyroidism, unspecified: Secondary | ICD-10-CM | POA: Insufficient documentation

## 2020-03-16 LAB — GROUP A STREP BY PCR: Group A Strep by PCR: NOT DETECTED

## 2020-03-16 MED ORDER — MAGIC MOUTHWASH
10.0000 mL | Freq: Four times a day (QID) | ORAL | 0 refills | Status: DC
Start: 2020-03-16 — End: 2020-03-16

## 2020-03-16 MED ORDER — MAGIC MOUTHWASH
ORAL | 0 refills | Status: DC
Start: 2020-03-16 — End: 2020-04-27

## 2020-03-16 NOTE — ED Notes (Signed)
See triage note  Presents with sore throat since Saturday  Having increased pain with swallowing  No fever  She has several areas of white noted to back of throat

## 2020-03-16 NOTE — ED Provider Notes (Signed)
[facility] Emergency Department Provider Note  ____________________________________________   First MD Initiated Contact with Patient 03/16/20 [id number]     (approximate)  I have reviewed the triage vital signs and the nursing notes.   HISTORY  Chief Complaint Sore Throat   HPI Maria Warren is a 64 y.o. female presents to the ED with complaint of sore throat for the last 2 days.  Patient states that she has seen some white patches on the back of her throat.  She denies any fever or chills.  She denies any exposure to strep throat.  Currently she is undergoing radiation therapy to her larynx and goes 5 days a week.  She states that pain is increased with swallowing but has continued to maintain saliva and drink fluids.  She rates her pain as a 10/10.      Past Medical History:  Diagnosis Date  . Anxiety   . Arthritis    knees, lower back  . Cancer (HCC)    larygeal  . Carotid stenosis   . COPD (chronic obstructive pulmonary disease) ([place])   . Coronary artery disease   . Depression   . GERD (gastroesophageal reflux disease)   . Headache    Pain over left eye, then vision in left eye goes black, quickly resolves.  Said Dr. Melony Overly office aware.  . Hypertension   . Hypothyroidism   . Lower extremity edema   . Thyroid disease   . Wears dentures    full upper    Patient Active Problem List   Diagnosis Date Noted  . Goals of care, counseling/discussion 03/01/2020  . Carotid stenosis 01/25/2019  . Status post reverse arthroplasty of shoulder, left 04/26/2018  . Osteoarthritis of left knee 01/29/2018  . Adjustment disorder with mixed disturbance of emotions and conduct 08/09/2017  . Cocaine abuse ([place]) 08/09/2017  . Tobacco use disorder 02/17/2016  . Major depressive disorder, recurrent severe without psychotic features ([place]) 02/16/2016  . Cannabis use disorder, moderate, dependence ([place]) 02/16/2016  . Hypertension 02/16/2016  . Hypothyroidism  02/16/2016  . Coronary artery disease 02/16/2016  . Suicidal ideation 02/16/2016    Past Surgical History:  Procedure Laterality Date  . CARDIAC CATHETERIZATION    . CATARACT EXTRACTION W/PHACO Right 03/07/2017   Procedure: CATARACT EXTRACTION PHACO AND INTRAOCULAR LENS PLACEMENT ([place])  Right;  Surgeon: Eulogio Bear, MD;  Location: [place];  Service: Ophthalmology;  Laterality: Right;  . CATARACT EXTRACTION W/PHACO Left 04/11/2017   Procedure: CATARACT EXTRACTION PHACO AND INTRAOCULAR LENS PLACEMENT ([place])  left;  Surgeon: Eulogio Bear, MD;  Location: [place];  Service: Ophthalmology;  Laterality: Left;  . CESAREAN SECTION     x 2  . COLONOSCOPY N/A 11/24/2015   Procedure: COLONOSCOPY;  Surgeon: Lollie Sails, MD;  Location: [facility] ENDOSCOPY;  Service: Endoscopy;  Laterality: N/A;  . ESOPHAGOGASTRODUODENOSCOPY (EGD) WITH PROPOFOL N/A 11/24/2015   Procedure: ESOPHAGOGASTRODUODENOSCOPY (EGD) WITH PROPOFOL;  Surgeon: Lollie Sails, MD;  Location: [facility] ENDOSCOPY;  Service: Endoscopy;  Laterality: N/A;  . EYE SURGERY    . JOINT REPLACEMENT    . LEFT HEART CATH AND CORONARY ANGIOGRAPHY Left 01/11/2017   Procedure: Left Heart Cath and Coronary Angiography;  Surgeon: Corey Skains, MD;  Location: [place] CV LAB;  Service: Cardiovascular;  Laterality: Left;  Marland Kitchen MICROLARYNGOSCOPY Left 01/14/2020   Procedure: Suspension Microlaryngoscopy with biopsy;  Surgeon: Carloyn Manner, MD;  Location: [facility] ORS;  Service: ENT;  Laterality: Left;  .  REVERSE SHOULDER ARTHROPLASTY Left 04/26/2018   Procedure: REVERSE SHOULDER ARTHROPLASTY;  Surgeon: Corky Mull, MD;  Location: [facility] ORS;  Service: Orthopedics;  Laterality: Left;  . TOTAL KNEE ARTHROPLASTY Left 01/29/2018   Procedure: TOTAL KNEE ARTHROPLASTY;  Surgeon: Lovell Sheehan, MD;  Location: [facility] ORS;  Service: Orthopedics;  Laterality: Left;  . TOTAL THYROIDECTOMY    . TUBAL LIGATION      Prior to Admission  medications   Medication Sig Start Date End Date Taking? Authorizing Provider  acetaminophen (TYLENOL) 650 MG CR tablet Take 650-1,300 mg by mouth every 8 (eight) hours as needed for pain.    [provider]  aspirin EC 81 MG tablet Take 81 mg by mouth daily.    [provider]  atorvastatin (LIPITOR) 80 MG tablet Take 80 mg by mouth every evening.     [provider]  B Complex-C (B-COMPLEX WITH VITAMIN C) tablet Take 1 tablet by mouth daily.    [provider]  Biotin 1000 MCG tablet Take 500 mcg by mouth daily.    [provider]  calcitRIOL (ROCALTROL) 0.5 MCG capsule Take 1 mcg by mouth in the morning and at bedtime.    [provider]  Cholecalciferol (VITAMIN D3) 50 MCG (2000 UT) TABS Take 2,000 Units by mouth daily.    [provider]  dexamethasone (DECADRON) 2 MG tablet Take 1 tablet (2 mg total) by mouth 2 (two) times daily. 03/03/20   Noreene Filbert, MD  diphenhydrAMINE (BENADRYL) 25 mg capsule Take 25 mg by mouth at bedtime as needed (for nose/watery eyes).     [provider]  ferrous sulfate 325 (65 FE) MG EC tablet Take 1 tablet (325 mg total) by mouth 2 (two) times daily with a meal. 02/25/20   Earlie Server, MD  isosorbide mononitrate (IMDUR) 30 MG 24 hr tablet Take 30 mg by mouth daily.    [provider]  levothyroxine (SYNTHROID, LEVOTHROID) 125 MCG tablet Take 125 mcg by mouth daily before breakfast. 03/07/18   [provider]  magic mouthwash SOLN Take 10 ml four times a day, swish and swollow 03/16/20   Johnn Hai, PA-C  nitroGLYCERIN (NITROSTAT) 0.4 MG SL tablet Place 0.4 mg under the tongue every 5 (five) minutes as needed for chest pain.    [provider]  ondansetron (ZOFRAN) 4 MG tablet Take 1 tablet (4 mg total) by mouth every 8 (eight) hours as needed for up to 10 doses for nausea or vomiting. 01/14/20   Vaught, Jeannie Fend, MD  pantoprazole (PROTONIX) 40 MG tablet Take 40 mg  by mouth daily before breakfast.    [provider]  sucralfate (CARAFATE) 1 g tablet Take 1 tablet (1 g total) by mouth 3 (three) times daily. 02/25/20   Noreene Filbert, MD  tiotropium (SPIRIVA) 18 MCG inhalation capsule Place 18 mcg into inhaler and inhale daily.    [provider]  vitamin B-12 (CYANOCOBALAMIN) 500 MCG tablet Take 1,000 mcg by mouth daily.    [provider]  vitamin E 180 MG (400 UNITS) capsule Take 400 Units by mouth daily.    [provider]    Allergies Topiramate, Lyrica [pregabalin], Zoloft [sertraline hcl], and Bupropion  Family History  Problem Relation Age of Onset  . Breast cancer Maternal Aunt   . Heart failure Mother   . Arthritis Mother   . Heart attack Father     Social History Social History   Tobacco Use  .  Smoking status: Current Every Day Smoker    Packs/day: 0.50    Years: 25.00    Pack years: 12.50    Types: Cigarettes  . Smokeless tobacco: Never Used  Substance Use Topics  . Alcohol use: Yes    Alcohol/week: 12.0 standard drinks    Types: 12 Cans of beer per week    Comment: 2 cans a night   . Drug use: Not Currently    Types: Cocaine, Marijuana    Comment: drug use in 1980s    Review of Systems Constitutional: No fever/chills Eyes: No visual changes. ENT: Positive sore throat Cardiovascular: Denies chest pain. Respiratory: Denies shortness of breath.  Negative for cough. Gastrointestinal: No abdominal pain.  No nausea, no vomiting.  No diarrhea.   Musculoskeletal: Negative for muscle aches. Skin: Negative for rash. Neurological: Negative for headaches, focal weakness or numbness. ____________________________________________   PHYSICAL EXAM:  VITAL SIGNS: ED Triage Vitals  Enc Vitals Group     BP 03/16/20 0823 (!) 164/77     Pulse Rate 03/16/20 0823 67     Resp 03/16/20 0823 16     Temp 03/16/20 0823 98.8 F (37.1 C)     Temp Source 03/16/20 0823 Oral     SpO2 03/16/20 0823 100 %      Weight 03/16/20 0825 197 lb 8.5 oz (89.6 kg)     Height 03/16/20 0825 5\' 3"  (1.6 m)     Head Circumference --      Peak Flow --      Pain Score 03/16/20 0825 10     Pain Loc --      Pain Edu? --      Excl. in [place]? --     Constitutional: Alert and oriented. Well appearing and in no acute distress. Eyes: Conjunctivae are normal.  Head: Atraumatic. Nose: No congestion/rhinnorhea. Mouth/Throat: Mucous membranes are moist.  Oropharynx non-erythematous.  There are patches of whitish material bilateral tonsillar area and on the soft palate.  No swelling or difficulty swallowing, talking or breathing is noted.  No patches are seen on the tongue. Neck: No stridor.   Hematological/Lymphatic/Immunilogical: No cervical lymphadenopathy. Cardiovascular: Normal rate, regular rhythm. Grossly normal heart sounds.  Good peripheral circulation. Respiratory: Normal respiratory effort.  No retractions. Lungs CTAB. Musculoskeletal: Moves upper and lower extremities any difficulty normal gait was noted.. Neurologic:  Normal speech and language. No gross focal neurologic deficits are appreciated. No gait instability. Skin:  Skin is warm, dry and intact. No rash noted. Psychiatric: Mood and affect are normal. Speech and behavior are normal.  ____________________________________________   LABS (all labs ordered are listed, but only abnormal results are displayed)  Labs Reviewed  GROUP A STREP BY PCR     PROCEDURES  Procedure(s) performed (including Critical Care):  Procedures  ____________________________________________   INITIAL IMPRESSION / ASSESSMENT AND PLAN / ED COURSE  As part of my medical decision making, I reviewed the following data within the electronic MEDICAL RECORD NUMBER Notes from prior ED visits and [place] Controlled Substance Database  64 year old female presents to the ED with complaint of sore throat for the last 2 days.  Patient denies any fever or chills.  She is unaware of any  exposure to strep throat.  Currently she is doing radiation therapy to her larynx.  She still continues to eat and drink and reports no difficulty talking.  On exam areas are suspicious for a candidiasis.  A prescription for Magic mouthwash containing Benadryl, Maalox and nystatin was  sent to her pharmacy.  She also is encouraged to keep her appointment with her doctor tomorrow for a follow-up.  She is return to the emergency department if any severe worsening of her symptoms or should she began developing fever or chills.  ____________________________________________   FINAL CLINICAL IMPRESSION(S) / ED DIAGNOSES  Final diagnoses:  Pharyngitis, unspecified etiology     ED Discharge Orders         Ordered    magic mouthwash SOLN  4 times daily,   Status:  Discontinued     03/16/20 1016    magic mouthwash SOLN     03/16/20 1025           Note:  This document was prepared using Dragon voice recognition software and may include unintentional dictation errors.    Johnn Hai, PA-C 03/16/20 1503    Lilia Pro., MD 03/17/20 2130

## 2020-03-16 NOTE — Discharge Instructions (Signed)
Keep your appointment with your doctor tomorrow.  You may also need to follow-up with [facility] if not improving.  Begin using the mouthwash today.  It is 2 teaspoons 4 times a day to swish around in your mouth and then swallow.  If any worsening of your symptoms or you develop fever and chills return to the emergency department.

## 2020-03-16 NOTE — ED Triage Notes (Signed)
Currently having radiation to larynx for one month.  Arrives today with c/o sore throat.

## 2020-03-17 ENCOUNTER — Ambulatory Visit: Admission: RE | Admit: 2020-03-17 | Payer: Medicare Other | Source: Ambulatory Visit

## 2020-03-17 ENCOUNTER — Other Ambulatory Visit: Payer: Self-pay | Admitting: *Deleted

## 2020-03-17 ENCOUNTER — Ambulatory Visit: Payer: Medicare Other

## 2020-03-17 MED ORDER — FLUCONAZOLE 200 MG PO TABS
200.0000 mg | ORAL_TABLET | Freq: Every day | ORAL | 0 refills | Status: DC
Start: 2020-03-17 — End: 2020-04-03

## 2020-03-18 ENCOUNTER — Ambulatory Visit: Payer: Medicare Other

## 2020-03-19 ENCOUNTER — Ambulatory Visit: Payer: Medicare Other

## 2020-03-19 ENCOUNTER — Inpatient Hospital Stay: Payer: Medicare Other

## 2020-03-20 ENCOUNTER — Ambulatory Visit: Payer: Medicare Other

## 2020-03-23 ENCOUNTER — Ambulatory Visit
Admission: RE | Admit: 2020-03-23 | Discharge: 2020-03-23 | Disposition: A | Discharge status: Home / Self Care | Payer: Medicare Other | Source: Ambulatory Visit | Attending: Radiation Oncology | Admitting: Radiation Oncology

## 2020-03-23 DIAGNOSIS — Z510 Encounter for antineoplastic radiation therapy: Secondary | ICD-10-CM | POA: Diagnosis not present

## 2020-03-24 ENCOUNTER — Ambulatory Visit
Admission: RE | Admit: 2020-03-24 | Discharge: 2020-03-24 | Disposition: A | Discharge status: Home / Self Care | Payer: Medicare Other | Source: Ambulatory Visit | Attending: Radiation Oncology | Admitting: Radiation Oncology

## 2020-03-24 DIAGNOSIS — Z510 Encounter for antineoplastic radiation therapy: Secondary | ICD-10-CM | POA: Diagnosis not present

## 2020-03-25 ENCOUNTER — Ambulatory Visit
Admission: RE | Admit: 2020-03-25 | Discharge: 2020-03-25 | Disposition: A | Discharge status: Home / Self Care | Payer: Medicare Other | Source: Ambulatory Visit | Attending: Radiation Oncology | Admitting: Radiation Oncology

## 2020-03-25 DIAGNOSIS — Z510 Encounter for antineoplastic radiation therapy: Secondary | ICD-10-CM | POA: Diagnosis not present

## 2020-03-26 ENCOUNTER — Ambulatory Visit
Admission: RE | Admit: 2020-03-26 | Discharge: 2020-03-26 | Disposition: A | Discharge status: Home / Self Care | Payer: Medicare Other | Source: Ambulatory Visit | Attending: Radiation Oncology | Admitting: Radiation Oncology

## 2020-03-26 ENCOUNTER — Inpatient Hospital Stay: Payer: Medicare Other

## 2020-03-26 DIAGNOSIS — Z510 Encounter for antineoplastic radiation therapy: Secondary | ICD-10-CM | POA: Diagnosis not present

## 2020-03-27 ENCOUNTER — Ambulatory Visit
Admission: RE | Admit: 2020-03-27 | Discharge: 2020-03-27 | Disposition: A | Discharge status: Home / Self Care | Payer: Medicare Other | Source: Ambulatory Visit | Attending: Radiation Oncology | Admitting: Radiation Oncology

## 2020-03-27 ENCOUNTER — Other Ambulatory Visit: Payer: Self-pay | Admitting: *Deleted

## 2020-03-27 ENCOUNTER — Inpatient Hospital Stay: Payer: Medicare Other | Attending: Family Medicine

## 2020-03-27 DIAGNOSIS — Z510 Encounter for antineoplastic radiation therapy: Secondary | ICD-10-CM | POA: Diagnosis not present

## 2020-03-27 MED ORDER — ALPRAZOLAM 0.25 MG PO TABS
0.2500 mg | ORAL_TABLET | Freq: Every day | ORAL | 0 refills | Status: DC | PRN
Start: 2020-03-27 — End: 2020-05-14

## 2020-03-31 ENCOUNTER — Ambulatory Visit: Payer: Medicare Other

## 2020-03-31 ENCOUNTER — Ambulatory Visit
Admission: RE | Admit: 2020-03-31 | Discharge: 2020-03-31 | Disposition: A | Discharge status: Home / Self Care | Payer: Medicare Other | Source: Ambulatory Visit | Attending: Radiation Oncology | Admitting: Radiation Oncology

## 2020-03-31 ENCOUNTER — Inpatient Hospital Stay: Payer: Medicare Other | Attending: Family Medicine

## 2020-03-31 ENCOUNTER — Other Ambulatory Visit: Payer: Self-pay | Admitting: *Deleted

## 2020-03-31 DIAGNOSIS — C321 Malignant neoplasm of supraglottis: Secondary | ICD-10-CM | POA: Diagnosis present

## 2020-03-31 DIAGNOSIS — F17210 Nicotine dependence, cigarettes, uncomplicated: Secondary | ICD-10-CM | POA: Insufficient documentation

## 2020-03-31 DIAGNOSIS — E538 Deficiency of other specified B group vitamins: Secondary | ICD-10-CM | POA: Insufficient documentation

## 2020-03-31 DIAGNOSIS — Z7289 Other problems related to lifestyle: Secondary | ICD-10-CM | POA: Insufficient documentation

## 2020-03-31 DIAGNOSIS — Z510 Encounter for antineoplastic radiation therapy: Secondary | ICD-10-CM | POA: Diagnosis present

## 2020-03-31 DIAGNOSIS — Z923 Personal history of irradiation: Secondary | ICD-10-CM | POA: Insufficient documentation

## 2020-03-31 DIAGNOSIS — K1230 Oral mucositis (ulcerative), unspecified: Secondary | ICD-10-CM | POA: Insufficient documentation

## 2020-03-31 DIAGNOSIS — C760 Malignant neoplasm of head, face and neck: Secondary | ICD-10-CM | POA: Insufficient documentation

## 2020-03-31 DIAGNOSIS — D649 Anemia, unspecified: Secondary | ICD-10-CM | POA: Insufficient documentation

## 2020-04-01 ENCOUNTER — Ambulatory Visit: Payer: Medicare Other

## 2020-04-01 ENCOUNTER — Ambulatory Visit
Admission: RE | Admit: 2020-04-01 | Discharge: 2020-04-01 | Disposition: A | Discharge status: Home / Self Care | Payer: Medicare Other | Source: Ambulatory Visit | Attending: Radiation Oncology | Admitting: Radiation Oncology

## 2020-04-01 ENCOUNTER — Inpatient Hospital Stay: Payer: Medicare Other

## 2020-04-01 DIAGNOSIS — Z510 Encounter for antineoplastic radiation therapy: Secondary | ICD-10-CM | POA: Diagnosis not present

## 2020-04-02 ENCOUNTER — Ambulatory Visit
Admission: RE | Admit: 2020-04-02 | Discharge: 2020-04-02 | Disposition: A | Discharge status: Home / Self Care | Payer: Medicare Other | Source: Ambulatory Visit | Attending: Radiation Oncology | Admitting: Radiation Oncology

## 2020-04-02 ENCOUNTER — Inpatient Hospital Stay: Payer: Medicare Other

## 2020-04-02 ENCOUNTER — Ambulatory Visit: Payer: Medicare Other

## 2020-04-02 ENCOUNTER — Other Ambulatory Visit: Payer: Self-pay

## 2020-04-02 DIAGNOSIS — E538 Deficiency of other specified B group vitamins: Secondary | ICD-10-CM | POA: Diagnosis not present

## 2020-04-02 DIAGNOSIS — K1230 Oral mucositis (ulcerative), unspecified: Secondary | ICD-10-CM | POA: Diagnosis not present

## 2020-04-02 DIAGNOSIS — Z7289 Other problems related to lifestyle: Secondary | ICD-10-CM | POA: Diagnosis not present

## 2020-04-02 DIAGNOSIS — C760 Malignant neoplasm of head, face and neck: Secondary | ICD-10-CM

## 2020-04-02 DIAGNOSIS — D649 Anemia, unspecified: Secondary | ICD-10-CM | POA: Diagnosis not present

## 2020-04-02 DIAGNOSIS — Z923 Personal history of irradiation: Secondary | ICD-10-CM | POA: Diagnosis not present

## 2020-04-02 DIAGNOSIS — Z510 Encounter for antineoplastic radiation therapy: Secondary | ICD-10-CM | POA: Diagnosis not present

## 2020-04-02 DIAGNOSIS — F17210 Nicotine dependence, cigarettes, uncomplicated: Secondary | ICD-10-CM | POA: Diagnosis not present

## 2020-04-02 LAB — CBC
HCT: 35 % — ABNORMAL LOW (ref 36.0–46.0)
Hemoglobin: 11.7 g/dL — ABNORMAL LOW (ref 12.0–15.0)
MCH: 31.7 pg (ref 26.0–34.0)
MCHC: 33.4 g/dL (ref 30.0–36.0)
MCV: 94.9 fL (ref 80.0–100.0)
Platelets: 200 10*3/uL (ref 150–400)
RBC: 3.69 MIL/uL — ABNORMAL LOW (ref 3.87–5.11)
RDW: 15.9 % — ABNORMAL HIGH (ref 11.5–15.5)
WBC: 12.9 10*3/uL — ABNORMAL HIGH (ref 4.0–10.5)
nRBC: 0 % (ref 0.0–0.2)

## 2020-04-03 ENCOUNTER — Ambulatory Visit
Admission: RE | Admit: 2020-04-03 | Discharge: 2020-04-03 | Disposition: A | Discharge status: Home / Self Care | Payer: Medicare Other | Source: Ambulatory Visit | Attending: Radiation Oncology | Admitting: Radiation Oncology

## 2020-04-03 ENCOUNTER — Emergency Department: Payer: Medicare Other

## 2020-04-03 ENCOUNTER — Ambulatory Visit: Payer: Medicare Other

## 2020-04-03 ENCOUNTER — Inpatient Hospital Stay: Payer: Medicare Other

## 2020-04-03 ENCOUNTER — Other Ambulatory Visit: Payer: Self-pay

## 2020-04-03 ENCOUNTER — Other Ambulatory Visit: Payer: Self-pay | Admitting: *Deleted

## 2020-04-03 ENCOUNTER — Emergency Department
Admission: EM | Admit: 2020-04-03 | Discharge: 2020-04-03 | Disposition: A | Discharge status: Home / Self Care | Payer: Medicare Other | Attending: Emergency Medicine | Admitting: Emergency Medicine

## 2020-04-03 DIAGNOSIS — E876 Hypokalemia: Secondary | ICD-10-CM | POA: Insufficient documentation

## 2020-04-03 DIAGNOSIS — Z7982 Long term (current) use of aspirin: Secondary | ICD-10-CM | POA: Insufficient documentation

## 2020-04-03 DIAGNOSIS — I2510 Atherosclerotic heart disease of native coronary artery without angina pectoris: Secondary | ICD-10-CM | POA: Diagnosis not present

## 2020-04-03 DIAGNOSIS — E039 Hypothyroidism, unspecified: Secondary | ICD-10-CM | POA: Insufficient documentation

## 2020-04-03 DIAGNOSIS — Z79899 Other long term (current) drug therapy: Secondary | ICD-10-CM | POA: Diagnosis not present

## 2020-04-03 DIAGNOSIS — F17210 Nicotine dependence, cigarettes, uncomplicated: Secondary | ICD-10-CM | POA: Diagnosis not present

## 2020-04-03 DIAGNOSIS — I10 Essential (primary) hypertension: Secondary | ICD-10-CM | POA: Diagnosis not present

## 2020-04-03 DIAGNOSIS — R609 Edema, unspecified: Secondary | ICD-10-CM | POA: Diagnosis present

## 2020-04-03 DIAGNOSIS — J449 Chronic obstructive pulmonary disease, unspecified: Secondary | ICD-10-CM | POA: Insufficient documentation

## 2020-04-03 DIAGNOSIS — R2243 Localized swelling, mass and lump, lower limb, bilateral: Secondary | ICD-10-CM | POA: Insufficient documentation

## 2020-04-03 DIAGNOSIS — R600 Localized edema: Secondary | ICD-10-CM | POA: Insufficient documentation

## 2020-04-03 LAB — CBC
HCT: 35.7 % — ABNORMAL LOW (ref 36.0–46.0)
Hemoglobin: 11.7 g/dL — ABNORMAL LOW (ref 12.0–15.0)
MCH: 31.4 pg (ref 26.0–34.0)
MCHC: 32.8 g/dL (ref 30.0–36.0)
MCV: 95.7 fL (ref 80.0–100.0)
Platelets: 197 10*3/uL (ref 150–400)
RBC: 3.73 MIL/uL — ABNORMAL LOW (ref 3.87–5.11)
RDW: 16.1 % — ABNORMAL HIGH (ref 11.5–15.5)
WBC: 11.5 10*3/uL — ABNORMAL HIGH (ref 4.0–10.5)
nRBC: 0 % (ref 0.0–0.2)

## 2020-04-03 LAB — HEPATIC FUNCTION PANEL
ALT: 31 U/L (ref 0–44)
AST: 25 U/L (ref 15–41)
Albumin: 3.4 g/dL — ABNORMAL LOW (ref 3.5–5.0)
Alkaline Phosphatase: 39 U/L (ref 38–126)
Bilirubin, Direct: <0.1 mg/dL (ref 0.0–0.2)
Total Bilirubin: 0.5 mg/dL (ref 0.3–1.2)
Total Protein: 6 g/dL — ABNORMAL LOW (ref 6.5–8.1)

## 2020-04-03 LAB — BASIC METABOLIC PANEL WITH GFR
Anion gap: 9 (ref 5–15)
BUN: 19 mg/dL (ref 8–23)
CO2: 30 mmol/L (ref 22–32)
Calcium: 7.2 mg/dL — ABNORMAL LOW (ref 8.9–10.3)
Chloride: 101 mmol/L (ref 98–111)
Creatinine, Ser: 0.93 mg/dL (ref 0.44–1.00)
GFR calc Af Amer: >60 mL/min
GFR calc non Af Amer: >60 mL/min
Glucose, Bld: 129 mg/dL — ABNORMAL HIGH (ref 70–99)
Potassium: 3 mmol/L — ABNORMAL LOW (ref 3.5–5.1)
Sodium: 140 mmol/L (ref 135–145)

## 2020-04-03 LAB — TROPONIN I (HIGH SENSITIVITY): Troponin I (High Sensitivity): 9 ng/L (ref <18)

## 2020-04-03 LAB — MAGNESIUM: Magnesium: 1.8 mg/dL (ref 1.7–2.4)

## 2020-04-03 LAB — BRAIN NATRIURETIC PEPTIDE: B Natriuretic Peptide: 137.3 pg/mL — ABNORMAL HIGH (ref 0.0–100.0)

## 2020-04-03 MED ORDER — POTASSIUM CHLORIDE CRYS ER 20 MEQ PO TBCR
40.0000 meq | EXTENDED_RELEASE_TABLET | Freq: Once | ORAL | Status: AC
  Administered 2020-04-03: 40 meq via ORAL
  Filled 2020-04-03: qty 2

## 2020-04-03 MED ORDER — FLUCONAZOLE 100 MG PO TABS
100.0000 mg | ORAL_TABLET | Freq: Every day | ORAL | 0 refills | Status: DC
Start: 2020-04-03 — End: 2020-04-08

## 2020-04-03 MED ORDER — FUROSEMIDE 20 MG PO TABS
20.0000 mg | ORAL_TABLET | Freq: Every day | ORAL | 0 refills | Status: DC
Start: 2020-04-03 — End: 2020-09-18

## 2020-04-03 MED ORDER — SODIUM CHLORIDE 0.9% FLUSH: 3.0000 mL | Freq: Once | INTRAVENOUS | Status: DC

## 2020-04-03 MED ORDER — CALCIUM GLUCONATE-NACL 2-0.675 GM/100ML-% IV SOLN
2.0000 g | Freq: Once | INTRAVENOUS | Status: AC
  Administered 2020-04-03: 2000 mg via INTRAVENOUS
  Filled 2020-04-03: qty 100

## 2020-04-03 NOTE — ED Triage Notes (Addendum)
Pt arrives POV from the cancer center for reports of bilateral leg swelling that started yesterday morning. Denies pain in legs. Denies swelling on any other body part. Pt in NAD, A&Ox4. Pt has mild pitting to the lower legs bilaterally. Denies SHOB or CP

## 2020-04-03 NOTE — ED Provider Notes (Signed)
[facility] Emergency Department Provider Note   ____________________________________________   I have reviewed the triage vital signs and the nursing notes.   HISTORY  Chief Complaint Leg Swelling   History limited by: Not Limited   HPI Maria Warren is a 64 y.o. female who presents to the emergency department today because of concerns for bilateral leg swelling.  Patient first noticed the symptoms yesterday.  She denies having leg swelling in the past.  Th the swelling is in both legs.  Patient denies any unusual activity or injury prior to the swelling started.  She only has some discomfort in the back of her calves with walking.  Her skin has been somewhat itchy although she denies any rashes.  She denies any chest pain or shortness of breath.  Denies any history of blood clots. States she is currently undergoing radiation at cancer center for a lesion on her voice box.    Records reviewed. Per medical record review patient has a history of COPD, depression.  Past Medical History:  Diagnosis Date  . Anxiety   . Arthritis    knees, lower back  . Cancer (HCC)    larygeal  . Carotid stenosis   . COPD (chronic obstructive pulmonary disease) ([place])   . Coronary artery disease   . Depression   . GERD (gastroesophageal reflux disease)   . Headache    Pain over left eye, then vision in left eye goes black, quickly resolves.  Said Dr. Melony Overly office aware.  . Hypertension   . Hypothyroidism   . Lower extremity edema   . Thyroid disease   . Wears dentures    full upper    Patient Active Problem List   Diagnosis Date Noted  . Goals of care, counseling/discussion 03/01/2020  . Carotid stenosis 01/25/2019  . Status post reverse arthroplasty of shoulder, left 04/26/2018  . Osteoarthritis of left knee 01/29/2018  . Adjustment disorder with mixed disturbance of emotions and conduct 08/09/2017  . Cocaine abuse ([place]) 08/09/2017  . Tobacco use disorder  02/17/2016  . Major depressive disorder, recurrent severe without psychotic features ([place]) 02/16/2016  . Cannabis use disorder, moderate, dependence ([place]) 02/16/2016  . Hypertension 02/16/2016  . Hypothyroidism 02/16/2016  . Coronary artery disease 02/16/2016  . Suicidal ideation 02/16/2016    Past Surgical History:  Procedure Laterality Date  . CARDIAC CATHETERIZATION    . CATARACT EXTRACTION W/PHACO Right 03/07/2017   Procedure: CATARACT EXTRACTION PHACO AND INTRAOCULAR LENS PLACEMENT ([place])  Right;  Surgeon: Eulogio Bear, MD;  Location: [place];  Service: Ophthalmology;  Laterality: Right;  . CATARACT EXTRACTION W/PHACO Left 04/11/2017   Procedure: CATARACT EXTRACTION PHACO AND INTRAOCULAR LENS PLACEMENT ([place])  left;  Surgeon: Eulogio Bear, MD;  Location: [place];  Service: Ophthalmology;  Laterality: Left;  . CESAREAN SECTION     x 2  . COLONOSCOPY N/A 11/24/2015   Procedure: COLONOSCOPY;  Surgeon: Lollie Sails, MD;  Location[facility] END[facility]COPY;  Service: Endoscopy;  Laterality: N/A;  . ESOPHAGOGASTRODUODENOSCOPY (EGD) WITH PROPOFOL N/A 11/24/2015   Procedure: ESOPHAGOGASTRODUODENOSCOPY (EGD) WITH PROPOFOL;  Surgeon: Lollie Sails, MD;  Location: [facility] ENDOSCOPY;  Service: Endoscopy;  Laterality: N/A;  . EYE SURGERY    . JOINT REPLACEMENT    . LEFT HEART CATH AND CORONARY ANGIOGRAPHY Left 01/11/2017   Procedure: Left Heart Cath and Coronary Angiography;  Surgeon: Corey Skains, MD;  Location: [place] CV LAB;  Service: Cardiovascular;  Laterality: Left;  .  MICROLARYNGOSCOPY Left 01/14/2020   Procedure: Suspension Microlaryngoscopy with biopsy;  Surgeon: Carloyn Manner, MD;  Location: [facility];  Service: ENT;  Laterality: Left;  . REVERSE SHOULDER ARTHROPLASTY Left 04/26/2018   Procedure: REVERSE SHOULDER ARTHROPLASTY;  Surgeon: Corky Mull, MD;  Location: [facility] OR[facility];  Service: Orthopedics;  Laterality: Left;  . TOTAL KNEE ARTHROPLASTY Left  01/29/2018   Procedure: TOTAL KNEE ARTHROPLASTY;  Surgeon: Lovell Sheehan, MD;  Location: [facility] ORS;  Service: Orthopedics;  Laterality: Left;  . TOTAL THYROIDECTOMY    . TUBAL LIGATION      Prior to Admission medications   Medication Sig Start Date End Date Taking? Authorizing Provider  acetaminophen (TYLENOL) 650 MG CR tablet Take 650-1,300 mg by mouth every 8 (eight) hours as needed for pain.    [provider]  ALPRAZolam Duanne Moron) 0.25 MG tablet Take 1 tablet (0.25 mg total) by mouth daily as needed for anxiety. 03/27/20   Noreene Filbert, MD  aspirin EC 81 MG tablet Take 81 mg by mouth daily.    [provider]  atorvastatin (LIPITOR) 80 MG tablet Take 80 mg by mouth every evening.     [provider]  B Complex-C (B-COMPLEX WITH VITAMIN C) tablet Take 1 tablet by mouth daily.    [provider]  Biotin 1000 MCG tablet Take 500 mcg by mouth daily.    [provider]  calcitRIOL (ROCALTROL) 0.5 MCG capsule Take 1 mcg by mouth in the morning and at bedtime.    [provider]  Cholecalciferol (VITAMIN D3) 50 MCG (2000 UT) TABS Take 2,000 Units by mouth daily.    [provider]  dexamethasone (DECADRON) 2 MG tablet Take 1 tablet (2 mg total) by mouth 2 (two) times daily. 03/03/20   Noreene Filbert, MD  diphenhydrAMINE (BENADRYL) 25 mg capsule Take 25 mg by mouth at bedtime as needed (for nose/watery eyes).     [provider]  ferrous sulfate 325 (65 FE) MG EC tablet Take 1 tablet (325 mg total) by mouth 2 (two) times daily with a meal. 02/25/20   Earlie Server, MD  fluconazole (DIFLUCAN) 100 MG tablet Take 1 tablet (100 mg total) by mouth daily. Take 2 tablets day 1, then 1 tablet daily until finished 04/03/20   Chrystal, Eulas Post, MD  isosorbide mononitrate (IMDUR) 30 MG 24 hr tablet Take 30 mg by mouth daily.    [provider]  levothyroxine (SYNTHROID, LEVOTHROID) 125 MCG tablet Take 125 mcg by mouth daily before breakfast.  03/07/18   [provider]  magic mouthwash SOLN Take 10 ml four times a day, swish and swollow 03/16/20   Johnn Hai, PA-C  nitroGLYCERIN (NITROSTAT) 0.4 MG SL tablet Place 0.4 mg under the tongue every 5 (five) minutes as needed for chest pain.    [provider]  ondansetron (ZOFRAN) 4 MG tablet Take 1 tablet (4 mg total) by mouth every 8 (eight) hours as needed for up to 10 doses for nausea or vomiting. 01/14/20   Vaught, Jeannie Fend, MD  pantoprazole (PROTONIX) 40 MG tablet Take 40 mg by mouth daily before breakfast.    [provider]  sucralfate (CARAFATE) 1 g tablet Take 1 tablet (1 g total) by mouth 3 (three) times daily. 02/25/20   Noreene Filbert, MD  tiotropium (SPIRIVA) 18 MCG inhalation capsule Place 18 mcg into inhaler and inhale daily.    [provider]  vitamin B-12 (CYANOCOBALAMIN) 500 MCG tablet Take 1,000 mcg by mouth daily.  [provider]  vitamin E 180 MG (400 UNITS) capsule Take 400 Units by mouth daily.    [provider]    Allergies Topiramate, Lyrica [pregabalin], Zoloft [sertraline hcl], and Bupropion  Family History  Problem Relation Age of Onset  . Breast cancer Maternal Aunt   . Heart failure Mother   . Arthritis Mother   . Heart attack Father     Social History Social History   Tobacco Use  . Smoking status: Current Every Day Smoker    Packs/day: 0.50    Years: 25.00    Pack years: 12.50    Types: Cigarettes  . Smokeless tobacco: Never Used  Substance Use Topics  . Alcohol use: Yes    Alcohol/week: 12.0 standard drinks    Types: 12 Cans of beer per week    Comment: 2 cans a night   . Drug use: Not Currently    Types: Cocaine, Marijuana    Comment: drug use in 1980s    Review of Systems Constitutional: No fever/chills Eyes: No visual changes. ENT: No sore throat. Cardiovascular: Denies chest pain. Respiratory: Denies shortness of breath. Gastrointestinal: No abdominal pain.  No  nausea, no vomiting.  No diarrhea.   Genitourinary: Negative for dysuria. Musculoskeletal: Positive for bilateral leg swelling. Skin: Negative for rash. Neurological: Negative for headaches, focal weakness or numbness.  ____________________________________________   PHYSICAL EXAM:  VITAL SIGNS: ED Triage Vitals  Enc Vitals Group     BP 04/03/20 1444 (!) 159/75     Pulse Rate 04/03/20 1444 64     Resp 04/03/20 1444 17     Temp 04/03/20 1444 99.5 F (37.5 C)     Temp Source 04/03/20 1444 Oral     SpO2 04/03/20 1444 95 %     Weight 04/03/20 1444 200 lb (90.7 kg)     Height 04/03/20 1444 5\' 3"  (1.6 m)     Head Circumference --      Peak Flow --      Pain Score 04/03/20 1447 0     Pain Loc --      Pain Edu? --      Excl. in [place]? --      Constitutional: Alert and oriented.  Eyes: Conjunctivae are normal.  ENT      Head: Normocephalic and atraumatic.      Nose: No congestion/rhinnorhea.      Mouth/Throat: Mucous membranes are moist.      Neck: No stridor. Hematological/Lymphatic/Immunilogical: No cervical lymphadenopathy. Cardiovascular: Normal rate, regular rhythm.  No murmurs, rubs, or gallops.  Respiratory: Normal respiratory effort without tachypnea nor retractions. Breath sounds are clear and equal bilaterally. No wheezes/rales/rhonchi. Gastrointestinal: Soft and non tender. No rebound. No guarding.  Genitourinary: Deferred Musculoskeletal: Bilateral lower extremity edema. No erythema. No tenderness.  Neurologic:  Normal speech and language. No gross focal neurologic deficits are appreciated.  Skin:  Skin is warm, dry and intact. No rash noted. Psychiatric: Mood and affect are normal. Speech and behavior are normal. Patient exhibits appropriate insight and judgment.  ____________________________________________    LABS (pertinent positives/negatives)  BNP 137.3 BMP na 140, k 3.0, glu 129, ca 7.2 Magnesium 1.8 Trop hs 9 Hepatic function panel wnl except t protein  6.0, alb 3.4 CBC wbc 11.5, hgb 11.7, plt 197  ____________________________________________   EKG  I, Nance Pear, attending physician, personally viewed and interpreted this EKG  EKG Time: 1452 Rate: 59 Rhythm: sinus bradycardia Axis: normal Intervals: qtc 457 QRS: incomplete RBBB ST  changes: no st elevation, t wave inversion II, III, aVF, v1-v6 Impression: abnormal ekg   ____________________________________________    RADIOLOGY  CXR Cardiomegaly, hyperinflation  US venous img lower bilateral No DVT  ____________________________________________   PROCEDURES  Procedures  ____________________________________________   INITIAL IMPRESSION / ASSESSMENT AND PLAN / ED COURSE  Pertinent labs & imaging results that were available during my care of the patient were reviewed by me and considered in my medical decision making (see chart for details).   Patient presented to the emergency department today with concerns for bilateral lower extremity swelling.  Broad work-up was initiated.  Patient without DVTs per ultrasound.  BNP was slightly elevated.  Per chart review the patient had an echocardiogram done roughly 1 year ago with normal left ventricular function.  Of note patient's calcium and potassium were both low.  I discussed this with the patient.  Did in fact discuss possible admission given hypocalcemia however patient stated she would like to get home since she had a dog.  Patient does have some baseline low calcium so I think this is not a completely unreasonable desire.  Patient was given IV calcium as well as potassium here.  Discussed with patient my desire that she have these values rechecked quickly.  Will also give the patient prescription for Lasix for the next couple of days.  ____________________________________________   FINAL CLINICAL IMPRESSION(S) / ED DIAGNOSES  Final diagnoses:  Hypokalemia  Hypocalcemia  Peripheral edema     Note: This  dictation was prepared with Dragon dictation. Any transcriptional errors that result from this process are unintentional     Nance Pear, MD 04/03/20 2110

## 2020-04-03 NOTE — Discharge Instructions (Signed)
As we discussed please call your physician Monday to have your potassium and calcium levels rechecked. Please seek medical attention for any high fevers, chest pain, shortness of breath, change in behavior, persistent vomiting, bloody stool or any other new or concerning symptoms.

## 2020-04-03 NOTE — ED Notes (Signed)
Reviewed discharge instructions and follow-up care with patient. Patient verbalized understanding of all information reviewed. Patient stable, with no distress noted at this time.    

## 2020-04-06 ENCOUNTER — Ambulatory Visit
Admission: RE | Admit: 2020-04-06 | Discharge: 2020-04-06 | Disposition: A | Discharge status: Home / Self Care | Payer: Medicare Other | Source: Ambulatory Visit | Attending: Radiation Oncology | Admitting: Radiation Oncology

## 2020-04-06 ENCOUNTER — Inpatient Hospital Stay: Payer: Medicare Other

## 2020-04-06 DIAGNOSIS — Z510 Encounter for antineoplastic radiation therapy: Secondary | ICD-10-CM | POA: Diagnosis not present

## 2020-04-07 ENCOUNTER — Ambulatory Visit
Admission: RE | Admit: 2020-04-07 | Discharge: 2020-04-07 | Disposition: A | Discharge status: Home / Self Care | Payer: Medicare Other | Source: Ambulatory Visit | Attending: Radiation Oncology | Admitting: Radiation Oncology

## 2020-04-07 ENCOUNTER — Inpatient Hospital Stay: Payer: Medicare Other

## 2020-04-07 DIAGNOSIS — Z510 Encounter for antineoplastic radiation therapy: Secondary | ICD-10-CM | POA: Diagnosis not present

## 2020-04-08 ENCOUNTER — Inpatient Hospital Stay: Payer: Medicare Other

## 2020-04-08 ENCOUNTER — Ambulatory Visit
Admission: RE | Admit: 2020-04-08 | Discharge: 2020-04-08 | Disposition: A | Discharge status: Home / Self Care | Payer: Medicare Other | Source: Ambulatory Visit | Attending: Radiation Oncology | Admitting: Radiation Oncology

## 2020-04-08 ENCOUNTER — Other Ambulatory Visit: Payer: Self-pay | Admitting: *Deleted

## 2020-04-08 DIAGNOSIS — Z510 Encounter for antineoplastic radiation therapy: Secondary | ICD-10-CM | POA: Diagnosis not present

## 2020-04-08 MED ORDER — FLUCONAZOLE 100 MG PO TABS
100.0000 mg | ORAL_TABLET | Freq: Every day | ORAL | 0 refills | Status: DC
Start: 2020-04-08 — End: 2020-05-14

## 2020-04-09 ENCOUNTER — Ambulatory Visit
Admission: RE | Admit: 2020-04-09 | Discharge: 2020-04-09 | Disposition: A | Discharge status: Home / Self Care | Payer: Medicare Other | Source: Ambulatory Visit | Attending: Radiation Oncology | Admitting: Radiation Oncology

## 2020-04-09 ENCOUNTER — Inpatient Hospital Stay: Payer: Medicare Other

## 2020-04-09 ENCOUNTER — Other Ambulatory Visit: Payer: Self-pay | Admitting: *Deleted

## 2020-04-09 DIAGNOSIS — C760 Malignant neoplasm of head, face and neck: Secondary | ICD-10-CM

## 2020-04-09 DIAGNOSIS — Z510 Encounter for antineoplastic radiation therapy: Secondary | ICD-10-CM | POA: Diagnosis not present

## 2020-04-09 MED ORDER — DEXAMETHASONE 2 MG PO TABS: 2.0000 mg | ORAL_TABLET | Freq: Every day | ORAL | 0 refills | Status: DC

## 2020-04-24 ENCOUNTER — Other Ambulatory Visit: Payer: Self-pay

## 2020-04-24 ENCOUNTER — Inpatient Hospital Stay: Payer: Medicare Other

## 2020-04-24 DIAGNOSIS — C760 Malignant neoplasm of head, face and neck: Secondary | ICD-10-CM | POA: Diagnosis not present

## 2020-04-24 LAB — CBC
HCT: 36.9 % (ref 36.0–46.0)
Hemoglobin: 12.1 g/dL (ref 12.0–15.0)
MCH: 32.4 pg (ref 26.0–34.0)
MCHC: 32.8 g/dL (ref 30.0–36.0)
MCV: 98.7 fL (ref 80.0–100.0)
Platelets: 173 10*3/uL (ref 150–400)
RBC: 3.74 MIL/uL — ABNORMAL LOW (ref 3.87–5.11)
RDW: 16.2 % — ABNORMAL HIGH (ref 11.5–15.5)
WBC: 9.2 10*3/uL (ref 4.0–10.5)
nRBC: 0 % (ref 0.0–0.2)

## 2020-04-27 ENCOUNTER — Encounter: Payer: Self-pay | Admitting: Oncology

## 2020-04-27 ENCOUNTER — Other Ambulatory Visit: Payer: Medicare Other

## 2020-04-27 ENCOUNTER — Telehealth: Payer: Self-pay | Admitting: *Deleted

## 2020-04-27 ENCOUNTER — Inpatient Hospital Stay (HOSPITAL_BASED_OUTPATIENT_CLINIC_OR_DEPARTMENT_OTHER): Payer: Medicare Other | Admitting: Oncology

## 2020-04-27 ENCOUNTER — Other Ambulatory Visit: Payer: Self-pay

## 2020-04-27 VITALS — BP 144/86 | HR 81 | Temp 97.2°F | Resp 18 | Wt 204.6 lb

## 2020-04-27 DIAGNOSIS — C760 Malignant neoplasm of head, face and neck: Secondary | ICD-10-CM | POA: Diagnosis not present

## 2020-04-27 DIAGNOSIS — Z7289 Other problems related to lifestyle: Secondary | ICD-10-CM | POA: Diagnosis not present

## 2020-04-27 DIAGNOSIS — K1230 Oral mucositis (ulcerative), unspecified: Secondary | ICD-10-CM

## 2020-04-27 DIAGNOSIS — Z720 Tobacco use: Secondary | ICD-10-CM | POA: Diagnosis not present

## 2020-04-27 DIAGNOSIS — Z789 Other specified health status: Secondary | ICD-10-CM | POA: Insufficient documentation

## 2020-04-27 DIAGNOSIS — F1090 Alcohol use, unspecified, uncomplicated: Secondary | ICD-10-CM

## 2020-04-27 MED ORDER — MAGIC MOUTHWASH W/LIDOCAINE
5.0000 mL | Freq: Four times a day (QID) | ORAL | 1 refills | Status: DC | PRN
Start: 2020-04-27 — End: 2020-11-13

## 2020-04-27 NOTE — Progress Notes (Signed)
Patient completed XRT on 04/09/20.  Reports 2 sores in mouth/throat and using salt water gargle as well as Carafate.  Would like a refill on magic mouthwash today.

## 2020-04-27 NOTE — Progress Notes (Addendum)
[place] note [facility] Telephone:([phone] [phone]   Patient Care Team: Elba Barman, MD as PCP - General (Family Medicine) Noreene Filbert, MD as Radiation Oncologist (Radiation Oncology)  REFERRING PROVIDER: Elba Barman, MD  CHIEF COMPLAINTS/REASON FOR VISIT:  Follow up of left supraglottic squamous cell carcinoma  HISTORY OF PRESENTING ILLNESS:   Maria Warren is a  64 y.o.  female with PMH listed below was seen in consultation at the request of  Elba Barman, MD  for evaluation of left supraglottic squamous cell carcinoma. Patient recently presented to ENT physician for increased head and neck pain Laryngoscope found a supraglottic lesion extending from the laryngeal surface of the epiglottis down to the left false vocal cord, no involvement of anterior commissure or left true vocal cord. Biopsy was taken and the results came back positive for invasive moderately differentiated squamous cell carcinoma with minimal keratinization Patient was referred to radiation oncology was seen by Dr. Baruch Gouty.  He ordered PET scan for further evaluation. Patient was also referred to medical oncology for further evaluation.  She denies any dysphagia symptoms.  Today patient was accompanied by her friend.  She denies any new complaints. She thought she was referred to see me for evaluation of anemia. Patient reports a longstanding history of anemia.  Also vitamin B12 deficiency she takes 1000 mg of vitamin B-12 daily. Denies any bloody or black stool.   INTERVAL HISTORY MYLANI Warren is a 64 y.o. 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  She tolerates the treatments with mild to moderate  difficulties including mucositis.  She uses Magic mouthwash.  She also reports feeling tired.  Today she reports that mucositis have improved, still has 2 mouth sores.  Review of Systems  Constitutional: Positive for fatigue. Negative for appetite change, chills and fever.  HENT:   Negative for hearing loss and voice change.        Mouth sore  Eyes: Negative for eye problems.  Respiratory: Negative for chest tightness and cough.   Cardiovascular: Negative for chest pain.  Gastrointestinal: Negative for abdominal distention, abdominal pain and blood in stool.  Endocrine: Negative for hot flashes.  Genitourinary: Negative for difficulty urinating and frequency.   Musculoskeletal: Negative for arthralgias.  Skin: Negative for itching and rash.  Neurological: Negative for extremity weakness.  Hematological: Negative for adenopathy.  Psychiatric/Behavioral: Negative for confusion.    MEDICAL HISTORY:  Past Medical History:  Diagnosis Date  . Anxiety   . Arthritis    knees, lower back  . Cancer (HCC)    [place]  . Carotid stenosis   . COPD (chronic obstructive pulmonary disease) ([place])   . Coronary artery disease   . Depression   . GERD (gastroesophageal reflux disease)   . Headache    Pain over left eye, then vision in left eye goes black, quickly resolves.  Said Dr. Melony Overly office aware.  . Hypertension   . Hypothyroidism   . Lower extremity edema   . Thyroid disease   . Wears dentures    full upper    SURGICAL HISTORY: Past Surgical History:  Procedure Laterality Date  . CARDIAC CATHETERIZATION    . CATARACT EXTRACTION W/PHACO Right 03/07/2017   Procedure: CATARACT EXTRACTION PHACO AND INTRAOCULAR  LENS PLACEMENT ([place])  Right;  Surgeon: Eulogio Bear, MD;  Location: [facility];  Service: Ophthalmology;  Laterality: Right;  . CATARACT EXTRACTION W/PHACO Left 04/11/2017   Procedure: CATARACT EXTRACTION PHACO AND INTRAOCULAR LENS PLACEMENT ([place])  left;  Surgeon:  Eulogio Bear, MD;  Location: [facility];  Service: Ophthalmology;  Laterality: Left;  . CESAREAN SECTION     x 2  . COLONOSCOPY N/A 11/24/2015   Procedure: COLONOSCOPY;  Surgeon: Lollie Sails, MD;  Location: [facility] ENDOSCOPY;  Service: Endoscopy;  Laterality: N/A;  . ESOPHAGOGASTRODUODENOSCOPY (EGD) WITH PROPOFOL N/A 11/24/2015   Procedure: ESOPHAGOGASTRODUODENOSCOPY (EGD) WITH PROPOFOL;  Surgeon: Lollie Sails, MD;  Location: [facility] ENDOSCOPY;  Service: Endoscopy;  Laterality: N/A;  . EYE SURGERY    . JOINT REPLACEMENT    . LEFT HEART CATH AND CORONARY ANGIOGRAPHY Left 01/11/2017   Procedure: Left Heart Cath and Coronary Angiography;  Surgeon: Corey Skains, MD;  Location: [place] CV LAB;  Service: Cardiovascular;  Laterality: Left;  Marland Kitchen MICROLARYNGOSCOPY Left 01/14/2020   Procedure: Suspension Microlaryngoscopy with biopsy;  Surgeon: Carloyn Manner, MD;  Location: [facility];  Service: ENT;  Laterality: Left;  . REVERSE SHOULDER ARTHROPLASTY Left 04/26/2018   Procedure: REVERSE SHOULDER ARTHROPLASTY;  Surgeon: Corky Mull, MD;  Location: [facility];  Service: Orthopedics;  Laterality: Left;  . TOTAL KNEE ARTHROPLASTY Left 01/29/2018   Procedure: TOTAL KNEE ARTHROPLASTY;  Surgeon: Lovell Sheehan, MD;  Location: [facility];  Service: Orthopedics;  Laterality: Left;  . TOTAL THYROIDECTOMY    . TUBAL LIGATION      SOCIAL HISTORY: Social History   Socioeconomic History  . Marital status: Widowed    Spouse name: Not on file  . Number of children: Not on file  . Years of education: Not on file  . Highest education level: Not on file  Occupational History  . Occupation: disabled   Tobacco Use  . Smoking status: Current Every Day Smoker    Packs/day: 0.50    Years: 25.00    Pack years: 12.50    Types: Cigarettes  . Smokeless tobacco: Never Used  Vaping Use  . Vaping Use: Former  Substance and Sexual Activity  . Alcohol use: Yes    Alcohol/week: 12.0 standard  drinks    Types: 12 Cans of beer per week    Comment: 2 cans a night   . Drug use: Not Currently    Types: Cocaine, Marijuana    Comment: drug use in 1980s  . Sexual activity: Never  Other Topics Concern  . Not on file  Social History Narrative  . Not on file   Social Determinants of Health   Financial Resource Strain:   . Difficulty of Paying Living Expenses:   Food Insecurity:   . Worried About Charity fundraiser in the Last Year:   . Arboriculturist in the Last Year:   Transportation Needs:   . Film/video editor (Medical):   Marland Kitchen Lack of Transportation (Non-Medical):   Physical Activity:   . Days of Exercise per Week:   . Minutes of Exercise per Session:   Stress:   . Feeling of Stress :   Social Connections:   . Frequency of Communication with Friends and Family:   . Frequency of Social Gatherings with Friends and Family:   . Attends Religious Services:   . Active Member of Clubs or Organizations:   . Attends Archivist Meetings:   .  Marital Status:   Intimate Partner Violence:   . Fear of Current or Ex-Partner:   . Emotionally Abused:   Marland Kitchen Physically Abused:   . Sexually Abused:     FAMILY HISTORY: Family History  Problem Relation Age of Onset  . Breast cancer Maternal Aunt   . Heart failure Mother   . Arthritis Mother   . Heart attack Father     ALLERGIES:  is allergic to topiramate, lyrica [pregabalin], zoloft [sertraline hcl], and bupropion.  MEDICATIONS:  Current Outpatient Medications  Medication Sig Dispense Refill  . acetaminophen (TYLENOL) 650 MG CR tablet Take 650-1,300 mg by mouth every 8 (eight) hours as needed for pain.    Marland Kitchen aspirin EC 81 MG tablet Take 81 mg by mouth daily.    Marland Kitchen atorvastatin (LIPITOR) 80 MG tablet Take 80 mg by mouth every evening.     . calcitRIOL (ROCALTROL) 0.5 MCG capsule Take 1 mcg by mouth in the morning and at bedtime.    . Cholecalciferol (VITAMIN D3) 50 MCG (2000 UT) TABS Take 2,000 Units by mouth  daily.    . ferrous sulfate 325 (65 FE) MG EC tablet Take 1 tablet (325 mg total) by mouth 2 (two) times daily with a meal. (Patient taking differently: Take 325 mg by mouth 2 (two) times daily with a meal. Patient takes 1 QD due to BID cause constipation) 60 tablet 1  . isosorbide mononitrate (IMDUR) 30 MG 24 hr tablet Take 30 mg by mouth daily.    Marland Kitchen levothyroxine (SYNTHROID, LEVOTHROID) 125 MCG tablet Take 125 mcg by mouth daily before breakfast.    . nitroGLYCERIN (NITROSTAT) 0.4 MG SL tablet Place 0.4 mg under the tongue every 5 (five) minutes as needed for chest pain.    . pantoprazole (PROTONIX) 40 MG tablet Take 40 mg by mouth daily before breakfast.    . sucralfate (CARAFATE) 1 g tablet Take 1 tablet (1 g total) by mouth 3 (three) times daily. 90 tablet 3  . tiotropium (SPIRIVA) 18 MCG inhalation capsule Place 18 mcg into inhaler and inhale daily.    . vitamin B-12 (CYANOCOBALAMIN) 500 MCG tablet Take 1,000 mcg by mouth daily.    Marland Kitchen ALPRAZolam (XANAX) 0.25 MG tablet Take 1 tablet (0.25 mg total) by mouth daily as needed for anxiety. (Patient not taking: Reported on 04/27/2020) 20 tablet 0  . B Complex-C (B-COMPLEX WITH VITAMIN C) tablet Take 1 tablet by mouth daily. (Patient not taking: Reported on 04/27/2020)    . Biotin 1000 MCG tablet Take 500 mcg by mouth daily. (Patient not taking: Reported on 04/27/2020)    . dexamethasone (DECADRON) 2 MG tablet Take 1 tablet (2 mg total) by mouth daily. Take 1 tablet daily x7 days, then 1 every other day until finished (Patient not taking: Reported on 04/27/2020) 10 tablet 0  . diphenhydrAMINE (BENADRYL) 25 mg capsule Take 25 mg by mouth at bedtime as needed (for nose/watery eyes).  (Patient not taking: Reported on 04/27/2020)    . fluconazole (DIFLUCAN) 100 MG tablet Take 1 tablet (100 mg total) by mouth daily. (Patient not taking: Reported on 04/27/2020) 11 tablet 0  . furosemide (LASIX) 20 MG tablet Take 1 tablet (20 mg total) by mouth daily for 3 days.  (Patient not taking: Reported on 04/27/2020) 3 tablet 0  . magic mouthwash SOLN Take 10 ml four times a day, swish and swollow (Patient not taking: Reported on 04/27/2020) 100 mL 0  . ondansetron (ZOFRAN) 4 MG tablet Take 1  tablet (4 mg total) by mouth every 8 (eight) hours as needed for up to 10 doses for nausea or vomiting. (Patient not taking: Reported on 04/27/2020) 20 tablet 0  . vitamin E 180 MG (400 UNITS) capsule Take 400 Units by mouth daily. (Patient not taking: Reported on 04/27/2020)     No current facility-administered medications for this visit.     PHYSICAL EXAMINATION: ECOG PERFORMANCE STATUS: 0 - Asymptomatic Vitals:   04/27/20 1050  BP: (!) 144/86  Pulse: 81  Resp: 18  Temp: (!) 97.2 F (36.2 C)   Filed Weights   04/27/20 1050  Weight: 204 lb 9.6 oz (92.8 kg)    Physical Exam Constitutional:      General: She is not in acute distress.    Appearance: She is obese.  HENT:     Head: Normocephalic and atraumatic.     Mouth/Throat:     Comments: Mild mucositis Eyes:     General: No scleral icterus. Cardiovascular:     Rate and Rhythm: Normal rate and regular rhythm.     Heart sounds: Normal heart sounds.  Pulmonary:     Effort: Pulmonary effort is normal. No respiratory distress.     Breath sounds: No wheezing.  Abdominal:     General: Bowel sounds are normal. There is no distension.     Palpations: Abdomen is soft.  Musculoskeletal:        General: No deformity. Normal range of motion.     Cervical back: Normal range of motion and neck supple.  Skin:    General: Skin is warm and dry.     Findings: No erythema or rash.  Neurological:     Mental Status: She is alert and oriented to person, place, and time. Mental status is at baseline.     Cranial Nerves: No cranial nerve deficit.     Coordination: Coordination normal.  Psychiatric:        Mood and Affect: Mood normal.     LABORATORY DATA:  I have reviewed the data as listed Lab Results  Component  Value Date   WBC 9.2 04/24/2020   HGB 12.1 04/24/2020   HCT 36.9 04/24/2020   MCV 98.7 04/24/2020   PLT 173 04/24/2020   Recent Labs    01/10/20 0936 02/05/20 1128 04/03/20 1451 04/03/20 1802  NA 139 143 140  --   K 3.6 3.8 3.0*  --   CL 103 103 101  --   CO2 28 29 30   --   GLUCOSE 132* 105* 129*  --   BUN 13 12 19   --   CREATININE 0.98 0.96 0.93  --   CALCIUM 8.3* 8.7* 7.2*  --   GFRNONAA >60 >60 >60  --   GFRAA >60 >60 >60  --   PROT  --  7.6  --  6.0*  ALBUMIN  --  4.1  --  3.4*  AST  --  23  --  25  ALT  --  19  --  31  ALKPHOS  --  60  --  39  BILITOT  --  0.6  --  0.5  BILIDIR  --   --   --  <0.1  IBILI  --   --   --  NOT CALCULATED   Iron/TIBC/Ferritin/ %Sat    Component Value Date/Time   IRON 58 02/05/2020 1128   TIBC 442 02/05/2020 1128   FERRITIN 8 (L) 02/05/2020 1128   IRONPCTSAT 13 02/05/2020 1128  RADIOGRAPHIC STUDIES: I have personally reviewed the radiological images as listed and agreed with the findings in the report. DG Chest 2 View  Result Date: 04/03/2020 CLINICAL DATA:  Leg swelling, shortness of breath EXAM: CHEST - 2 VIEW COMPARISON:  05/08/2015 FINDINGS: Cardiomegaly. No confluent opacities or overt edema. Mild hyperinflation. No effusions or acute bony abnormality. IMPRESSION: Cardiomegaly, hyperinflation.  No overt failure. Electronically Signed   By: Rolm Baptise M.D.   On: 04/03/2020 18:04   US Venous Img Lower Bilateral  Result Date: 04/03/2020 CLINICAL DATA:  Bilateral leg swelling. EXAM: BILATERAL LOWER EXTREMITY VENOUS DOPPLER ULTRASOUND TECHNIQUE: Gray-scale sonography with compression, as well as color and duplex ultrasound, were performed to evaluate the deep venous system(s) from the level of the common femoral vein through the popliteal and proximal calf veins. COMPARISON:  None. FINDINGS: VENOUS Normal compressibility of the common femoral, superficial femoral, and popliteal veins, as well as the visualized calf veins.  Visualized portions of profunda femoral vein and great saphenous vein unremarkable. No filling defects to suggest DVT on grayscale or color Doppler imaging. Doppler waveforms show normal direction of venous flow, normal respiratory plasticity and response to augmentation. OTHER None. Limitations: none IMPRESSION: No evidence of bilateral lower extremity DVT. Electronically Signed   By: Keith Rake M.D.   On: 04/03/2020 19:02      ASSESSMENT & PLAN:  1. Head and neck cancer ([place])   2. Mucositis   3. Tobacco abuse   4. Alcohol use    #Left supraglottic squamous cell carcinoma cT2 cN0 disease,  Status post definitive radiation. Patient has follow-up with radiation oncology in mid July. Assuming she has good response to RT, I recommend patient to repeat PET scan 12 weeks  weeks after radiation for evaluation of treatment response.  Plan was discussed with radiation oncology Dr. Baruch Gouty. She did not get chemotherapy. I will follow up with her in 1 year I will check TSH at that time. Recommend patient to also continue follow-up with ENT for examination.  Smoke cessation and alcohol cessation were both discussed with patient .  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  Continue Magic mouthwash 3-4 times daily as needed.  Orders Placed This Encounter  Procedures  . NM PET Image Restag (PS) Skull Base To Thigh    Standing Status:   Future    Standing Expiration Date:   04/27/2021    Order Specific Question:   ** REASON FOR EXAM (FREE TEXT)    Answer:   head and neck cancer s/p radiation.    Order Specific Question:   If indicated for the ordered procedure, I authorize the administration of a radiopharmaceutical per Radiology protocol    Answer:   Yes    Order Specific Question:   Preferred imaging location?    Answer:   [facility]    Order Specific Question:   Radiology Contrast Protocol - do NOT remove file path    Answer:   [email or web address]  . CBC with  Differential/Platelet    Standing Status:   Future    Standing Expiration Date:   04/27/2021  . Comprehensive metabolic panel    Standing Status:   Future    Standing Expiration Date:   04/27/2021    All questions were answered. The patient knows to call the clinic with any problems questions or concerns.  Return of visit: 1 year  Earlie Server, MD, PhD Hematology Oncology [facility] Pager- [phone] 04/27/2020

## 2020-04-27 NOTE — Telephone Encounter (Signed)
Called and made pt aware of her scheduled PET scan on 07/02/2020 Pt is aware of the scheduled location, date and time of her appt.

## 2020-05-14 ENCOUNTER — Ambulatory Visit
Admission: RE | Admit: 2020-05-14 | Discharge: 2020-05-14 | Disposition: A | Discharge status: Home / Self Care | Payer: Medicare Other | Source: Ambulatory Visit | Attending: Radiation Oncology | Admitting: Radiation Oncology

## 2020-05-14 ENCOUNTER — Other Ambulatory Visit: Payer: Self-pay

## 2020-05-14 VITALS — BP 151/92 | HR 84 | Temp 97.5°F | Wt 204.0 lb

## 2020-05-14 DIAGNOSIS — C760 Malignant neoplasm of head, face and neck: Secondary | ICD-10-CM

## 2020-05-14 NOTE — Progress Notes (Signed)
Radiation Oncology Follow up Note  Name: Maria Warren   Date:   05/14/2020 MRN:  [id number] DOB: [birth year]    This 64 y.o. female presents to the clinic today for 1 month follow-up status post head and neck.  Radiation therapy for squamous cell carcinoma of the left supraglottic larynx  REFERRING PROVIDER: Elba Barman, MD  HPI: Patient is a 64 year old female now at 1 month having completed external beam radiation therapy for squamous cell carcinoma of the supraglottic larynx.  Seen today in routine follow-up she is improving.  She states she still has alteration of taste although no specific head and neck pain or dysphagia..  COMPLICATIONS OF TREATMENT: none  FOLLOW UP COMPLIANCE: keeps appointments   PHYSICAL EXAM:  BP (!) 151/92   Pulse 84   Temp (!) 97.5 F (36.4 C) (Tympanic)   Wt 204 lb (92.5 kg)   BMI 36.14 kg/m  Oral cavity is clear no oral mucosal lesions are identified.  Neck is clear without evidence of cervical or supraclavicular adenopathy.  Well-developed well-nourished patient in NAD. HEENT reveals PERLA, EOMI, discs not visualized.  Oral cavity is clear. No oral mucosal lesions are identified. Neck is clear without evidence of cervical or supraclavicular adenopathy. Lungs are clear to A&P. Cardiac examination is essentially unremarkable with regular rate and rhythm without murmur rub or thrill. Abdomen is benign with no organomegaly or masses noted. Motor sensory and DTR levels are equal and symmetric in the upper and lower extremities. Cranial nerves II through XII are grossly intact. Proprioception is intact. No peripheral adenopathy or edema is identified. No motor or sensory levels are noted. Crude visual fields are within normal range.  RADIOLOGY RESULTS: No current films to review  PLAN: Present time of asked the patient make a follow-up appoint with Dr. Lula Olszewski and ENT.  I have asked to see her back in 3 to 4 months for follow-up.  Anticipate getting a PET CT  scan in the next several months which we will review at that time.  Patient knows to call with any concerns at any time.  I would like to take this opportunity to thank you for allowing me to participate in the care of your patient.Noreene Filbert, MD

## 2020-06-08 ENCOUNTER — Ambulatory Visit: Payer: Medicare Other

## 2020-06-16 ENCOUNTER — Other Ambulatory Visit: Payer: Self-pay

## 2020-06-16 ENCOUNTER — Ambulatory Visit (INDEPENDENT_AMBULATORY_CARE_PROVIDER_SITE_OTHER): Payer: Medicare Other

## 2020-06-16 ENCOUNTER — Encounter (INDEPENDENT_AMBULATORY_CARE_PROVIDER_SITE_OTHER): Payer: Self-pay | Admitting: Nurse Practitioner

## 2020-06-16 ENCOUNTER — Ambulatory Visit (INDEPENDENT_AMBULATORY_CARE_PROVIDER_SITE_OTHER): Payer: Medicare Other | Admitting: Nurse Practitioner

## 2020-06-16 VITALS — BP 147/86 | HR 82 | Resp 16 | Wt 197.4 lb

## 2020-06-16 DIAGNOSIS — F17200 Nicotine dependence, unspecified, uncomplicated: Secondary | ICD-10-CM | POA: Diagnosis not present

## 2020-06-16 DIAGNOSIS — F329 Major depressive disorder, single episode, unspecified: Secondary | ICD-10-CM | POA: Insufficient documentation

## 2020-06-16 DIAGNOSIS — F32A Depression, unspecified: Secondary | ICD-10-CM | POA: Insufficient documentation

## 2020-06-16 DIAGNOSIS — M1990 Unspecified osteoarthritis, unspecified site: Secondary | ICD-10-CM | POA: Insufficient documentation

## 2020-06-16 DIAGNOSIS — E782 Mixed hyperlipidemia: Secondary | ICD-10-CM

## 2020-06-16 DIAGNOSIS — I10 Essential (primary) hypertension: Secondary | ICD-10-CM | POA: Diagnosis not present

## 2020-06-16 DIAGNOSIS — I6523 Occlusion and stenosis of bilateral carotid arteries: Secondary | ICD-10-CM

## 2020-06-16 DIAGNOSIS — M810 Age-related osteoporosis without current pathological fracture: Secondary | ICD-10-CM | POA: Insufficient documentation

## 2020-06-16 DIAGNOSIS — M519 Unspecified thoracic, thoracolumbar and lumbosacral intervertebral disc disorder: Secondary | ICD-10-CM | POA: Insufficient documentation

## 2020-06-16 MED ORDER — CLOPIDOGREL BISULFATE 75 MG PO TABS: 75.0000 mg | ORAL_TABLET | Freq: Every day | ORAL | 5 refills | Status: DC

## 2020-06-16 NOTE — Progress Notes (Signed)
Subjective:    Patient ID: Maria Warren, female    DOB: [birth year], 64 y.o.   MRN: [id number] Chief Complaint  Patient presents with  . Follow-up    ultrasound follow up    The patient is seen for follow up evaluation of carotid stenosis. The carotid stenosis followed by ultrasound.   The patient's last office since the patient's last office visit she has completed treatment for a nodule on her vocal cords.  The patient denies amaurosis fugax. There is no recent history of TIA symptoms or focal motor deficits. There is no prior documented CVA.  The patient is taking enteric-coated aspirin 81 mg daily.  There is no history of migraine headaches. There is no history of seizures.  The patient has a history of coronary artery disease, no recent episodes of angina or shortness of breath. The patient denies PAD or claudication symptoms. There is a history of hyperlipidemia which is being treated with a statin.    Duplex ultrasound shows 60-79% of the RICA and an occluded LICA, this is an increase in velocities from the previous study on 12/03/2019   Review of Systems  Eyes: Negative for visual disturbance.  Neurological: Negative for dizziness, weakness and headaches.  All other systems reviewed and are negative.      Objective:   Physical Exam Vitals reviewed.  HENT:     Head: Normocephalic.  Neck:     Vascular: No carotid bruit.  Cardiovascular:     Rate and Rhythm: Normal rate and regular rhythm.     Pulses: Normal pulses.  Pulmonary:     Effort: Pulmonary effort is normal.  Skin:    General: Skin is warm and dry.  Neurological:     Mental Status: She is alert and oriented to person, place, and time.  Psychiatric:        Mood and Affect: Mood normal.        Behavior: Behavior normal.        Thought Content: Thought content normal.        Judgment: Judgment normal.     BP (!) 147/86 (BP Location: Right Arm)   Pulse 82   Resp 16   Wt 197 lb 6.4 oz (89.5 kg)    BMI 34.97 kg/m   Past Medical History:  Diagnosis Date  . Anxiety   . Arthritis    knees, lower back  . Cancer (HCC)    [place]  . Carotid stenosis   . COPD (chronic obstructive pulmonary disease) ([place])   . Coronary artery disease   . Depression   . GERD (gastroesophageal reflux disease)   . Headache    Pain over left eye, then vision in left eye goes black, quickly resolves.  Said Dr. Melony Overly office aware.  . Hypertension   . Hypothyroidism   . Lower extremity edema   . Thyroid disease   . Wears dentures    full upper    Social History   Socioeconomic History  . Marital status: Widowed    Spouse name: Not on file  . Number of children: Not on file  . Years of education: Not on file  . Highest education level: Not on file  Occupational History  . Occupation: disabled   Tobacco Use  . Smoking status: Current Every Day Smoker    Packs/day: 0.50    Years: 25.00    Pack years: 12.50    Types: Cigarettes  . Smokeless tobacco: Never Used  Vaping Use  .  Vaping Use: Former  Substance and Sexual Activity  . Alcohol use: Yes    Alcohol/week: 12.0 standard drinks    Types: 12 Cans of beer per week    Comment: 2 cans a night   . Drug use: Not Currently    Types: Cocaine, Marijuana    Comment: drug use in 1980s  . Sexual activity: Never  Other Topics Concern  . Not on file  Social History Narrative  . Not on file   Social Determinants of Health   Financial Resource Strain:   . Difficulty of Paying Living Expenses:   Food Insecurity:   . Worried About Charity fundraiser in the Last Year:   . Arboriculturist in the Last Year:   Transportation Needs:   . Film/video editor (Medical):   Marland Kitchen Lack of Transportation (Non-Medical):   Physical Activity:   . Days of Exercise per Week:   . Minutes of Exercise per Session:   Stress:   . Feeling of Stress :   Social Connections:   . Frequency of Communication with Friends and Family:   . Frequency of Social  Gatherings with Friends and Family:   . Attends Religious Services:   . Active Member of Clubs or Organizations:   . Attends Archivist Meetings:   Marland Kitchen Marital Status:   Intimate Partner Violence:   . Fear of Current or Ex-Partner:   . Emotionally Abused:   Marland Kitchen Physically Abused:   . Sexually Abused:     Past Surgical History:  Procedure Laterality Date  . CARDIAC CATHETERIZATION    . CATARACT EXTRACTION W/PHACO Right 03/07/2017   Procedure: CATARACT EXTRACTION PHACO AND INTRAOCULAR LENS PLACEMENT ([place])  Right;  Surgeon: Eulogio Bear, MD;  Location: [facility];  Service: Ophthalmology;  Laterality: Right;  . CATARACT EXTRACTION W/PHACO Left 04/11/2017   Procedure: CATARACT EXTRACTION PHACO AND INTRAOCULAR LENS PLACEMENT ([place])  left;  Surgeon: Eulogio Bear, MD;  Location: [facility];  Service: Ophthalmology;  Laterality: Left;  . CESAREAN SECTION     x 2  . COLONOSCOPY N/A 11/24/2015   Procedure: COLONOSCOPY;  Surgeon: Lollie Sails, MD;  Location: [facility] ENDOSCOPY;  Service: Endoscopy;  Laterality: N/A;  . ESOPHAGOGASTRODUODENOSCOPY (EGD) WITH PROPOFOL N/A 11/24/2015   Procedure: ESOPHAGOGASTRODUODENOSCOPY (EGD) WITH PROPOFOL;  Surgeon: Lollie Sails, MD;  Location: [facility] ENDOSCOPY;  Service: Endoscopy;  Laterality: N/A;  . EYE SURGERY    . JOINT REPLACEMENT    . LEFT HEART CATH AND CORONARY ANGIOGRAPHY Left 01/11/2017   Procedure: Left Heart Cath and Coronary Angiography;  Surgeon: Corey Skains, MD;  Location: [facility];  Service: Cardiovascular;  Laterality: Left;  Marland Kitchen MICROLARYNGOSCOPY Left 01/14/2020   Procedure: Suspension Microlaryngoscopy with biopsy;  Surgeon: Carloyn Manner, MD;  Location: [facility];  Service: ENT;  Laterality: Left;  . REVERSE SHOULDER ARTHROPLASTY Left 04/26/2018   Procedure: REVERSE SHOULDER ARTHROPLASTY;  Surgeon: Corky Mull, MD;  Location: [facility];  Service: Orthopedics;  Laterality: Left;  . TOTAL  KNEE ARTHROPLASTY Left 01/29/2018   Procedure: TOTAL KNEE ARTHROPLASTY;  Surgeon: Lovell Sheehan, MD;  Location: [facility];  Service: Orthopedics;  Laterality: Left;  . TOTAL THYROIDECTOMY    . TUBAL LIGATION      Family History  Problem Relation Age of Onset  . Breast cancer Maternal Aunt   . Heart failure Mother   . Arthritis Mother   . Heart attack Father  Allergies  Allergen Reactions  . Topiramate Nausea Only and Anxiety  . Lyrica [Pregabalin] Nausea Only  . Zoloft [Sertraline Hcl] Nausea Only  . Bupropion Nausea Only    Light headed Light headed       Assessment & Plan:   1. Bilateral carotid artery stenosis Recommend:  Given the patient's asymptomatic subcritical stenosis no further invasive testing or surgery at this time.  Duplex ultrasound shows 60-79% of the RICA and an occluded LICA  Continue antiplatelet therapy as prescribed.  Plavix was added to the patient's medication regimen due to increase in velocities and occluded left ICA. Continue management of CAD, HTN and Hyperlipidemia Healthy heart diet,  encouraged exercise at least 4 times per week Follow up in 6 months with duplex ultrasound and physical exam  - clopidogrel (PLAVIX) 75 MG tablet; Take 1 tablet (75 mg total) by mouth daily.  Dispense: 30 tablet; Refill: 5 - VAS US CAROTID; Future  2. Tobacco use disorder Smoking cessation was discussed, 3-10 minutes spent on this topic specifically   3. Essential hypertension Continue antihypertensive medications as already ordered, these medications have been reviewed and there are no changes at this time.   4. Mixed hyperlipidemia Continue statin as ordered and reviewed, no changes at this time    Current Outpatient Medications on File Prior to Visit  Medication Sig Dispense Refill  . acetaminophen (TYLENOL) 650 MG CR tablet Take 650-1,300 mg by mouth every 8 (eight) hours as needed for pain.    Marland Kitchen aspirin EC 81 MG tablet Take 81 mg by mouth  daily.    . B Complex-C (B-COMPLEX WITH VITAMIN C) tablet Take 1 tablet by mouth daily.     . Biotin 1000 MCG tablet Take 500 mcg by mouth daily.     . calcitRIOL (ROCALTROL) 0.5 MCG capsule Take 1 mcg by mouth in the morning and at bedtime.    . Cholecalciferol (VITAMIN D3) 50 MCG (2000 UT) TABS Take 2,000 Units by mouth daily.    . clonazePAM (KLONOPIN) 0.5 MG tablet Take 0.5 mg by mouth daily as needed.    . diphenhydrAMINE (BENADRYL) 25 mg capsule Take 25 mg by mouth at bedtime as needed (for nose/watery eyes).     . ferrous sulfate 325 (65 FE) MG EC tablet Take 1 tablet (325 mg total) by mouth 2 (two) times daily with a meal. (Patient taking differently: Take 325 mg by mouth 2 (two) times daily with a meal. Patient takes 1 QD due to BID cause constipation) 60 tablet 1  . isosorbide mononitrate (IMDUR) 30 MG 24 hr tablet Take 30 mg by mouth daily.    Marland Kitchen levothyroxine (SYNTHROID, LEVOTHROID) 125 MCG tablet Take 137 mcg by mouth daily before breakfast.     . nitroGLYCERIN (NITROSTAT) 0.4 MG SL tablet Place 0.4 mg under the tongue every 5 (five) minutes as needed for chest pain.    . pantoprazole (PROTONIX) 40 MG tablet Take 40 mg by mouth daily before breakfast.    . rosuvastatin (CRESTOR) 40 MG tablet Take 40 mg by mouth daily.    Marland Kitchen tiotropium (SPIRIVA) 18 MCG inhalation capsule Place 18 mcg into inhaler and inhale daily.    . vitamin B-12 (CYANOCOBALAMIN) 500 MCG tablet Take 1,000 mcg by mouth daily.    Marland Kitchen atorvastatin (LIPITOR) 80 MG tablet Take 80 mg by mouth every evening.  (Patient not taking: Reported on 06/16/2020)    . furosemide (LASIX) 20 MG tablet Take 1 tablet (20 mg total) by  mouth daily for 3 days. (Patient not taking: Reported on 04/27/2020) 3 tablet 0  . magic mouthwash w/lidocaine SOLN Take 5 mLs by mouth 4 (four) times daily as needed for mouth pain. Sig: Swish/Swallow 5-10 ml four times a day as needed. Dispense 480 ml. 1RF (Patient not taking: Reported on 06/16/2020) 480 mL 1  .  sucralfate (CARAFATE) 1 g tablet Take 1 tablet (1 g total) by mouth 3 (three) times daily. (Patient not taking: Reported on 06/16/2020) 90 tablet 3   No current facility-administered medications on file prior to visit.    There are no Patient Instructions on file for this visit. No follow-ups on file.   Kris Hartmann, NP

## 2020-07-02 ENCOUNTER — Other Ambulatory Visit: Payer: Self-pay

## 2020-07-02 ENCOUNTER — Ambulatory Visit
Admission: RE | Admit: 2020-07-02 | Discharge: 2020-07-02 | Disposition: A | Discharge status: Home / Self Care | Payer: Medicare Other | Source: Ambulatory Visit | Attending: Oncology | Admitting: Oncology

## 2020-07-02 DIAGNOSIS — I700 Atherosclerosis of aorta: Secondary | ICD-10-CM | POA: Insufficient documentation

## 2020-07-02 DIAGNOSIS — C760 Malignant neoplasm of head, face and neck: Secondary | ICD-10-CM | POA: Insufficient documentation

## 2020-07-02 LAB — GLUCOSE, CAPILLARY: Glucose-Capillary: 87 mg/dL (ref 70–99)

## 2020-07-02 MED ORDER — FLUDEOXYGLUCOSE F - 18 (FDG) INJECTION
10.2000 | Freq: Once | INTRAVENOUS | Status: AC | PRN
  Administered 2020-07-02: 11.2 via INTRAVENOUS

## 2020-07-07 ENCOUNTER — Telehealth: Payer: Self-pay

## 2020-07-07 NOTE — Telephone Encounter (Signed)
-----   Message from Earlie Server, MD sent at 07/04/2020 12:05 PM EDT ----- Please let her know that PET scan showed good response to radiation.  Follow up as planned.  Persistent annal activity on PET scan, she has had colonoscopy in 2017. Ask her to make a follow up appt with Chauncey Mann Cory Munch, PA at [facility] for direct visualization of that area.   Cc Dr.Chrystal.

## 2020-07-07 NOTE — Telephone Encounter (Signed)
Detailed message left on patient's phone.

## 2020-07-31 DIAGNOSIS — U071 COVID-19: Secondary | ICD-10-CM

## 2020-07-31 HISTORY — DX: COVID-19: U07.1

## 2020-09-07 ENCOUNTER — Encounter: Payer: Self-pay | Admitting: Emergency Medicine

## 2020-09-07 ENCOUNTER — Telehealth (INDEPENDENT_AMBULATORY_CARE_PROVIDER_SITE_OTHER): Payer: Self-pay

## 2020-09-07 ENCOUNTER — Emergency Department
Admission: EM | Admit: 2020-09-07 | Discharge: 2020-09-07 | Disposition: A | Discharge status: Home / Self Care | Payer: Medicare Other | Attending: Emergency Medicine | Admitting: Emergency Medicine

## 2020-09-07 ENCOUNTER — Other Ambulatory Visit: Payer: Self-pay

## 2020-09-07 DIAGNOSIS — R040 Epistaxis: Secondary | ICD-10-CM | POA: Diagnosis present

## 2020-09-07 DIAGNOSIS — Z96612 Presence of left artificial shoulder joint: Secondary | ICD-10-CM | POA: Diagnosis not present

## 2020-09-07 DIAGNOSIS — F17210 Nicotine dependence, cigarettes, uncomplicated: Secondary | ICD-10-CM | POA: Diagnosis not present

## 2020-09-07 DIAGNOSIS — E039 Hypothyroidism, unspecified: Secondary | ICD-10-CM | POA: Diagnosis not present

## 2020-09-07 DIAGNOSIS — I10 Essential (primary) hypertension: Secondary | ICD-10-CM | POA: Diagnosis not present

## 2020-09-07 DIAGNOSIS — Z96652 Presence of left artificial knee joint: Secondary | ICD-10-CM | POA: Diagnosis not present

## 2020-09-07 DIAGNOSIS — Z79899 Other long term (current) drug therapy: Secondary | ICD-10-CM | POA: Insufficient documentation

## 2020-09-07 DIAGNOSIS — I2510 Atherosclerotic heart disease of native coronary artery without angina pectoris: Secondary | ICD-10-CM | POA: Diagnosis not present

## 2020-09-07 DIAGNOSIS — Z7901 Long term (current) use of anticoagulants: Secondary | ICD-10-CM | POA: Insufficient documentation

## 2020-09-07 DIAGNOSIS — J449 Chronic obstructive pulmonary disease, unspecified: Secondary | ICD-10-CM | POA: Insufficient documentation

## 2020-09-07 DIAGNOSIS — Z7982 Long term (current) use of aspirin: Secondary | ICD-10-CM | POA: Diagnosis not present

## 2020-09-07 LAB — CBC WITH DIFFERENTIAL/PLATELET
Abs Immature Granulocytes: 0.03 10*3/uL (ref 0.00–0.07)
Basophils Absolute: 0 10*3/uL (ref 0.0–0.1)
Basophils Relative: 1 %
Eosinophils Absolute: 0.1 10*3/uL (ref 0.0–0.5)
Eosinophils Relative: 1 %
HCT: 36.5 % (ref 36.0–46.0)
Hemoglobin: 12.4 g/dL (ref 12.0–15.0)
Immature Granulocytes: 0 %
Lymphocytes Relative: 11 %
Lymphs Abs: 1 10*3/uL (ref 0.7–4.0)
MCH: 33.2 pg (ref 26.0–34.0)
MCHC: 34 g/dL (ref 30.0–36.0)
MCV: 97.9 fL (ref 80.0–100.0)
Monocytes Absolute: 0.9 10*3/uL (ref 0.1–1.0)
Monocytes Relative: 10 %
Neutro Abs: 6.8 10*3/uL (ref 1.7–7.7)
Neutrophils Relative %: 77 %
Platelets: 237 10*3/uL (ref 150–400)
RBC: 3.73 MIL/uL — ABNORMAL LOW (ref 3.87–5.11)
RDW: 12.2 % (ref 11.5–15.5)
WBC: 8.8 10*3/uL (ref 4.0–10.5)
nRBC: 0 % (ref 0.0–0.2)

## 2020-09-07 LAB — BASIC METABOLIC PANEL
Anion gap: 8 (ref 5–15)
BUN: 10 mg/dL (ref 8–23)
CO2: 29 mmol/L (ref 22–32)
Calcium: 7.9 mg/dL — ABNORMAL LOW (ref 8.9–10.3)
Chloride: 106 mmol/L (ref 98–111)
Creatinine, Ser: 0.69 mg/dL (ref 0.44–1.00)
GFR, Estimated: >60 mL/min (ref >60)
Glucose, Bld: 132 mg/dL — ABNORMAL HIGH (ref 70–99)
Potassium: 3.8 mmol/L (ref 3.5–5.1)
Sodium: 143 mmol/L (ref 135–145)

## 2020-09-07 LAB — PROTIME-INR
INR: 0.9 (ref 0.8–1.2)
Prothrombin Time: 11.9 seconds (ref 11.4–15.2)

## 2020-09-07 LAB — APTT: aPTT: 28 seconds (ref 24–36)

## 2020-09-07 MED ORDER — OXYMETAZOLINE HCL 0.05 % NA SOLN
1.0000 | Freq: Once | NASAL | Status: AC
  Administered 2020-09-07: 1 via NASAL
  Filled 2020-09-07: qty 30

## 2020-09-07 MED ORDER — METOPROLOL TARTRATE 25 MG PO TABS
25.0000 mg | ORAL_TABLET | Freq: Once | ORAL | Status: AC
  Administered 2020-09-07: 25 mg via ORAL
  Filled 2020-09-07: qty 1

## 2020-09-07 MED ORDER — METOPROLOL TARTRATE 25 MG PO TABS: 25.0000 mg | ORAL_TABLET | Freq: Two times a day (BID) | ORAL | 0 refills | Status: DC

## 2020-09-07 MED ORDER — CLONIDINE HCL 0.1 MG PO TABS: 0.1000 mg | ORAL_TABLET | Freq: Once | ORAL | Status: DC

## 2020-09-07 MED ORDER — TRANEXAMIC ACID FOR EPISTAXIS
500.0000 mg | Freq: Once | TOPICAL | Status: AC
  Administered 2020-09-07: 500 mg via TOPICAL
  Filled 2020-09-07: qty 10

## 2020-09-07 NOTE — ED Provider Notes (Signed)
Procedures  Clinical Course as of Sep 07 920  [facility] Sep 07, 2020  0722 Transferring ED care to Dr. Joni Fears to follow up and see if epistaxis has resolved after TXA neb.   [CF]  0725 BMP and CBC are reassuring and WNL   [CF]    Clinical Course User Index [CF] Hinda Kehr, MD    ----------------------------------------- 9:21 AM on 09/07/2020 -----------------------------------------  Patient feeling better, nosebleed resolved without recurrent bleeding since medication administration.  Vitals have remained essentially normal except for mildly elevated blood pressure.  I will start the patient on 2 weeks worth of oral metoprolol to lower blood pressure and heart rate to lower the chance of rebleeding while her nose heals.  Recommend follow-up with PCP in a week to have blood pressure rechecked.  Patient was reexamined.  There is a raw area of the nasal septal mucosa at the right anterior naris where it was previously bleeding, currently hemostatic.  Left naris is normal, no blood in the oropharynx.  Lungs clear to auscultation with unlabored breathing.   Carrie Mew, MD 09/07/20 [phone]

## 2020-09-07 NOTE — Discharge Instructions (Addendum)

## 2020-09-07 NOTE — Telephone Encounter (Signed)
She can hold for 3 days, and that should be enough to get it to stop

## 2020-09-07 NOTE — Telephone Encounter (Signed)
Patient was made aware with medical advice and verbalized understanding 

## 2020-09-07 NOTE — ED Provider Notes (Signed)
[facility] Emergency Department Provider Note  ____________________________________________   First MD Initiated Contact with Patient 09/07/20 [phone]     (approximate)  I have reviewed the triage vital signs and the nursing notes.   HISTORY  Chief Complaint Epistaxis    HPI Maria Warren is a 64 y.o. female with medical history as listed below which notably includes treatment for laryngeal cancer with radiation therapy.  She also takes aspirin and Plavix.  She presents tonight for acute onset and severe right-sided nosebleed.  She has had no recent trauma.  She says she was sleeping well and then woke up and felt something dripping down her throat and discovered it was blood.  She tried to put pressure on her nose and hold her head back but it did not stop bleeding for a couple of hours so she came to the emergency department.  She had the bleeding has improved while waiting with a nasal clip in place.  She has passed a couple of large clots from her mouth.  She is in no respiratory distress.  She denies fever, sore throat, chest pain, shortness of breath, nausea, vomiting, and abdominal pain.  She has no history of low platelet count and is not on chemotherapy.  She has not had any lightheadedness, dizziness, nor syncope.   Nothing particular made the bleeding worse, direct pressure with a nasal clip seem to make it better.        Past Medical History:  Diagnosis Date  . Anxiety   . Arthritis    knees, lower back  . Cancer (HCC)    [place]  . Carotid stenosis   . COPD (chronic obstructive pulmonary disease) ([place])   . Coronary artery disease   . Depression   . GERD (gastroesophageal reflux disease)   . Headache    Pain over left eye, then vision in left eye goes black, quickly resolves.  Said Dr. Melony Overly office aware.  . Hypertension   . Hypothyroidism   . Lower extremity edema   . Thyroid disease   . Wears dentures    full upper    Patient  Active Problem List   Diagnosis Date Noted  . Depression 06/16/2020  . Lumbosacral disc disease 06/16/2020  . Osteoarthritis 06/16/2020  . Osteoporosis 06/16/2020  . Tobacco abuse 04/27/2020  . Head and neck cancer ([place]) 04/27/2020  . Mucositis 04/27/2020  . Alcohol use 04/27/2020  . Goals of care, counseling/discussion 03/01/2020  . Carotid stenosis 01/25/2019  . Abnormal ECG 11/13/2018  . Bilateral carotid artery stenosis 11/13/2018  . Status post reverse arthroplasty of shoulder, left 04/26/2018  . Osteoarthritis of left knee 01/29/2018  . Adjustment disorder with mixed disturbance of emotions and conduct 08/09/2017  . Cocaine abuse ([place]) 08/09/2017  . Tobacco use disorder 02/17/2016  . Major depressive disorder, recurrent severe without psychotic features ([place]) 02/16/2016  . Cannabis use disorder, moderate, dependence ([place]) 02/16/2016  . Hypertension 02/16/2016  . Hypothyroidism 02/16/2016  . Coronary artery disease 02/16/2016  . Suicidal ideation 02/16/2016  . Benign essential hypertension 08/24/2015  . COPD (chronic obstructive pulmonary disease) ([place]) 05/06/2014  . Mixed hyperlipidemia 05/06/2014  . Chronic low back pain 04/19/2013  . Lumbar spondylosis 04/19/2013    Past Surgical History:  Procedure Laterality Date  . CARDIAC CATHETERIZATION    . CATARACT EXTRACTION W/PHACO Right 03/07/2017   Procedure: CATARACT EXTRACTION PHACO AND INTRAOCULAR LENS PLACEMENT ([place])  Right;  Surgeon: Eulogio Bear, MD;  Location: [place]  [facility];  Service: Ophthalmology;  Laterality: Right;  . CATARACT EXTRACTION W/PHACO Left 04/11/2017   Procedure: CATARACT EXTRACTION PHACO AND INTRAOCULAR LENS PLACEMENT ([place])  left;  Surgeon: Eulogio Bear, MD;  Location: [place];  Service: Ophthalmology;  Laterality: Left;  . CESAREAN SECTION     x 2  . COLONOSCOPY N/A 11/24/2015   Procedure: COLONOSCOPY;  Surgeon: Lollie Sails, MD;  Location: [facility] ENDOSCOPY;  Service:  Endoscopy;  Laterality: N/A;  . ESOPHAGOGASTRODUODENOSCOPY (EGD) WITH PROPOFOL N/A 11/24/2015   Procedure: ESOPHAGOGASTRODUODENOSCOPY (EGD) WITH PROPOFOL;  Surgeon: Lollie Sails, MD;  Location: [facility] ENDOSCOPY;  Service: Endoscopy;  Laterality: N/A;  . EYE SURGERY    . JOINT REPLACEMENT    . LEFT HEART CATH AND CORONARY ANGIOGRAPHY Left 01/11/2017   Procedure: Left Heart Cath and Coronary Angiography;  Surgeon: Corey Skains, MD;  Location: [place] CV LAB;  Service: Cardiovascular;  Laterality: Left;  Marland Kitchen MICROLARYNGOSCOPY Left 01/14/2020   Procedure: Suspension Microlaryngoscopy with biopsy;  Surgeon: Carloyn Manner, MD;  Location: [facility]S;  Service: ENT;  Laterality: Left;  . REVERSE SHOULDER ARTHROPLASTY Left 04/26/2018   Procedure: REVERSE SHOULDER ARTHROPLASTY;  Surgeon: Corky Mull, MD;  Location: [facility] ORS;  Service: Orthopedics;  Laterality: Left;  . TOTAL KNEE ARTHROPLASTY Left 01/29/2018   Procedure: TOTAL KNEE ARTHROPLASTY;  Surgeon: Lovell Sheehan, MD;  Location: [facility]S;  Service: Orthopedics;  Laterality: Left;  . TOTAL THYROIDECTOMY    . TUBAL LIGATION      Prior to Admission medications   Medication Sig Start Date End Date Taking? Authorizing Provider  acetaminophen (TYLENOL) 650 MG CR tablet Take 650-1,300 mg by mouth every 8 (eight) hours as needed for pain.    [provider]  aspirin EC 81 MG tablet Take 81 mg by mouth daily.    [provider]  atorvastatin (LIPITOR) 80 MG tablet Take 80 mg by mouth every evening.  Patient not taking: Reported on 06/16/2020    [provider]  B Complex-C (B-COMPLEX WITH VITAMIN C) tablet Take 1 tablet by mouth daily.     [provider]  Biotin 1000 MCG tablet Take 500 mcg by mouth daily.     [provider]  calcitRIOL (ROCALTROL) 0.5 MCG capsule Take 1 mcg by mouth in the morning and at bedtime.    [provider]  Cholecalciferol (VITAMIN D3) 50 MCG (2000 UT) TABS Take  2,000 Units by mouth daily.    [provider]  clonazePAM (KLONOPIN) 0.5 MG tablet Take 0.5 mg by mouth daily as needed. 04/22/20   [provider]  clopidogrel (PLAVIX) 75 MG tablet Take 1 tablet (75 mg total) by mouth daily. 06/16/20   Kris Hartmann, NP  diphenhydrAMINE (BENADRYL) 25 mg capsule Take 25 mg by mouth at bedtime as needed (for nose/watery eyes).     [provider]  ferrous sulfate 325 (65 FE) MG EC tablet Take 1 tablet (325 mg total) by mouth 2 (two) times daily with a meal. Patient taking differently: Take 325 mg by mouth 2 (two) times daily with a meal. Patient takes 1 QD due to BID cause constipation 02/25/20   Earlie Server, MD  furosemide (LASIX) 20 MG tablet Take 1 tablet (20 mg total) by mouth daily for 3 days. Patient not taking: Reported on 04/27/2020 04/03/20 04/06/20  Nance Pear, MD  isosorbide mononitrate (IMDUR) 30 MG 24 hr tablet Take 30 mg by mouth daily.    [provider]  levothyroxine (SYNTHROID, LEVOTHROID) 125 MCG tablet Take 137 mcg by mouth daily before breakfast.  03/07/18   [provider]  magic mouthwash w/lidocaine SOLN Take 5 mLs by mouth 4 (four) times daily as needed for mouth pain. Sig: Swish/Swallow 5-10 ml four times a day as needed. Dispense 480 ml. 1RF Patient not taking: Reported on 06/16/2020 04/27/20   Earlie Server, MD  nitroGLYCERIN (NITROSTAT) 0.4 MG SL tablet Place 0.4 mg under the tongue every 5 (five) minutes as needed for chest pain.    [provider]  pantoprazole (PROTONIX) 40 MG tablet Take 40 mg by mouth daily before breakfast.    [provider]  rosuvastatin (CRESTOR) 40 MG tablet Take 40 mg by mouth daily. 06/10/20   [provider]  sucralfate (CARAFATE) 1 g tablet Take 1 tablet (1 g total) by mouth 3 (three) times daily. Patient not taking: Reported on 06/16/2020 02/25/20   Noreene Filbert, MD  tiotropium (SPIRIVA) 18 MCG inhalation capsule Place 18 mcg into inhaler and  inhale daily.    [provider]  vitamin B-12 (CYANOCOBALAMIN) 500 MCG tablet Take 1,000 mcg by mouth daily.    [provider]    Allergies Topiramate, Lyrica [pregabalin], Zoloft [sertraline hcl], and Bupropion  Family History  Problem Relation Age of Onset  . Breast cancer Maternal Aunt   . Heart failure Mother   . Arthritis Mother   . Heart attack Father     Social History Social History   Tobacco Use  . Smoking status: Current Every Day Smoker    Packs/day: 0.50    Years: 25.00    Pack years: 12.50    Types: Cigarettes  . Smokeless tobacco: Never Used  Vaping Use  . Vaping Use: Former  Substance Use Topics  . Alcohol use: Yes    Alcohol/week: 14.0 standard drinks    Types: 14 Cans of beer per week    Comment: 2 cans a night   . Drug use: Not Currently    Types: Cocaine, Marijuana    Comment: drug use in 1980s    Review of Systems Constitutional: No fever/chills Eyes: No visual changes. ENT: Acute nosebleed. Cardiovascular: Denies chest pain. Respiratory: Denies shortness of breath. Gastrointestinal: No abdominal pain.  No nausea, no vomiting.  No diarrhea.  No constipation. Genitourinary: Negative for dysuria. Musculoskeletal: Negative for neck pain.  Negative for back pain. Integumentary: Negative for rash. Neurological: Negative for headaches, focal weakness or numbness.   ____________________________________________   PHYSICAL EXAM:  VITAL SIGNS: ED Triage Vitals [09/07/20 0354]  Enc Vitals Group     BP (!) 159/86     Pulse Rate 96     Resp 18     Temp 98.3 F (36.8 C)     Temp Source Oral     SpO2 97 %     Weight 86.2 kg (190 lb)     Height 1.6 m (5\' 3" )     Head Circumference      Peak Flow      Pain Score 0     Pain Loc      Pain Edu?      Excl. in [place]?     Constitutional: Alert and oriented.  Eyes: Conjunctivae are normal.  Head: Atraumatic. Nose: Acute epistaxis that seems to be coming from the right naris  although there was some overflow bleeding from the left naris as well.  No gross abnormality visualized but visualization is limited due to blood.  Mouth/Throat: Patient is wearing a mask. Neck: No stridor.  No meningeal signs.   Cardiovascular: Normal rate, regular rhythm. Good peripheral circulation. Grossly normal heart sounds. Respiratory: Normal respiratory effort.  No retractions. Gastrointestinal: Soft and nontender. No distention.  Musculoskeletal: No lower extremity tenderness nor edema. No gross deformities of extremities. Neurologic:  Normal speech and language. No gross focal neurologic deficits are appreciated.  Skin:  Skin is warm, dry and intact. Psychiatric: Mood and affect are normal. Speech and behavior are normal.  ____________________________________________   LABS (all labs ordered are listed, but only abnormal results are displayed)  Labs Reviewed  CBC WITH DIFFERENTIAL/PLATELET - Abnormal; Notable for the following components:      Result Value   RBC 3.73 (*)    All other components within normal limits  BASIC METABOLIC PANEL - Abnormal; Notable for the following components:   Glucose, Bld 132 (*)    Calcium 7.9 (*)    All other components within normal limits  PROTIME-INR  APTT   ____________________________________________  EKG  No indication for emergent EKG ____________________________________________  RADIOLOGY I, Hinda Kehr, personally viewed and evaluated these images (plain radiographs) as part of my medical decision making, as well as reviewing the written report by the radiologist.  ED MD interpretation: No indication for emergent imaging  Official radiology report(s): No results found.  ____________________________________________   PROCEDURES   Procedure(s) performed (including Critical Care):  Procedures   ____________________________________________   INITIAL IMPRESSION / MDM / [place] / ED COURSE  As part of  my medical decision making, I reviewed the following data within the [place] notes reviewed and incorporated, Labs reviewed , Old chart reviewed, Patient signed out to Dr. Joni Fears and Notes from prior ED visits   Differential diagnosis includes, but is not limited to, nonspecific nosebleed, mechanical irritation, posterior/arterial bleed, neoplasm or cancer involvement, thrombocytopenia, other nonspecific bleeding diathesis.  Given the acute onset of bleeding in a status post cancer patient on dual antiplatelet therapy, I will check baseline labs including a CBC with differential.  However the bleeding seems to have already stopped.  While I was in the room she spit out a large clot and is not having any additional bleeding.  However to try to make sure it does not start again I sprayed both of her nares with oxymetazoline zone and reapplied a nasal clip.  I will check in about 20 minutes to see if she is having any additional bleeding.  If she is still having some bleeding I will try some TXA before moving to Merocel.  She understands and agrees with the plan.       Clinical Course as of Sep 07 724  Mon Sep 07, 2020  0722 Transferring ED care to Dr. Joni Fears to follow up and see if epistaxis has resolved after TXA neb.   [CF]  0725 BMP and CBC are reassuring and WNL   [CF]    Clinical Course User Index [CF] Hinda Kehr, MD     ____________________________________________  FINAL CLINICAL IMPRESSION(S) / ED DIAGNOSES  Final diagnoses:  Epistaxis     MEDICATIONS GIVEN DURING THIS VISIT:  Medications  tranexamic acid (CYKLOKAPRON) 1000 MG/10ML topical solution 500 mg (500 mg Topical Given 09/07/20 0645)  oxymetazoline (AFRIN) 0.05 % nasal spray 1 spray (1 spray Each Nare Given 09/07/20 0640)     ED Discharge Orders    None      *Please note:  Maria Warren was  evaluated in Emergency Department on 09/07/2020 for the symptoms described in the  history of present illness. She was evaluated in the context of the global COVID-19 pandemic, which necessitated consideration that the patient might be at risk for infection with the SARS-CoV-2 virus that causes COVID-19. Institutional protocols and algorithms that pertain to the evaluation of patients at risk for COVID-19 are in a state of rapid change based on information released by regulatory bodies including the CDC and federal and state organizations. These policies and algorithms were followed during the patient's care in the ED.  Some ED evaluations and interventions may be delayed as a result of limited staffing during and after the pandemic.*  Note:  This document was prepared using Dragon voice recognition software and may include unintentional dictation errors.   Hinda Kehr, MD 09/07/20 [phone]

## 2020-09-07 NOTE — ED Notes (Signed)
Pt in stretcher. Pt using tissues to spit into.

## 2020-09-07 NOTE — ED Triage Notes (Signed)
Patient brought in by ems from home with a nose bleed that started about 02:50 this am. Patient takes asa and Plavix daily. Nose clamp applied by ems.

## 2020-09-08 ENCOUNTER — Ambulatory Visit (INDEPENDENT_AMBULATORY_CARE_PROVIDER_SITE_OTHER): Payer: Medicare Other | Admitting: Vascular Surgery

## 2020-09-08 ENCOUNTER — Encounter (INDEPENDENT_AMBULATORY_CARE_PROVIDER_SITE_OTHER): Payer: Medicare Other

## 2020-09-18 ENCOUNTER — Encounter: Payer: Self-pay | Admitting: Radiation Oncology

## 2020-09-19 IMAGING — US US EXTREM LOW VENOUS
1 series · 14 of 24 positions shown · non-contrast
Comparison: None.

CLINICAL DATA: Bilateral leg swelling.

EXAM:
BILATERAL LOWER EXTREMITY VENOUS DOPPLER ULTRASOUND
TECHNIQUE: Gray-scale sonography with compression, as well as color and duplex
ultrasound, were performed to evaluate the deep venous system(s)
from the level of the common femoral vein through the popliteal and
proximal calf veins.

[Series 1: us venous img lower bilat (dvt) · portal-venous · 14 of 60 slices shown]
[im 1/60]
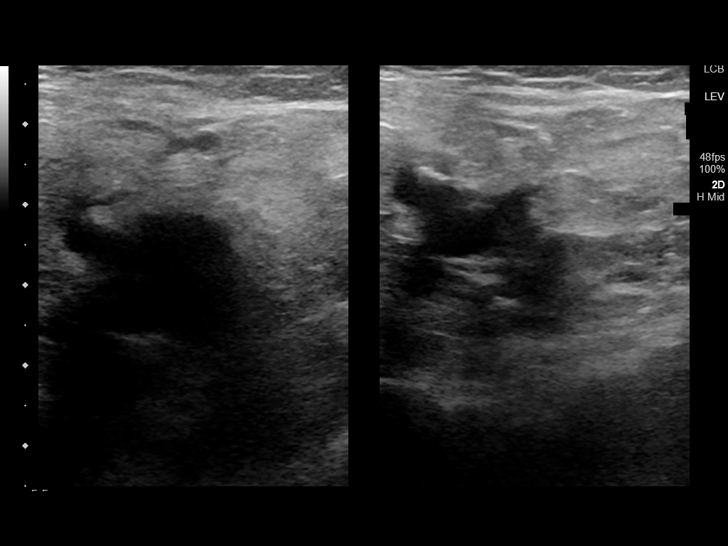
[im 6/60]
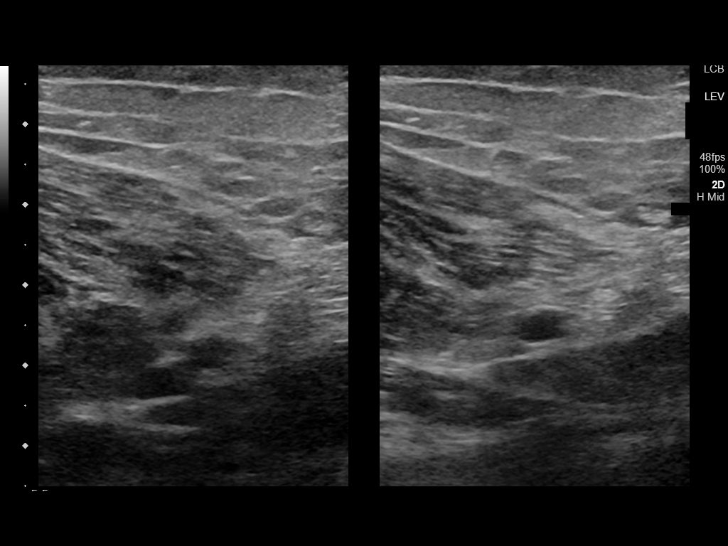
[im 11/60]
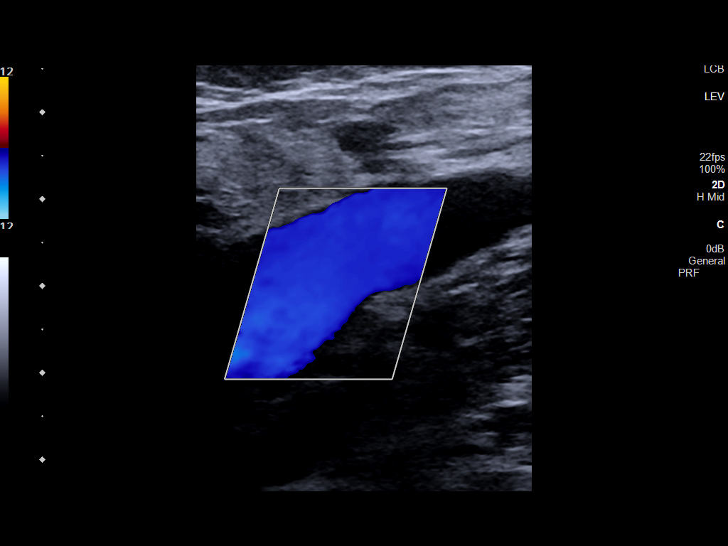
[im 16/60]
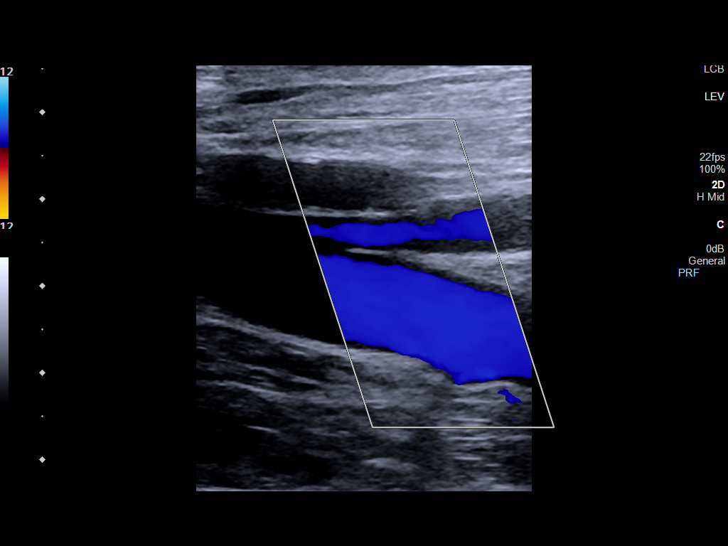
[im 18/60]
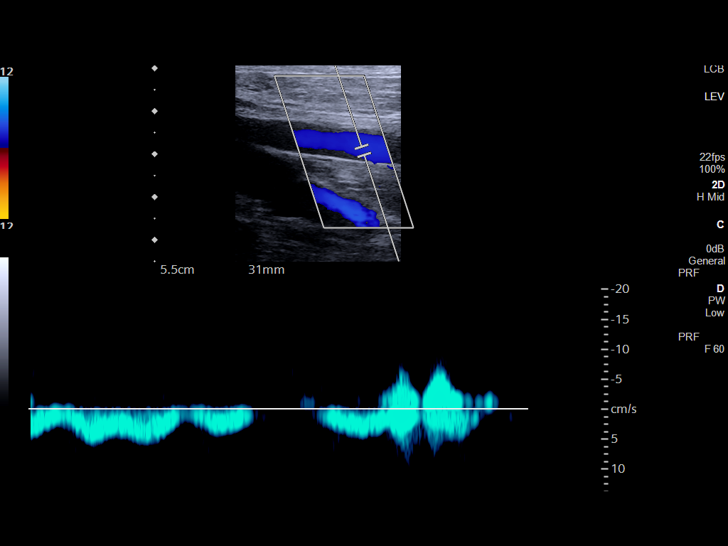
[im 24/60]
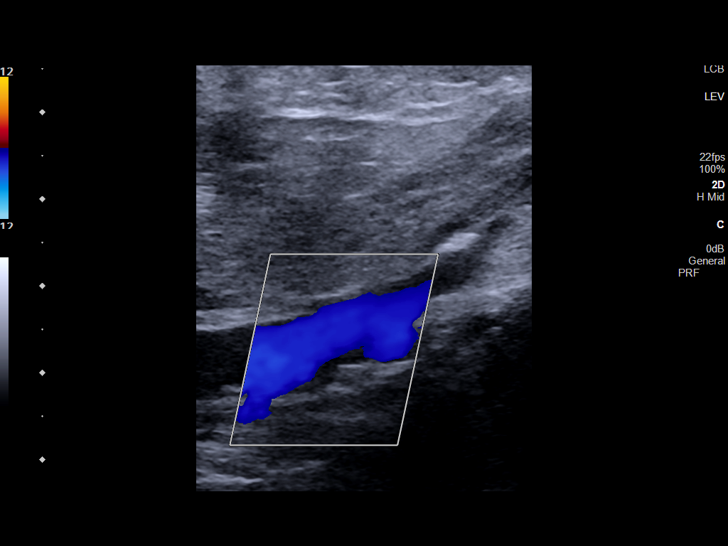
[im 29/60]
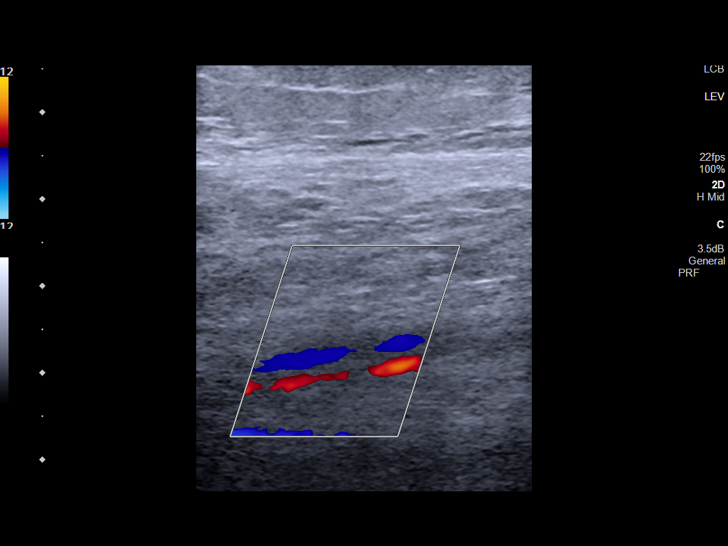
[im 31/60]
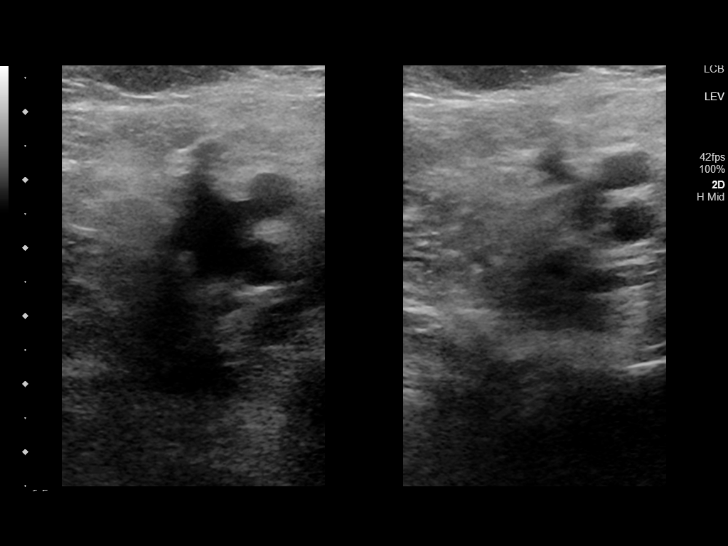
[im 36/60]
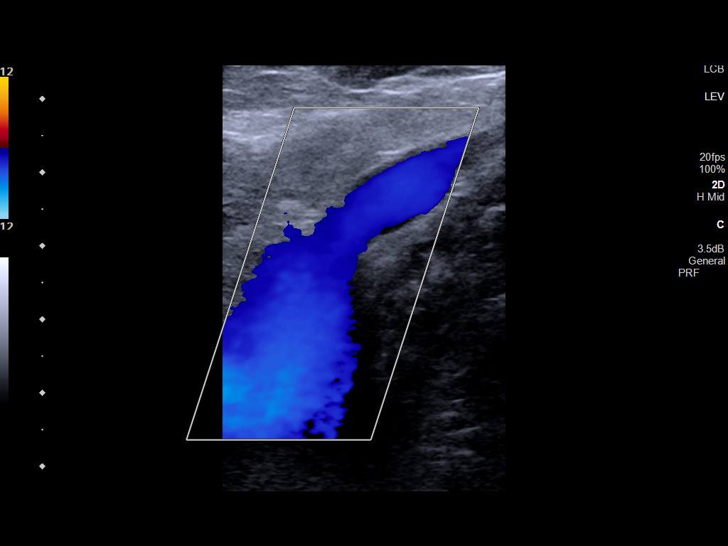
[im 42/60]
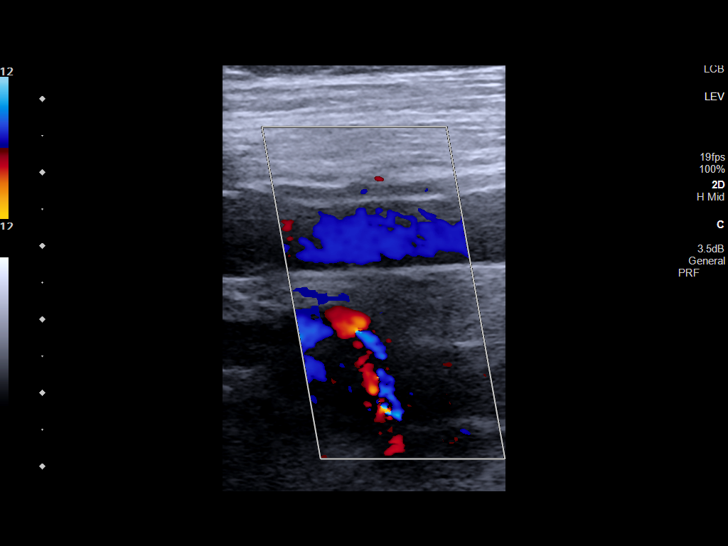
[im 47/60]
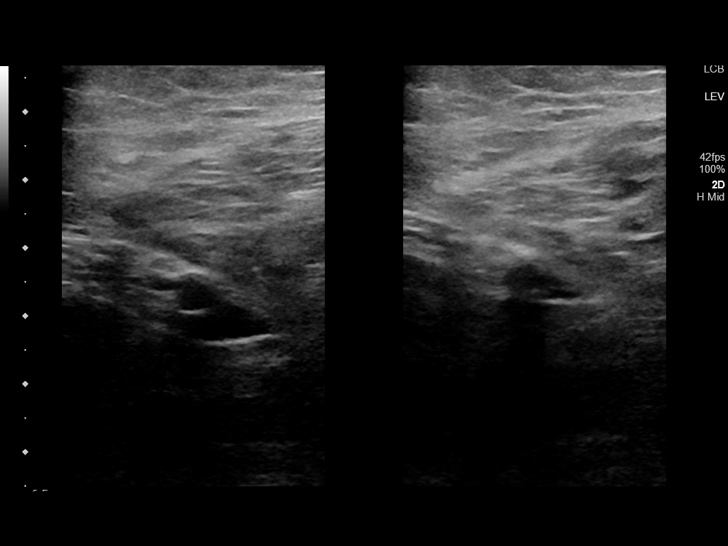
[im 49/60]
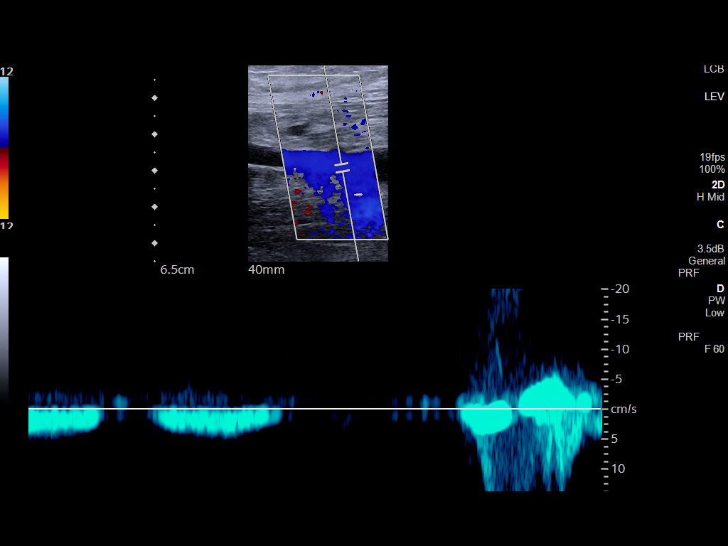
[im 54/60]
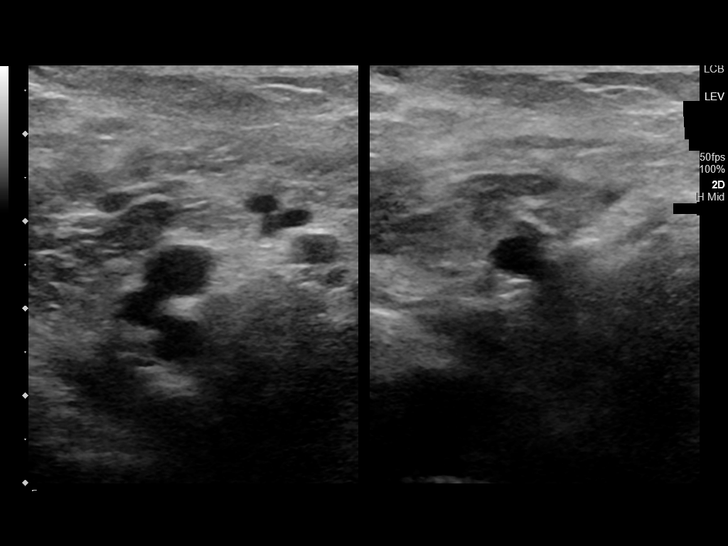
[im 60/60]
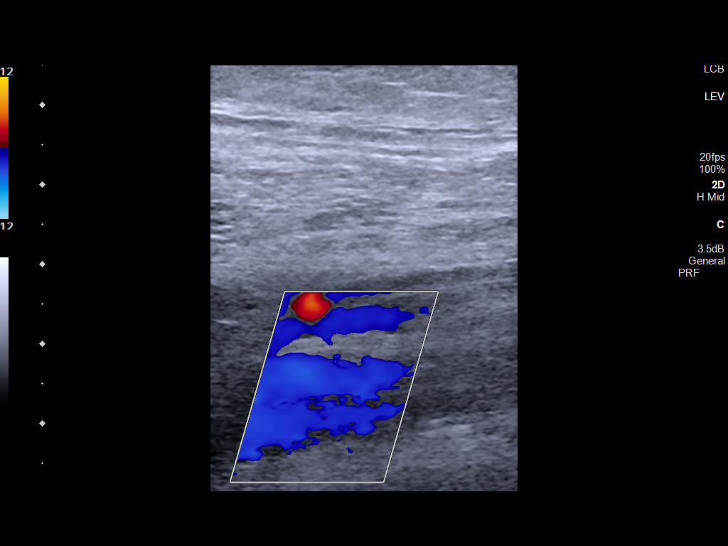

[14 of 24 positions shown; findings below may reference images not displayed]

FINDINGS: VENOUS

Normal compressibility of the common femoral, superficial femoral,
and popliteal veins, as well as the visualized calf veins.
Visualized portions of profunda femoral vein and great saphenous
vein unremarkable. No filling defects to suggest DVT on grayscale or
color Doppler imaging. Doppler waveforms show normal direction of
venous flow, normal respiratory plasticity and response to
augmentation.

OTHER

None.

Limitations: none
IMPRESSION: No evidence of bilateral lower extremity DVT.

## 2020-09-21 ENCOUNTER — Ambulatory Visit
Admission: RE | Admit: 2020-09-21 | Discharge: 2020-09-21 | Disposition: A | Discharge status: Home / Self Care | Payer: Medicare Other | Source: Ambulatory Visit | Attending: Radiation Oncology | Admitting: Radiation Oncology

## 2020-09-21 ENCOUNTER — Inpatient Hospital Stay: Payer: Medicare Other | Admitting: Oncology

## 2020-09-21 ENCOUNTER — Other Ambulatory Visit: Payer: Self-pay

## 2020-09-21 DIAGNOSIS — C329 Malignant neoplasm of larynx, unspecified: Secondary | ICD-10-CM | POA: Diagnosis present

## 2020-09-21 DIAGNOSIS — Z923 Personal history of irradiation: Secondary | ICD-10-CM | POA: Insufficient documentation

## 2020-09-21 DIAGNOSIS — C760 Malignant neoplasm of head, face and neck: Secondary | ICD-10-CM

## 2020-09-21 NOTE — Progress Notes (Addendum)
Survivorship Care Plan visit completed.  Treatment summary reviewed and given to patient.  ASCO answers booklet reviewed and given to patient.  CARE program and Cancer Transitions discussed with patient along with other resources cancer center offers to patients and caregivers.      Patient in agreement for APP to call her tomorrow at 2:00 to introduce her to [facility].

## 2020-09-21 NOTE — Progress Notes (Signed)
Radiation Oncology Follow up Note  Name: Maria Warren   Date:   09/21/2020 MRN:  [id number] DOB: 1956-06-13    This 64 y.o. 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  Seen today in routine follow-up she is doing well.  She specifically denies head and neck pain dysphagia or fatigue.  Her taste has returned.  She had a upper endoscopy last week with ENT showing no evidence of disease..  She had a PET scan back in September which I have reviewed showed resolution of supraglottic mass since prior study.  COMPLICATIONS OF TREATMENT: none  FOLLOW UP COMPLIANCE: keeps appointments   PHYSICAL EXAM:  BP (!) (P) 157/86 (BP Location: Left Arm, Patient Position: Sitting)   Pulse (P) 72   Temp (P) 97.9 F (36.6 C) (Tympanic)   Resp (P) 16   Wt (P) 191 lb 1.6 oz (86.7 kg)   BMI (P) 33.85 kg/m  No evidence of cervical or supraclavicular adenopathy.  Well-developed well-nourished patient in NAD. HEENT reveals PERLA, EOMI, discs not visualized.  Oral cavity is clear. No oral mucosal lesions are identified. Neck is clear without evidence of cervical or supraclavicular adenopathy. Lungs are clear to A&P. Cardiac examination is essentially unremarkable with regular rate and rhythm without murmur rub or thrill. Abdomen is benign with no organomegaly or masses noted. Motor sensory and DTR levels are equal and symmetric in the upper and lower extremities. Cranial nerves II through XII are grossly intact. Proprioception is intact. No peripheral adenopathy or edema is identified. No motor or sensory levels are noted. Crude visual fields are within normal range.  RADIOLOGY RESULTS: PET CT scan reviewed compatible with  above-stated findings  PLAN: Present time patient is now 5 months out from radiation therapy for squamous cell carcinoma the supraglottic larynx.  And pleased with her overall progress.  I have asked to see her back in 6 months for follow-up.  We will then start once year follow-up visits.  She continues close follow-up care by ENT.  Patient knows to call with any concerns.  I would like to take this opportunity to thank you for allowing me to participate in the care of your patient.Noreene Filbert, MD

## 2020-09-22 ENCOUNTER — Inpatient Hospital Stay: Payer: Medicare Other | Attending: Oncology | Admitting: Nurse Practitioner

## 2020-09-22 DIAGNOSIS — Z8589 Personal history of malignant neoplasm of other organs and systems: Secondary | ICD-10-CM

## 2020-09-22 NOTE — Progress Notes (Signed)
[place] note [facility]  Telephone:([phone] [phone]  Virtual Visit Progress Note  I connected with [name] on 09/22/20 at  2:15 PM EST by telephone visit and verified that I am speaking with the correct person using two identifiers.   I discussed the limitations, risks, security and privacy concerns of performing an evaluation and management service by telemedicine and the availability of in-person appointments. I also discussed with the patient that there may be a patient responsible charge related to this service. The patient expressed understanding and agreed to proceed.   Other persons participating in the visit and their role in the encounter: none  Patient's location: home Provider's location: clinic  Patient Care Team: [facility] [place] as PCP - Josem Kaufmann, MD as Consulting Physician (Oncology) Noreene Filbert, MD as Radiation Oncologist (Radiation Oncology) Carloyn Manner, MD as Referring Physician (Otolaryngology)   Name of the patient: Maria Warren  [id number]  10/16/1956   Date of visit: 09/22/20  CLINIC:  Survivorship  REASON FOR VISIT:  Survivorship Care Plan visit & follow-up post-treatment for a recent history of head & neck cancer.    BRIEF ONCOLOGIC HISTORY:  She presented to ENT for increased head and neck pain.  Laryngoscope found a supraglottic lesion extending from the lingual surface of the epiglottis down to the left false vocal cord, no involvement of anterior commissure left true vocal cord.  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  She was referred to radiation oncology.  And completed definitive radiation on 04/09/2020.  She has longstanding history of anemia and was referred to medical oncology/hematology for further evaluation.  Medical oncology monitoring TSH.  Follow-up PET  scan showed no residual disease.  INTERVAL HISTORY:  Maria Warren, 64 year old female, presents to the [facility] today for our initial meeting to review the survivorship care plan detailing her treatment course for head & neck cancer, as well as monitoring long-term side effects of that treatment, education regarding health maintenance, screening, and overall wellness and health promotion.     She reports feeling well since completing treatment for head and neck cancer and denies long-term side effects.  She denies weight loss and continues to take food by mouth.  She no longer uses Magic mouthwash and denies dry mouth, difficulty swallowing, or mouth sores.  She continues to be followed by ENT.  She has not been seen by dentist in some years.  She continues to smoke.  TSH was normal by her report.  REVIEW OF SYSTEMS:  Review of Systems  Constitutional: Negative for chills, fever, malaise/fatigue and weight loss.  HENT: Negative for hearing loss, nosebleeds, sore throat and tinnitus.   Eyes: Negative for blurred vision and double vision.  Respiratory: Negative for cough, hemoptysis, shortness of breath and wheezing.   Cardiovascular: Negative for chest pain, palpitations and leg swelling.  Gastrointestinal: Negative for abdominal pain, blood in stool, constipation, diarrhea, melena, nausea and vomiting.  Genitourinary: Negative for dysuria and urgency.  Musculoskeletal: Negative for back pain, falls, joint pain and myalgias.  Skin: Negative for itching and rash.  Neurological: Negative for dizziness, tingling, sensory change, loss of consciousness, weakness and headaches.  Endo/Heme/Allergies: Negative for environmental allergies. Does not bruise/bleed easily.  Psychiatric/Behavioral: Negative for depression. The patient is not nervous/anxious and does not have insomnia.     There is no immunization history on file for this patient.  PAST MEDICAL/SURGICAL HISTORY:  Past Medical  History:  Diagnosis Date  . Anxiety   . Arthritis    knees, lower back  . Cancer (HCC)    larygeal  . Carotid stenosis   . COPD (chronic obstructive pulmonary disease) ([place])   . Coronary artery disease   . Depression   . GERD (gastroesophageal reflux disease)   . Headache    Pain over left eye, then vision in left eye goes black, quickly resolves.  Said Dr. Melony Overly office aware.  . Hypertension   . Hypothyroidism   . Lower extremity edema   . Thyroid disease   . Wears dentures    full upper   Past Surgical History:  Procedure Laterality Date  . CARDIAC CATHETERIZATION    . CATARACT EXTRACTION W/PHACO Right 03/07/2017   Procedure: CATARACT EXTRACTION PHACO AND INTRAOCULAR LENS PLACEMENT ([place])  Right;  Surgeon: Eulogio Bear, MD;  Location: [facility];  Service: Ophthalmology;  Laterality: Right;  . CATARACT EXTRACTION W/PHACO Left 04/11/2017   Procedure: CATARACT EXTRACTION PHACO AND INTRAOCULAR LENS PLACEMENT ([place])  left;  Surgeon: Eulogio Bear, MD;  Location: [facility];  Service: Ophthalmology;  Laterality: Left;  . CESAREAN SECTION     x 2  . COLONOSCOPY N/A 11/24/2015   Procedure: COLONOSCOPY;  Surgeon: Lollie Sails, MD;  Location: [facility] ENDOSCOPY;  Service: Endoscopy;  Laterality: N/A;  . ESOPHAGOGASTRODUODENOSCOPY (EGD) WITH PROPOFOL N/A 11/24/2015   Procedure: ESOPHAGOGASTRODUODENOSCOPY (EGD) WITH PROPOFOL;  Surgeon: Lollie Sails, MD;  Location: [facility] ENDOSCOPY;  Service: Endoscopy;  Laterality: N/A;  . EYE SURGERY    . JOINT REPLACEMENT    . LEFT HEART CATH AND CORONARY ANGIOGRAPHY Left 01/11/2017   Procedure: Left Heart Cath and Coronary Angiography;  Surgeon: Corey Skains, MD;  Location: [facility] CV LAB;  Service: Cardiovascular;  Laterality: Left;  Marland Kitchen MICROLARYNGOSCOPY Left 01/14/2020   Procedure: Suspension Microlaryngoscopy with biopsy;  Surgeon: Carloyn Manner, MD;  Location: [facility] ORS;  Service: ENT;  Laterality: Left;  .  REVERSE SHOULDER ARTHROPLASTY Left 04/26/2018   Procedure: REVERSE SHOULDER ARTHROPLASTY;  Surgeon: Corky Mull, MD;  Location: [facility] ORS;  Service: Orthopedics;  Laterality: Left;  . TOTAL KNEE ARTHROPLASTY Left 01/29/2018   Procedure: TOTAL KNEE ARTHROPLASTY;  Surgeon: Lovell Sheehan, MD;  Location: [facility] ORS;  Service: Orthopedics;  Laterality: Left;  . TOTAL THYROIDECTOMY    . TUBAL LIGATION       ALLERGIES:  Allergies  Allergen Reactions  . Topiramate Nausea Only and Anxiety  . Lyrica [Pregabalin] Nausea Only  . Zoloft [Sertraline Hcl] Nausea Only  . Bupropion Nausea Only    Light headed Light headed      CURRENT MEDICATIONS:  Current Outpatient Medications on File Prior to Visit  Medication Sig Dispense Refill  . acetaminophen (TYLENOL) 650 MG CR tablet Take 650-1,300 mg by mouth every 8 (eight) hours as needed for pain.    Marland Kitchen aspirin EC 81 MG tablet Take 81 mg by mouth daily.    . B Complex-C (B-COMPLEX WITH VITAMIN C) tablet Take 1 tablet by mouth daily.     . Biotin 1000 MCG tablet Take 500 mcg by mouth daily.     . calcitRIOL (ROCALTROL) 0.5 MCG capsule Take 1 mcg by mouth in the morning and at bedtime.    . Cholecalciferol (VITAMIN D3) 50 MCG (2000 UT) TABS Take 2,000 Units by mouth daily.    . clonazePAM (KLONOPIN) 0.5 MG tablet Take 0.5 mg  by mouth daily as needed.    . clopidogrel (PLAVIX) 75 MG tablet Take 1 tablet (75 mg total) by mouth daily. 30 tablet 5  . diphenhydrAMINE (BENADRYL) 25 mg capsule Take 25 mg by mouth at bedtime as needed (for nose/watery eyes).     . ferrous sulfate 325 (65 FE) MG EC tablet Take 1 tablet (325 mg total) by mouth 2 (two) times daily with a meal. (Patient taking differently: Take 325 mg by mouth 2 (two) times daily with a meal. Patient takes 1 QD due to BID cause constipation) 60 tablet 1  . isosorbide mononitrate (IMDUR) 30 MG 24 hr tablet Take 30 mg by mouth daily.    Marland Kitchen levothyroxine (SYNTHROID, LEVOTHROID) 125 MCG tablet Take 137 mcg  by mouth daily before breakfast.     . magic mouthwash w/lidocaine SOLN Take 5 mLs by mouth 4 (four) times daily as needed for mouth pain. Sig: Swish/Swallow 5-10 ml four times a day as needed. Dispense 480 ml. 1RF 480 mL 1  . metoprolol tartrate (LOPRESSOR) 25 MG tablet Take 1 tablet (25 mg total) by mouth 2 (two) times daily for 15 days. 30 tablet 0  . nitroGLYCERIN (NITROSTAT) 0.4 MG SL tablet Place 0.4 mg under the tongue every 5 (five) minutes as needed for chest pain.    . pantoprazole (PROTONIX) 40 MG tablet Take 40 mg by mouth daily before breakfast.    . rosuvastatin (CRESTOR) 40 MG tablet Take 40 mg by mouth daily.    . sucralfate (CARAFATE) 1 g tablet Take 1 tablet (1 g total) by mouth 3 (three) times daily. (Patient not taking: Reported on 06/16/2020) 90 tablet 3  . tiotropium (SPIRIVA) 18 MCG inhalation capsule Place 18 mcg into inhaler and inhale daily.    . vitamin B-12 (CYANOCOBALAMIN) 500 MCG tablet Take 1,000 mcg by mouth daily.     No current facility-administered medications on file prior to visit.       ONCOLOGIC FAMILY HISTORY:  Family History  Problem Relation Age of Onset  . Breast cancer Maternal Aunt   . Heart failure Mother   . Arthritis Mother   . Heart attack Father       Social History   Socioeconomic History  . Marital status: Widowed    Spouse name: Not on file  . Number of children: Not on file  . Years of education: Not on file  . Highest education level: Not on file  Occupational History  . Occupation: disabled   Tobacco Use  . Smoking status: Current Every Day Smoker    Packs/day: 0.50    Years: 25.00    Pack years: 12.50    Types: Cigarettes  . Smokeless tobacco: Never Used  Vaping Use  . Vaping Use: Former  Substance and Sexual Activity  . Alcohol use: Yes    Alcohol/week: 14.0 standard drinks    Types: 14 Cans of beer per week    Comment: 2 cans a night   . Drug use: Not Currently    Types: Cocaine, Marijuana    Comment: drug use  in 1980s  . Sexual activity: Not on file  Other Topics Concern  . Not on file  Social History Narrative  . Not on file   Social Determinants of Health   Financial Resource Strain:   . Difficulty of Paying Living Expenses: Not on file  Food Insecurity:   . Worried About Charity fundraiser in the Last Year: Not on file  .  Ran Out of Food in the Last Year: Not on file  Transportation Needs:   . Lack of Transportation (Medical): Not on file  . Lack of Transportation (Non-Medical): Not on file  Physical Activity:   . Days of Exercise per Week: Not on file  . Minutes of Exercise per Session: Not on file  Stress:   . Feeling of Stress : Not on file  Social Connections:   . Frequency of Communication with Friends and Family: Not on file  . Frequency of Social Gatherings with Friends and Family: Not on file  . Attends Religious Services: Not on file  . Active Member of Clubs or Organizations: Not on file  . Attends Archivist Meetings: Not on file  . Marital Status: Not on file  Intimate Partner Violence:   . Fear of Current or Ex-Partner: Not on file  . Emotionally Abused: Not on file  . Physically Abused: Not on file  . Sexually Abused: Not on file    PHYSICAL EXAMINATION:  Limited due to telemedicine  LABORATORY DATA:  None at this visit.  DIAGNOSTIC IMAGING:  None for this visit.     ASSESSMENT AND PLAN: Ms. 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  1.  Head and neck cancer:  Ms. Krauss has recovered from the effects of her cancer treatments.  We reviewed NCCN guidelines for surveillance including history and physical exam including oral exam every 1 to 3 months for year 1, every 2 to 6 months for year 2, every 4 to 8  months for years 3-5, and every 12 months thereafter. Today, a comprehensive survivorship care plan and treatment summary was reviewed with the patient today detailing her head & neck cancer diagnosis, treatment course, potential late/long-term effects of treatment, appropriate follow-up care with recommendations for the future, and patient education resources.  A copy of this summary, along with a letter will be sent to the patient's primary care provider via mail/fax/In Basket message after today's visit.  Ms. Mcmurry is welcome to return to the Survivorship Clinic in the future, as needed.    2. Distress-we discussed that distress is a multifactorial unpleasant experience of a psychological, social, spiritual, and/or physical nature that may interfere with plans ability to cope effectively with cancer, its physical symptoms, and its treatment.  Distress extends along a continuum, ranging from a common normal feelings of vulnerability, sadness, and fears to problems that can become disabling, such as depression, anxiety, panic, social isolation, and existential and spiritual crisis.  Completed distress screening today with symptoms reported as mild. She can consider self referral to Symptom Management Clinic or Survivorship clinic in the future if she wishes to explore feelings more in depth. She declines at this time.   3. Nutritional status: Ms. Hunt reports that she is currently able to consume adequate nutrition by mouth. She reports that her weight is currently stable and increased from time of diagnosis.  I encouraged her to continue to consume adequate hydration and nutrition.   4. At risk for dysphagia: Given her treatment that included surgery and radiation, she is at risk for chronic dysphagia.  She denies difficulty with swallowing today.  I encouraged her to continue swallowing exercises and we reviewed symptoms of dysphagia. Can consider referral to Orinda Kenner, SLP if  needed in the future.    5.  At risk for neck lymphedema:  When patients with head & neck cancers are treated with surgery and radiation therapy, there is an associated increased risk of neck lymphedema.  Ms. Hirt reports that she does not currently have symptoms and has not required the use of neck compression garments. We reviewed symptoms and if they occur I've asked her to contact the cancer center so that we can refer her to physical therapy for evaluation.    6.  At risk for hypothyroidism: We reviewed that thyroid gland is often affected by treatment.  She has a history of hypothyroidism and continues synthroid. Dr. Tasia Catchings has monitored TSH levels but this can also be done with her PCP who has been adjusting her medications.   7. At risk for tooth decay/dental concerns: After treatment with radiation for head & neck cancers, patients often experience xerostomia which increases their risk of dental caries. Ms. Eppard was encouraged to see his dentist 3-4 times per year.  She does not currently have a dentist and I encouraged her to establish care with one for regular dental/oral exams.   8.  Lung cancer screening: Reviewed eligibility for lung cancer screening with a low-dose chest CT to aid in early detection, provide more effective treatment options, and ultimately improve survival benefits for patients diagnosed with lung cancer.  We reviewed the selection criteria for screening:  . patients 50-80 years. . Active or former smokers who have quit within the last 15 years. . 30+ pack-year history of smoking  . Exclusion criteria - No signs/symptoms of lung cancer (i.e., no recent history of hemoptysis and no unexplained weight loss >15 pounds in the last 6 months). . Willing and healthy enough to undergo biopsies/surgery if needed.  Ms. Broxton currently does meet criteria for lung cancer screening.  Therefore, I have placed a referral for screening today.    9. Tobacco & alcohol use: Ms. Randol continues to smoke.  I  encouraged her to abstain from tobacco as this increases the risk of recurrence in patients with head and neck cancers as well as increases her risk for other cancers.  She voiced understanding but is not interested in smoking cessation.  I offered her referral to quit Smart for smoking cessation visits at the [facility] and she declines.   10. Health maintenance and wellness promotion: Cancer patients who consume a diet rich in fruits and vegetables have better overall health and decreased risk of cancer recurrence. Ms. Dunnigan was encouraged to consume 5-7 servings of fruits and vegetables per day, as tolerated. We reviewed the "Nutrition Rainbow" handout. she was also encouraged to engage in moderate to vigorous exercise for 30 minutes per day most days of the week. We discussed the CARE program, which is designed for cancer survivors to help them become more physically fit after cancer treatments.   11. Support services/counseling: It is not uncommon for this period of the patient's cancer care trajectory to be one of many emotions and stressors.  We discussed an opportunity for her to participate in the next session of Cancer Transitions support group series, designed for patients after they have completed treatment.   Ms. Kath was encouraged to take advantage of our many other support services programs, support groups, and/or counseling in coping with her new life as a cancer survivor after completing anti-cancer treatment.  she was offered support today through active listening and expressive supportive counseling.  she was given information  regarding our available services and encouraged to contact me with any questions or for help enrolling in any of our support group/programs.     Dispo:  - Follow up with Dr. Pryor Ochoa & Dr. Baruch Gouty as scheduled.  -Return to cancer center for follow-up as needed  I discussed the assessment and treatment plan with the patient. The patient was provided an  opportunity to ask questions and all were answered. The patient agreed with the plan and demonstrated an understanding of the instructions.   The patient was advised to call back or seek an in-person evaluation if the symptoms worsen or if the condition fails to improve as anticipated.   I provided 22 minutes of non face-to-face telephone visit time during this encounter, and > 50% was spent counseling as documented under my assessment & plan.   Beckey Rutter, DNP, AGNP-C [place] at [facility]   Note: [place] WHQPR:[phone] Fax: [phone]

## 2020-11-12 ENCOUNTER — Other Ambulatory Visit: Payer: Self-pay | Admitting: General Surgery

## 2020-11-12 NOTE — Progress Notes (Signed)
Subjective:     Patient ID: Maria Warren is a 65 y.o. female.  HPI  The following portions of the patient's history were reviewed and updated as appropriate.  This an established patient is here today for: office visit. She is here for her follow up and discuss rectal exam under anesthesia. Bowels move daily, no bleeding and no rectal pain.  The patient had serial PET/CT exams for prior treatment of cancer of the larynx.  This showed increased activity in the area of the anus.  Examination in December was unremarkable, but a anoscopy could not be completed.  She has identified that her brother and sister-in-law will be able to provide transport and appropriate post procedure observation for outpatient exam under anesthesia.  Review of Systems  Constitutional: Negative for chills and fever.  Respiratory: Negative for cough.        Chief Complaint  Patient presents with  . Follow-up     BP 132/62   Pulse 68   Temp 36.4 C (97.5 F)   Ht 160 cm (5\' 3" )   Wt 87.5 kg (193 lb)   SpO2 97%   BMI 34.19 kg/m       Past Medical History:  Diagnosis Date  . Anxiety   . Arthritis   . CAD (coronary artery disease)   . Carotid stenosis   . Chicken pox   . COPD (chronic obstructive pulmonary disease) (CMS-HCC)   . Depression   . Diverticulosis 11/24/2015  . Emphysema lung (CMS-HCC)   . Erosive gastritis 11/24/2015  . GERD (gastroesophageal reflux disease)   . History of radiation therapy   . Hyperlipidemia   . Hypertension   . Intestinal metaplasia of gastric mucosa 11/24/2015  . Laryngeal cancer (CMS-HCC)   . Lower extremity edema   . Lung disease   . Osteoporosis, post-menopausal   . Redundant colon 11/24/2015  . Sleep apnea   . Thyroid disease    hypothyroidism  . VHD (valvular heart disease)           Past Surgical History:  Procedure Laterality Date  . ARTHROPLASTY TOTAL KNEE Left 01/29/2018  . CARDIAC CATHETERIZATION    .  CATARACT EXTRACTION Left 04/11/2017  . CATARACT EXTRACTION Right 03/07/2017  . cataract surgery Bilateral   . CESAREAN SECTION    . COLONOSCOPY  07/19/2011  . COLONOSCOPY  12/28/2011, 08/08/2012   virtual  . COLONOSCOPY  11/24/2015   Diverticulosis/Redundant colon/Repeat 65yrs/MUS  . EGD  07/19/2011  . EGD  11/24/2015   erosive gastritis/No Repeat/MUS  . LAPAROSCOPIC TUBAL LIGATION    . left heart cath and coronary angiography Left 01/11/2017  . microlaryngoscopy Left 01/14/2020  . REVERSE LEFT TOTAL SHOULDER ARTHROPLASTY Left 04/26/2018   DR.POGGI  . THYROIDECTOMY TOTAL  2001              OB History    Gravida  2   Para  2   Term      Preterm      AB      Living        SAB      IAB      Ectopic      Molar      Multiple      Live Births          Obstetric Comments  Age at first period 71 Age of first pregnancy 89        Social History  Socioeconomic History  . Marital status: Widowed    Spouse name: Not on file  . Number of children: Not on file  . Years of education: Not on file  . Highest education level: Not on file  Occupational History  . Not on file  Tobacco Use  . Smoking status: Current Every Day Smoker    Packs/day: 1.00  . Smokeless tobacco: Never Used  Vaping Use  . Vaping Use: Never used  Substance and Sexual Activity  . Alcohol use: Yes    Alcohol/week: 7.0 standard drinks    Types: 7 Cans of beer per week  . Drug use: No  . Sexual activity: Not on file  Other Topics Concern  . Not on file  Social History Narrative  . Not on file   Social Determinants of Health   Financial Resource Strain: Not on file  Food Insecurity: Not on file  Transportation Needs: Not on file            Allergies  Allergen Reactions  . Topiramate Anxiety and Nausea  . Meloxicam Other (See Comments)  . Lyrica [Pregabalin] Nausea  . Zoloft [Sertraline] Nausea  . Bupropion Nausea    Light  headed Light headed  . Bupropion Hcl Nausea    Light headed    Current Medications        Current Outpatient Medications  Medication Sig Dispense Refill  . acetaminophen (TYLENOL) 650 MG ER tablet Take 1,300 mg by mouth once daily    . aspirin 81 MG EC tablet Take 81 mg by mouth once daily.      Marland Kitchen b complex vitamins tablet Take by mouth Take 1 tablet by mouth daily.    . calcitRIOL (ROCALTROL) 0.25 MCG capsule Take by mouth Take 0.5 mcg by mouth 2 (two) times daily.    . cholecalciferol (VITAMIN D3) 1,000 unit tablet Take 1,000 Units by mouth once daily    . clonazePAM (KLONOPIN) 0.5 MG tablet Take by mouth    . clopidogreL (PLAVIX) 75 mg tablet Take by mouth    . cyanocobalamin (VITAMIN B12) 1000 MCG tablet Take 1,000 mcg by mouth once daily.    . diphenhydrAMINE (BENADRYL) 25 mg capsule Take by mouth    . isosorbide mononitrate (IMDUR) 30 MG ER tablet TAKE 1 TABLET BY MOUTH EVERY DAY 90 tablet 3  . levothyroxine (SYNTHROID, LEVOTHROID) 125 MCG tablet Take by mouth Take 125 mcg by mouth daily before breakfast.    . nitroGLYcerin (NITROSTAT) 0.4 MG SL tablet PLACE 1 TABLET UNDER THE TONGUE EVERY 5 MINUTES AS NEEDED FOR CHEST PAIN. MAY TAKE UP TO 3 DOSES. 25 tablet 0  . pantoprazole (PROTONIX) 40 MG DR tablet TAKE 1 TABLET BY MOUTH DAILY 30 tablet 10  . rosuvastatin (CRESTOR) 40 MG tablet Take by mouth    . tiotropium (SPIRIVA) 18 mcg inhalation capsule Place 18 mcg into inhaler and inhale once daily.       No current facility-administered medications for this visit.           Family History  Problem Relation Age of Onset  . Coronary Artery Disease (Blocked arteries around heart) Mother   . Myocardial Infarction (Heart attack) Mother   . Mental illness Mother   . Osteoporosis (Thinning of bones) Mother   . Rheum arthritis Mother   . Heart failure Mother   . Coronary Artery Disease (Blocked arteries around heart) Father   . Myocardial  Infarction (Heart attack) Father   . Breast cancer  Maternal Aunt          Objective:   Physical Exam Exam conducted with a chaperone present.  Constitutional:      Appearance: Normal appearance.  Cardiovascular:     Rate and Rhythm: Normal rate and regular rhythm.     Pulses: Normal pulses.     Heart sounds: Normal heart sounds.  Pulmonary:     Effort: Pulmonary effort is normal.     Breath sounds: Normal breath sounds.  Musculoskeletal:     Cervical back: Neck supple.  Skin:    General: Skin is warm and dry.  Neurological:     Mental Status: She is alert and oriented to person, place, and time.  Psychiatric:        Mood and Affect: Mood normal.        Behavior: Behavior normal.    Labs and Radiology:   PET/CT of July 02, 2020: 4. Persistent activity about the anus could again be physiologic. If not yet performed direct clinical inspection may be warranted given the level of FDG uptake in this area which is similar to previous imaging.  Digital rectal exam at the time of the October 13, 2020 visit was unremarkable.  The patient was unable to tolerate anoscopy.     Assessment:     Abnormal PET/CT of the anus.    Plan:     Exam under anesthesia scheduled for November 30, 2020.  The patient will stop Plavix 5 days prior to the procedure but will continue her pediatric aspirin.    Entered by Karie Fetch, RN, acting as a scribe for Dr. Hervey Ard, MD.  The documentation recorded by the scribe accurately reflects the service I personally performed and the decisions made by me.   Robert Bellow, MD FACS

## 2020-11-19 ENCOUNTER — Encounter (HOSPITAL_COMMUNITY): Payer: Self-pay | Admitting: Urgent Care

## 2020-11-19 ENCOUNTER — Encounter: Payer: Self-pay | Admitting: General Surgery

## 2020-11-19 ENCOUNTER — Other Ambulatory Visit: Payer: Self-pay

## 2020-11-19 ENCOUNTER — Encounter
Admission: RE | Admit: 2020-11-19 | Discharge: 2020-11-19 | Disposition: A | Discharge status: Home / Self Care | Payer: Medicare Other | Source: Ambulatory Visit | Attending: General Surgery | Admitting: General Surgery

## 2020-11-19 HISTORY — DX: Cardiac murmur, unspecified: R01.1

## 2020-11-19 HISTORY — DX: Anemia, unspecified: D64.9

## 2020-11-19 NOTE — Patient Instructions (Addendum)
Your procedure is scheduled on:11-30-20 MONDAY Report to the Registration Desk on the 1st floor of the [facility]-Then proceed to the 2nd floor Surgery Desk in the [facility] To find out your arrival time, please call [phone] between 1PM - 3PM on:11-27-20 FRIDAY  REMEMBER: Instructions that are not followed completely may result in serious medical risk, up to and including death; or upon the discretion of your surgeon and anesthesiologist your surgery may need to be rescheduled.  Do not eat food after midnight the night before surgery.  No gum chewing, lozengers or hard candies.  You may however, drink CLEAR liquids up to 2 hours before you are scheduled to arrive for your surgery. Do not drink anything within 2 hours of your scheduled arrival time.  Clear liquids include: - water  - apple juice without pulp - gatorade  - black coffee or tea (Do NOT add milk or creamers to the coffee or tea) Do NOT drink anything that is not on this list.  TAKE THESE MEDICATIONS THE MORNING OF SURGERY WITH A SIP OF WATER: -METOPROLOL (LOPRESSOR) -SYNTHROID (LEVOTHYROXINE) -IMDUR (ISOSORBIDE) -PROTONIX (PANTOPRAZOLE)take one the night before and one on the morning of surgery - helps to prevent nausea after surgery.) -YOU MAY TAKE KLONOPIN (CLONAZEPAM) THE MORNING OF SURGERY IF NEEDED  Use inhalers on the day of surgery and bring to the [facility]  Follow recommendations from Cardiologist, Pulmonologist or PCP regarding stopping Aspirin, Coumadin, Plavix, Eliquis, Pradaxa, or Pletal-PLAVIX TO BE STOPPED 5 DAYS PRIOR TO SURGERY PER DR BYRNETT'S INSTRUCTIONS-LAST DOSE OF PLAVIX ON 11-24-20 TUESDAY-CONTINUE YOUR 81 MG ASPIRIN BUT DO NOT TAKE ASPIRIN THE DAY OF SURGERY  One week prior to surgery: Stop Anti-inflammatories (NSAIDS) such as Advil, Aleve, Ibuprofen, Motrin, Naproxen, Naprosyn and Aspirin based products such as Excedrin, Goodys  Powder, BC Powder-OK TO TAKE TYLENOL IF NEEDED  Stop ANY OVER THE COUNTER supplements until after surgery-STOP MELATONIN 7 DAYS PRIOR TO SURGERY-YOU MAY RESUME AFTER SURGERY (However, you may continue taking Vitamin D3, Vitamin B12, and Calcitriol up until the day before surgery.)  No Alcohol for 24 hours before or after surgery.  No Smoking including e-cigarettes for 24 hours prior to surgery.  No chewable tobacco products for at least 6 hours prior to surgery.  No nicotine patches on the day of surgery.  Do not use any "recreational" drugs for at least a week prior to your surgery.  Please be advised that the combination of cocaine and anesthesia may have negative outcomes, up to and including death. If you test positive for cocaine, your surgery will be cancelled.  On the morning of surgery brush your teeth with toothpaste and water, you may rinse your mouth with mouthwash if you wish. Do not swallow any toothpaste or mouthwash.  Do not wear jewelry, make-up, hairpins, clips or nail polish.  Do not wear lotions, powders, or perfumes.   Do not shave body from the neck down 48 hours prior to surgery just in case you cut yourself which could leave a site for infection.  Also, freshly shaved skin may become irritated if using the CHG soap.  Contact lenses, hearing aids and dentures may not be worn into surgery.  Do not bring valuables to the hospital. [facility] is not responsible for any missing/lost belongings or valuables.   Notify your doctor if there is any change in your medical condition (cold, fever, infection).  Wear comfortable clothing (specific to your surgery  type) to the hospital.  Plan for stool softeners for home use; pain medications have a tendency to cause constipation. You can also help prevent constipation by eating foods high in fiber such as fruits and vegetables and drinking plenty of fluids as your diet allows.  After surgery, you can help prevent lung  complications by doing breathing exercises.  Take deep breaths and cough every 1-2 hours. Your doctor may order a device called an Incentive Spirometer to help you take deep breaths. When coughing or sneezing, hold a pillow firmly against your incision with both hands. This is called "splinting." Doing this helps protect your incision. It also decreases belly discomfort.  If you are being admitted to the hospital overnight, leave your suitcase in the car. After surgery it may be brought to your room.  If you are being discharged the day of surgery, you will not be allowed to drive home. You will need a responsible adult (18 years or older) to drive you home and stay with you that night.   If you are taking public transportation, you will need to have a responsible adult (18 years or older) with you. Please confirm with your physician that it is acceptable to use public transportation.   Please call the [facility] Dept. at [phone] if you have any questions about these instructions.  Visitation Policy:  Patients undergoing a surgery or procedure may have one family member or support person with them as long as that person is not COVID-19 positive or experiencing its symptoms.  That person may remain in the waiting area during the procedure.  Inpatient Visitation:    Visiting hours are 7 a.m. to 8 p.m. Patients will be allowed one visitor. The visitor may change daily. The visitor must pass COVID-19 screenings, use hand sanitizer when entering and exiting the patient's room and wear a mask at all times, including in the patient's room. Patients must also wear a mask when staff or their visitor are in the room. Masking is required regardless of vaccination status. Systemwide, no visitors 17 or younger.

## 2020-11-19 NOTE — Progress Notes (Signed)
[facility] Perioperative Services  Pre-Admission/Anesthesia Testing Clinical Review  Date: 11/23/20  Patient Demographics:  Name: Maria Warren DOB:   1956/02/26 MRN:   [id number]  Planned Surgical Procedure(s):    Case: [id number] Date: 11/30/20   Procedure: EXAM UNDER ANESTHESIA (N/A ) (canceled)   Anesthesia type: General   Pre-op diagnosis: abnormal PET scan   Location: [facility] ORS FOR ANESTHESIA GROUP   Surgeons: Robert Bellow, MD     NOTE: Available PAT nursing documentation and vital signs have been reviewed. Clinical nursing staff has updated patient's PMH/PSHx, current medication list, and drug allergies/intolerances to ensure comprehensive history available to assist in medical decision making as it pertains to the aforementioned surgical procedure and anticipated anesthetic course.   Clinical Discussion:  Maria Warren is a 65 y.o. female who is submitted for pre-surgical anesthesia review and clearance prior to her undergoing the above procedure. Patient is a Current Smoker (12.5 pack years). Pertinent PMH includes: CAD, cardiac murmur, bilateral carotid stenosis, HTN, laryngeal cancer (s/p XRT), hypothyroidism, COPD, GERD (on daily PPI therapy), OA, anxiety (on BZO therapy), depression  Patient is followed by cardiology Nehemiah Massed, MD). She was last seen in the cardiology clinic on 07/09/2020; notes reviewed.  At the time of her clinic visit, patient noted to be doing well overall from a cardiovascular perspective. She denied chest pain, shortness of breath, orthopnea, PND, palpitations, peripheral edema, vertiginous symptoms, and presyncope/syncope.  Patient with a past medical history significant for cardiovascular disease. Patient underwent diagnostic left heart catheterization on 01/11/2017 that revealed mild three-vessel CAD with no significant stenosis of the coronaries; medical management was recommended. She underwent an ECG stress test on 01/29/2019  that revealed an LVEF of 80%.  TTE performed on 01/30/2019 revealed a normal left ventricular systolic function with mild LVH, mild valvular regurgitation and an EF of 50%.  Myocardial perfusion imaging study performed on 01/30/2019 revealed no evidence of stress-induced myocardial ischemia or arrhythmia; LVEF 80%. Patient on GDMT for her HTN and HLD diagnoses.  Blood pressure well controlled at 122/76 on currently prescribed nitrate and beta-blocker therapy.  She is on a statin for HLD.  Smoking cessation and weight loss were recommended.  No changes were made to patient's medication regimen.  Patient to follow-up with outpatient cardiology in 6 months or sooner if needed.  She is also followed by vascular surgery Lucky Cowboy, MD) for known carotid artery disease. She was last seen in clinic on 06/16/2020; notes reviewed. Patient doing well overall. No focal neurological symptoms, headaches, or seizure like activity. Carotid duplex performed on 06/16/2020 revealed velocities consistent with a 60-79% right ICA stenosis and a total occlusion of the left ICA; vertebral arteries demonstrate antegrade flow and there were normal flow hemodynamics in the bilateral subclavians (see full interpretation of cardiovascular test below).  Given her significant carotid artery disease, patient is on DAPT therapy (ASA + clopidogrel) daily; compliant with therapy with no evidence of GI bleeding. Patient to follow-up with outpatient vascular surgery in 6 months or sooner if needed.  Patient scheduled to undergo an EUA +/- anorectal biopsy on 11/30/2020 with Dr. Hervey Ard.  Given patient's past medical history significant for cardiovascular disease, presurgical cardiac and vascular surgery clearances were sought by the PAT team.  Medical clearance form faxed to vascular and I am currently awaiting return receipt. Received call from cardiology advising that patient would need to be seen in clinic for /- additional testing prior to  clearance. Patient is scheduled to  be seen for preoperative clearance on 02/02/222.   Again, this patient is on daily DAPT (ASA + clopidogrel). She has been instructed on recommendations for holding her clopidogrel for 5 days prior to her procedure with plans to restart as soon as deemed safe to do so by attending surgeon.  She went to continue her daily low-dose ASA throughout her perioperative course.    She denies previous perioperative complications with anesthesia. She underwent a general anesthetic course here (ASA III) in 12/2019 with no documented complications.   Vitals with BMI 09/21/2020 09/07/2020 09/07/2020  Height - - -  Weight 191 lbs 2 oz - -  BMI 33.86 - -  Systolic 157 156 [id number]  Diastolic 86 87 91  Pulse 72 81 81  Some encounter information is confidential and restricted. Go to Review Flowsheets activity to see all data.    Providers/Specialists:   NOTE: Primary physician provider listed below. Patient may have been seen by APP or partner within same practice.   PROVIDER ROLE / SPECIALTY LAST OV  Lemar LivingsByrnett, Merrily PewJeffrey W, MD General Surgery  11/12/2020  Associates, [facility] Primary Care Provider  ???  Arnoldo HookerKowalski, Bruce, MD Cardiology  07/09/2020  Festus Barrenew, Jason, MD Vascular Surgery  06/16/2020  Carmina Millerhrystal, Glenn, MD Radiation Oncology  09/21/2020  Rickard PatienceYu, Zhou, MD Medical Oncology  04/27/2020   Allergies:  Topiramate, Lyrica [pregabalin], Zoloft [sertraline hcl], and Bupropion  Current Home Medications:   No current facility-administered medications for this encounter.    acetaminophen (TYLENOL) 650 MG CR tablet   albuterol (VENTOLIN HFA) 108 (90 Base) MCG/ACT inhaler   aspirin EC 81 MG tablet   calcitRIOL (ROCALTROL) 0.5 MCG capsule   Cholecalciferol (VITAMIN D3) 50 MCG (2000 UT) TABS   clonazePAM (KLONOPIN) 0.5 MG tablet   clopidogrel (PLAVIX) 75 MG tablet   Cyanocobalamin (B-12) 2500 MCG TABS   diphenhydrAMINE (BENADRYL) 25 mg capsule   isosorbide mononitrate  (IMDUR) 30 MG 24 hr tablet   levothyroxine (SYNTHROID, LEVOTHROID) 125 MCG tablet   Melatonin 10 MG CAPS   metoprolol tartrate (LOPRESSOR) 25 MG tablet   nitroGLYCERIN (NITROSTAT) 0.4 MG SL tablet   pantoprazole (PROTONIX) 40 MG tablet   rosuvastatin (CRESTOR) 40 MG tablet   tiotropium (SPIRIVA) 18 MCG inhalation capsule   History:   Past Medical History:  Diagnosis Date   Anemia    Anxiety    Arthritis    knees, lower back   Carotid stenosis    100% blockage left carotid and 70% blockage in right   COPD (chronic obstructive pulmonary disease) (HCC)    Coronary artery disease    4 blockages in heart   Depression    GERD (gastroesophageal reflux disease)    Headache    Pain over left eye, then vision in left eye goes black, quickly resolves.  Said Dr. Elmer BalesKing's office aware.   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; s/p XRT   Lower extremity edema    Thyroid disease    Wears dentures    full upper   Past Surgical History:  Procedure Laterality Date   CARDIAC CATHETERIZATION     CATARACT EXTRACTION W/PHACO Right 03/07/2017   Procedure: CATARACT EXTRACTION PHACO AND INTRAOCULAR LENS PLACEMENT (IOC)  Right;  Surgeon: Nevada CraneKing, Bradley Mark, MD;  [facility];  Service: Ophthalmology;  Laterality: Right;   CATARACT EXTRACTION W/PHACO Left 04/11/2017   Procedure: CATARACT  EXTRACTION PHACO AND INTRAOCULAR LENS PLACEMENT ([place])  left;  Surgeon: Eulogio Bear, MD;  Location: [place];  Service: Ophthalmology;  Laterality: Left;   CESAREAN SECTION     x 2   COLONOSCOPY N/A 11/24/2015   Procedure: COLONOSCOPY;  Surgeon: Lollie Sails, MD;  Location: [facility] ENDOSCOPY;  Service: Endoscopy;  Laterality: N/A;   ESOPHAGOGASTRODUODENOSCOPY (EGD) WITH PROPOFOL N/A 11/24/2015   Procedure: ESOPHAGOGASTRODUODENOSCOPY (EGD) WITH PROPOFOL;  Surgeon: Lollie Sails, MD;   Location: [facility] ENDOSCOPY;  Service: Endoscopy;  Laterality: N/A;   EYE SURGERY     JOINT REPLACEMENT     LEFT HEART CATH AND CORONARY ANGIOGRAPHY Left 01/11/2017   Procedure: Left Heart Cath and Coronary Angiography;  Surgeon: Corey Skains, MD;  Location: [place] [facility] LAB;  Service: Cardiovascular;  Laterality: Left;   MICROLARYNGOSCOPY Left 01/14/2020   Procedure: Suspension Microlaryngoscopy with biopsy;  Surgeon: Carloyn Manner, MD;  Location: [facility] ORS;  Service: ENT;  Laterality: Left;   REVERSE SHOULDER ARTHROPLASTY Left 04/26/2018   Procedure: REVERSE SHOULDER ARTHROPLASTY;  Surgeon: Corky Mull, MD;  Location: [facility] ORS;  Service: Orthopedics;  Laterality: Left;   TOTAL KNEE ARTHROPLASTY Left 01/29/2018   Procedure: TOTAL KNEE ARTHROPLASTY;  Surgeon: Lovell Sheehan, MD;  Location: [facility] ORS;  Service: Orthopedics;  Laterality: Left;   TOTAL THYROIDECTOMY     TUBAL LIGATION     Family History  Problem Relation Age of Onset   Breast cancer Maternal Aunt    Heart failure Mother    Arthritis Mother    Heart attack Father    Social History   Tobacco Use   Smoking status: Current Every Day Smoker    Packs/day: 0.50    Years: 25.00    Pack years: 12.50    Types: Cigarettes   Smokeless tobacco: Never Used  Vaping Use   Vaping Use: Former  Substance Use Topics   Alcohol use: Yes    Alcohol/week: 14.0 standard drinks    Types: 14 Cans of beer per week    Comment: 3-4 beers daily   Drug use: Not Currently    Types: Cocaine, Marijuana, "Crack" cocaine    Comment: drug use in 1980s-crack in 2018 but stopped    Pertinent Clinical Results:  LABS: Labs reviewed: Acceptable for surgery.  No visits with results within 3 Day(s) from this visit.  Latest known visit with results is:  Admission on 09/07/2020, Discharged on 09/07/2020  Component Date Value Ref Range Status   WBC 09/07/2020 8.8  4.0 - 10.5 K/uL Final   RBC 09/07/2020 3.73* 3.87 - 5.11 MIL/uL Final    Hemoglobin 09/07/2020 12.4  12.0 - 15.0 g/dL Final   HCT 09/07/2020 36.5  36.0 - 46.0 % Final   MCV 09/07/2020 97.9  80.0 - 100.0 fL Final   MCH 09/07/2020 33.2  26.0 - 34.0 pg Final   MCHC 09/07/2020 34.0  30.0 - 36.0 g/dL Final   RDW 09/07/2020 12.2  11.5 - 15.5 % Final   Platelets 09/07/2020 237  150 - 400 K/uL Final   nRBC 09/07/2020 0.0  0.0 - 0.2 % Final   Neutrophils Relative % 09/07/2020 77  % Final   Neutro Abs 09/07/2020 6.8  1.7 - 7.7 K/uL Final   Lymphocytes Relative 09/07/2020 11  % Final   Lymphs Abs 09/07/2020 1.0  0.7 - 4.0 K/uL Final   Monocytes Relative 09/07/2020 10  % Final   Monocytes Absolute 09/07/2020 0.9  0.1 - 1.0  K/uL Final   Eosinophils Relative 09/07/2020 1  % Final   Eosinophils Absolute 09/07/2020 0.1  0.0 - 0.5 K/uL Final   Basophils Relative 09/07/2020 1  % Final   Basophils Absolute 09/07/2020 0.0  0.0 - 0.1 K/uL Final   Immature Granulocytes 09/07/2020 0  % Final   Abs Immature Granulocytes 09/07/2020 0.03  0.00 - 0.07 K/uL Final   Performed at [facility], [place], [place] [address]   Sodium 09/07/2020 143  135 - 145 mmol/L Final   Potassium 09/07/2020 3.8  3.5 - 5.1 mmol/L Final   Chloride 09/07/2020 106  98 - 111 mmol/L Final   CO2 09/07/2020 29  22 - 32 mmol/L Final   Glucose, Bld 09/07/2020 132* 70 - 99 mg/dL Final   Glucose reference range applies only to samples taken after fasting for at least 8 hours.   BUN 09/07/2020 10  8 - 23 mg/dL Final   Creatinine, Ser 09/07/2020 0.69  0.44 - 1.00 mg/dL Final   Calcium 09/07/2020 7.9* 8.9 - 10.3 mg/dL Final   GFR, Estimated 09/07/2020 >60  >60 mL/min Final   Comment: (NOTE) Calculated using the CKD-EPI Creatinine Equation (2021)    Anion gap 09/07/2020 8  5 - 15 Final   Performed at [facility], [address] [place], [place] [address]   Prothrombin Time 09/07/2020 11.9  11.4 - 15.2 seconds Final   INR 09/07/2020 0.9  0.8 - 1.2 Final   Comment: (NOTE) INR  goal varies based on device and disease states. Performed at [facility], [address] [place], [place] [address]    aPTT 09/07/2020 28  24 - 36 seconds Final   Performed at [facility], [address] [place], [place] [address]    ECG: Date: 11/23/2020 Rate: 59 bpm Rhythm: sinus bradycardia; IRBBB Intervals: PR 168 ms. QRS 94 ms. QTc 457 ms. ST segment and T wave changes: No evidence of acute ST segment elevation or depression Comparison: Similar to previous tracing obtained on 12/26/2019   IMAGING / PROCEDURES: BILATERAL VASCULAR CAROTID DUPLEX performed on 06/16/2020 Right carotid: velocities in the right ICA are consistent with a 60-79% stenosis Left carotid: evidence consistent with a total occlusion of the left ICA Vertebrals: bilateral vertebral arteries demonstrate antegrade flow Subclavians: normal flow hemodynamics were seen in the bilateral subclavian arteries  LEXISCAN performed on 01/30/2019 LVEF 80% Regional wall motion reveals normal myocardial thickening and wall motion There are no artifacts noted Left ventricular cavity size is normal No evidence of stress-induced myocardial ischemia or arrhythmia The quality of the study is good  ECHOCARDIOGRAM performed on 01/30/2019 LVEF 50% Normal left ventricular systolic function with mild LVH Normal right ventricular systolic function Trivial MR and TR Mild PR No AR No valvular stenosis No evidence of pericardial effusion  LOWER EXTREMITY ABI/PVR COMPLETE AT REST performed on 12/18/2018 Right ABI of 1.02 Left ABI 1.12 ABIs within normal limits bilaterally  LEFT HEART CATHETERIZATION AND CORONARY ANGIOGRAPHY done on 01/11/2017 Mild three-vessel CAD No significant stenosis of the coronaries 35% stenosis proximal RCA 20% stenosis mid RCA-2 25% stenosis of the mid RCA-1 45% stenosis proximal circumflex to mid circumflex 30% stenosis distal circumflex 10% stenosis ostial LM to LM 25%  stenosis proximal LAD to mid LAD Normal LVEF of 60%  Impression and Plan:  Maria Warren has been referred for pre-anesthesia review and clearance prior to her undergoing the planned anesthetic and procedural courses. Available labs, pertinent testing,  and imaging results were personally reviewed by me. Patient pending vascular and cardiology clearances at this time. Primary attending surgeon Bary Castilla, MD) has bee made aware and wishes to postpone the procedure pending the aforementioned clearances. Patient to be made aware by surgeon's office. Based on clinical review performed today (11/23/20), based on the fact that clearances are still pending, she will NOT  be able to proceed with the planned surgical intervention. Patient sees cardiology on 12/02/2020 for clearance +/- additional testing per Dr. Alveria Apley clinical staff.  Honor Loh, MSN, APRN, FNP-C, CEN [facility]  Peri-operative Services Nurse Practitioner Phone: [phone] 11/23/20 3:03 PM  NOTE: This note has been prepared using Dragon dictation software. Despite my best ability to proofread, there is always the potential that unintentional transcriptional errors may still occur from this process.

## 2020-11-23 ENCOUNTER — Other Ambulatory Visit (INDEPENDENT_AMBULATORY_CARE_PROVIDER_SITE_OTHER): Payer: Self-pay | Admitting: Nurse Practitioner

## 2020-11-24 ENCOUNTER — Inpatient Hospital Stay: Admission: RE | Admit: 2020-11-24 | Payer: Medicare Other | Source: Ambulatory Visit

## 2020-11-26 ENCOUNTER — Other Ambulatory Visit: Payer: Medicare Other

## 2020-11-30 ENCOUNTER — Ambulatory Visit: Admission: RE | Admit: 2020-11-30 | Payer: Medicare Other | Source: Home / Self Care | Admitting: General Surgery

## 2020-11-30 ENCOUNTER — Encounter: Admission: RE | Payer: Self-pay | Source: Home / Self Care

## 2020-11-30 SURGERY — EXAM UNDER ANESTHESIA
Anesthesia: General

## 2020-12-03 ENCOUNTER — Other Ambulatory Visit (INDEPENDENT_AMBULATORY_CARE_PROVIDER_SITE_OTHER): Payer: Self-pay | Admitting: Nurse Practitioner

## 2020-12-03 DIAGNOSIS — I6523 Occlusion and stenosis of bilateral carotid arteries: Secondary | ICD-10-CM

## 2020-12-18 ENCOUNTER — Other Ambulatory Visit: Payer: Self-pay

## 2020-12-18 ENCOUNTER — Ambulatory Visit (INDEPENDENT_AMBULATORY_CARE_PROVIDER_SITE_OTHER): Payer: Medicare Other | Admitting: Vascular Surgery

## 2020-12-18 ENCOUNTER — Encounter (INDEPENDENT_AMBULATORY_CARE_PROVIDER_SITE_OTHER): Payer: Self-pay | Admitting: Vascular Surgery

## 2020-12-18 ENCOUNTER — Ambulatory Visit (INDEPENDENT_AMBULATORY_CARE_PROVIDER_SITE_OTHER): Payer: Medicare Other

## 2020-12-18 VITALS — BP 134/75 | HR 77 | Resp 16 | Wt 189.0 lb

## 2020-12-18 DIAGNOSIS — I6523 Occlusion and stenosis of bilateral carotid arteries: Secondary | ICD-10-CM

## 2020-12-18 DIAGNOSIS — I10 Essential (primary) hypertension: Secondary | ICD-10-CM

## 2020-12-18 DIAGNOSIS — I2510 Atherosclerotic heart disease of native coronary artery without angina pectoris: Secondary | ICD-10-CM | POA: Diagnosis not present

## 2020-12-18 NOTE — Assessment & Plan Note (Signed)
Carotid duplex reveals stable 40 to 59% right ICA stenosis and a known left carotid occlusion.  No role for surgery or intervention at this level.  Continue current medical regimen.  Recheck in 6 months

## 2020-12-18 NOTE — Progress Notes (Signed)
MRN : [id number]  Maria Warren is a 65 y.o. (06-25-1956) female who presents with chief complaint of  Chief Complaint  Patient presents with  . Carotid    6 month ultrasound follow up  .  History of Present Illness: Patient returns in follow-up of her carotid disease.  She has some oncologic issues going on but no neurologic issues to her knowledge.  Denies any focal neurologic symptoms. Specifically, the patient denies amaurosis fugax, speech or swallowing difficulties, or arm or leg weakness or numbness.  Carotid duplex reveals stable 40 to 59% right ICA stenosis and a known left carotid occlusion.  Current Outpatient Medications  Medication Sig Dispense Refill  . acetaminophen (TYLENOL) 650 MG CR tablet Take 1,300 mg by mouth every 8 (eight) hours as needed for pain.    Marland Kitchen albuterol (VENTOLIN HFA) 108 (90 Base) MCG/ACT inhaler Inhale 2 puffs into the lungs every 6 (six) hours as needed for wheezing or shortness of breath.    Marland Kitchen aspirin EC 81 MG tablet Take 81 mg by mouth daily.    . calcitRIOL (ROCALTROL) 0.5 MCG capsule Take 1 mcg by mouth in the morning and at bedtime.    . Cholecalciferol (VITAMIN D3) 50 MCG (2000 UT) TABS Take 2,000 Units by mouth daily.    . clonazePAM (KLONOPIN) 0.5 MG tablet Take 0.25 mg by mouth 2 (two) times daily as needed for anxiety.    . clopidogrel (PLAVIX) 75 MG tablet TAKE 1 TABLET BY MOUTH DAILY 30 tablet 5  . Cyanocobalamin (B-12) 2500 MCG TABS Take 2,500 mg by mouth daily.    . diphenhydrAMINE (BENADRYL) 25 mg capsule Take 50 mg by mouth every 4 (four) hours as needed.    . isosorbide mononitrate (IMDUR) 30 MG 24 hr tablet Take 30 mg by mouth every morning.    Marland Kitchen levothyroxine (SYNTHROID, LEVOTHROID) 125 MCG tablet Take 125 mcg by mouth daily before breakfast.    . Melatonin 10 MG CAPS Take 10 mg by mouth at bedtime.    . metoprolol tartrate (LOPRESSOR) 25 MG tablet Take 1 tablet (25 mg total) by mouth 2 (two) times daily for 15 days. 30 tablet 0  .  nitroGLYCERIN (NITROSTAT) 0.4 MG SL tablet Place 0.4 mg under the tongue every 5 (five) minutes as needed for chest pain.    . pantoprazole (PROTONIX) 40 MG tablet Take 40 mg by mouth daily before breakfast.    . rosuvastatin (CRESTOR) 40 MG tablet Take 40 mg by mouth at bedtime.    Marland Kitchen tiotropium (SPIRIVA) 18 MCG inhalation capsule Place 18 mcg into inhaler and inhale as needed.     No current facility-administered medications for this visit.    Past Medical History:  Diagnosis Date  . Anemia   . Anxiety   . Arthritis    knees, lower back  . Carotid stenosis    100% blockage left carotid and 70% blockage in right  . COPD (chronic obstructive pulmonary disease) ([place])   . Coronary artery disease    4 blockages in heart  . Depression   . GERD (gastroesophageal reflux disease)   . Headache    Pain over left eye, then vision in left eye goes black, quickly resolves.  Said Dr. Melony Overly office aware.  Marland Kitchen Heart murmur   . Hypertension   . Hypothyroidism   . 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 Lower extremity edema   . Thyroid disease   .  Wears dentures    full upper    Past Surgical History:  Procedure Laterality Date  . CARDIAC CATHETERIZATION    . CATARACT EXTRACTION W/PHACO Right 03/07/2017   Procedure: CATARACT EXTRACTION PHACO AND INTRAOCULAR LENS PLACEMENT ([place])  Right;  Surgeon: Eulogio Bear, MD;  Location: [facility];  Service: Ophthalmology;  Laterality: Right;  . CATARACT EXTRACTION W/PHACO Left 04/11/2017   Procedure: CATARACT EXTRACTION PHACO AND INTRAOCULAR LENS PLACEMENT ([place])  left;  Surgeon: Eulogio Bear, MD;  Location: [facility];  Service: Ophthalmology;  Laterality: Left;  . CESAREAN SECTION     x 2  . COLONOSCOPY N/A 11/24/2015   Procedure: COLONOSCOPY;  Surgeon: Lollie Sails, MD;  Location: [facility] ENDOSCOPY;  Service: Endoscopy;  Laterality: N/A;  . ESOPHAGOGASTRODUODENOSCOPY  (EGD) WITH PROPOFOL N/A 11/24/2015   Procedure: ESOPHAGOGASTRODUODENOSCOPY (EGD) WITH PROPOFOL;  Surgeon: Lollie Sails, MD;  Location: [facility] ENDOSCOPY;  Service: Endoscopy;  Laterality: N/A;  . EYE SURGERY    . JOINT REPLACEMENT    . LEFT HEART CATH AND CORONARY ANGIOGRAPHY Left 01/11/2017   Procedure: Left Heart Cath and Coronary Angiography;  Surgeon: Corey Skains, MD;  Location: [facility] CV LAB;  Service: Cardiovascular;  Laterality: Left;  Marland Kitchen MICROLARYNGOSCOPY Left 01/14/2020   Procedure: Suspension Microlaryngoscopy with biopsy;  Surgeon: Carloyn Manner, MD;  Location: [facility] ORS;  Service: ENT;  Laterality: Left;  . REVERSE SHOULDER ARTHROPLASTY Left 04/26/2018   Procedure: REVERSE SHOULDER ARTHROPLASTY;  Surgeon: Corky Mull, MD;  Location: [facility] ORS;  Service: Orthopedics;  Laterality: Left;  . TOTAL KNEE ARTHROPLASTY Left 01/29/2018   Procedure: TOTAL KNEE ARTHROPLASTY;  Surgeon: Lovell Sheehan, MD;  Location: [facility] ORS;  Service: Orthopedics;  Laterality: Left;  . TOTAL THYROIDECTOMY    . TUBAL LIGATION       Social History   Tobacco Use  . Smoking status: Current Every Day Smoker    Packs/day: 0.50    Years: 25.00    Pack years: 12.50    Types: Cigarettes  . Smokeless tobacco: Never Used  Vaping Use  . Vaping Use: Former  Substance Use Topics  . Alcohol use: Yes    Alcohol/week: 14.0 standard drinks    Types: 14 Cans of beer per week    Comment: 3-4 beers daily  . Drug use: Not Currently    Types: Cocaine, Marijuana, "Crack" cocaine    Comment: drug use in 1980s-crack in 2018 but stopped       Family History  Problem Relation Age of Onset  . Breast cancer Maternal Aunt   . Heart failure Mother   . Arthritis Mother   . Heart attack Father      Allergies  Allergen Reactions  . Topiramate Nausea Only and Anxiety  . Lyrica [Pregabalin] Nausea Only  . Zoloft [Sertraline Hcl] Nausea Only  . Bupropion Nausea Only    Light headed     REVIEW OF  SYSTEMS(Negative unless checked)  Constitutional: [] ???Weight loss[] ???Fever[] ???Chills Cardiac:[] ???Chest pain[] ???Chest pressure[x] ???Palpitations [] ???Shortness of breath when laying flat [] ???Shortness of breath at rest [x] ???Shortness of breath with exertion. Vascular: [] ???Pain in legs with walking[] ???Pain in legsat rest[] ???Pain in legs when laying flat [] ???Claudication [] ???Pain in feet when walking [] ???Pain in feet at rest [] ???Pain in feet when laying flat [] ???History of DVT [] ???Phlebitis [] ???Swelling in legs [] ???Varicose veins [] ???Non-healing ulcers Pulmonary: [] ???Uses home oxygen [] ???Productive cough[] ???Hemoptysis [] ???Wheeze [x] ???COPD [] ???Asthma Neurologic: [] ???Dizziness [] ???Blackouts [] ???Seizures [] ???History of stroke [] ???History of TIA[] ???Aphasia [] ???Temporary blindness[] ???Dysphagia [] ???Weaknessor numbness in arms [] ???Weakness or  numbnessin legs Musculoskeletal: [x] ???Arthritis [] ???Joint swelling [x] ???Joint pain [] ???Low back pain Hematologic:[] ???Easy bruising[] ???Easy bleeding [] ???Hypercoagulable state [] ???Anemic [] ???Hepatitis Gastrointestinal:[] ???Blood in stool[] ???Vomiting blood[x] ???Gastroesophageal reflux/heartburn[] ???Abdominal pain Genitourinary: [] ???Chronic kidney disease [] ???Difficulturination [] ???Frequenturination [] ???Burning with urination[] ???Hematuria Skin: [] ???Rashes [] ???Ulcers [] ???Wounds Psychological: [x] ???History of anxiety[x] ???History of major depression.   Physical Examination  Vitals:   12/18/20 1102  BP: 134/75  Pulse: 77  Resp: 16  Weight: 189 lb (85.7 kg)   Body mass index is 33.48 kg/m. Gen:  WD/WN, NAD. Appears older than stated age. Head: [place]/AT, No temporalis wasting. Ear/Nose/Throat: Hearing grossly intact, nares w/o erythema or drainage, trachea midline Eyes: Conjunctiva clear. Sclera  non-icteric Neck: Supple.  Bilateral carotid bruit  Pulmonary:  Good air movement, equal and clear to auscultation bilaterally.  Cardiac: RRR, No JVD Vascular:  Vessel Right Left  Radial Palpable Palpable           Musculoskeletal: M/S 5/5 throughout.  No deformity or atrophy. Mild LE edema. Neurologic: CN 2-12 intact. Sensation grossly intact in extremities.  Symmetrical.  Speech is fluent. Motor exam as listed above. Psychiatric: Judgment intact, Mood & affect appropriate for pt's clinical situation. Dermatologic: No rashes or ulcers noted.  No cellulitis or open wounds.      CBC Lab Results  Component Value Date   WBC 8.8 09/07/2020   HGB 12.4 09/07/2020   HCT 36.5 09/07/2020   MCV 97.9 09/07/2020   PLT 237 09/07/2020    BMET    Component Value Date/Time   NA 143 09/07/2020 0650   NA 140 10/08/2014 0523   K 3.8 09/07/2020 0650   K 3.5 10/08/2014 0523   CL 106 09/07/2020 0650   CL 101 10/08/2014 0523   CO2 29 09/07/2020 0650   CO2 34 (H) 10/08/2014 0523   GLUCOSE 132 (H) 09/07/2020 0650   GLUCOSE 115 (H) 10/08/2014 0523   BUN 10 09/07/2020 0650   BUN 10 10/08/2014 0523   CREATININE 0.69 09/07/2020 0650   CREATININE 1.01 10/08/2014 0523   CALCIUM 7.9 (L) 09/07/2020 0650   CALCIUM 6.5 (LL) 10/08/2014 0523   GFRNONAA >60 09/07/2020 0650   GFRNONAA 60 (L) 10/08/2014 0523   GFRNONAA >60 09/22/2013 1124   GFRAA >60 04/03/2020 1451   GFRAA >60 10/08/2014 0523   GFRAA >60 09/22/2013 1124   CrCl cannot be calculated (Patient's most recent lab result is older than the maximum 21 days allowed.).  COAG Lab Results  Component Value Date   INR 0.9 09/07/2020   INR 0.87 04/20/2018   INR 0.93 01/17/2018    Radiology No results found.   Assessment/Plan Hypertension blood pressure control important in reducing the progression of atherosclerotic disease. On appropriate oral medications.   Coronary artery disease Continue cardiac and antihypertensive  medications as already ordered and reviewed, no changes at this time. Continue statin as ordered and reviewed, no changes at this time Nitrates PRN for chest pain  Bilateral carotid artery stenosis Carotid duplex reveals stable 40 to 59% right ICA stenosis and a known left carotid occlusion.  No role for surgery or intervention at this level.  Continue current medical regimen.  Recheck in 6 months    Leotis Pain, MD  12/18/2020 11:43 AM    This note was created with Dragon medical transcription system.  Any errors from dictation are purely unintentional

## 2020-12-31 ENCOUNTER — Other Ambulatory Visit: Payer: Self-pay | Admitting: Nurse Practitioner

## 2020-12-31 DIAGNOSIS — Z1231 Encounter for screening mammogram for malignant neoplasm of breast: Secondary | ICD-10-CM

## 2021-01-25 ENCOUNTER — Other Ambulatory Visit: Payer: Self-pay | Admitting: General Surgery

## 2021-01-25 NOTE — Progress Notes (Signed)
Subjective:     Patient ID: Maria Warren is a 65 y.o. female.  HPI  The following portions of the patient's history were reviewed and updated as appropriate.  This an established patient is here today for: office visit. The patient is here today for evaluation for of blood in stool.  She had called yesterday reporting bright red blood on the toilet paper.  Patient reports stools are solid. She reports it first started on 12-22-20 and has continued since. Patient is on Plavix and an 81 mg aspirin. The patient was originally scheduled for an exam under anesthesia on 11-30-20 but cancelled because she stated that she needed cardiology clearance. The patient is scheduled for an ECHO on 01-07-21 and has a follow up with Hassell Done through cardiology on 01-11-21.   The patient denies any chest pain or change in her baseline dyspnea on exertion.      Chief Complaint  Patient presents with  . Follow-up    blood in stool      BP (!) 140/90   Pulse 82   Temp 36.3 C (97.4 F)   Ht 160 cm (5\' 3" )   Wt 87.5 kg (193 lb)   SpO2 95%   BMI 34.19 kg/m       Past Medical History:  Diagnosis Date  . Anxiety   . Arthritis   . CAD (coronary artery disease)   . Carotid stenosis   . Chicken pox   . COPD (chronic obstructive pulmonary disease) (CMS-HCC)   . Depression   . Diverticulosis 11/24/2015  . Emphysema lung (CMS-HCC)   . Erosive gastritis 11/24/2015  . GERD (gastroesophageal reflux disease)   . History of radiation therapy   . Hyperlipidemia   . Hypertension   . Intestinal metaplasia of gastric mucosa 11/24/2015  . Laryngeal cancer (CMS-HCC)   . Lower extremity edema   . Lung disease   . Osteoporosis, post-menopausal   . Redundant colon 11/24/2015  . Sleep apnea   . Thyroid disease    hypothyroidism  . VHD (valvular heart disease)           Past Surgical History:  Procedure Laterality Date  . ARTHROPLASTY TOTAL KNEE Left 01/29/2018  . CARDIAC  CATHETERIZATION    . CATARACT EXTRACTION Left 04/11/2017  . CATARACT EXTRACTION Right 03/07/2017  . cataract surgery Bilateral   . CESAREAN SECTION    . COLONOSCOPY  07/19/2011  . COLONOSCOPY  12/28/2011, 08/08/2012   virtual  . COLONOSCOPY  11/24/2015   Diverticulosis/Redundant colon/Repeat 31yrs/MUS  . EGD  07/19/2011  . EGD  11/24/2015   erosive gastritis/No Repeat/MUS  . LAPAROSCOPIC TUBAL LIGATION    . left heart cath and coronary angiography Left 01/11/2017  . microlaryngoscopy Left 01/14/2020  . REVERSE LEFT TOTAL SHOULDER ARTHROPLASTY Left 04/26/2018   [id number]  . THYROIDECTOMY TOTAL  2001              OB History    Gravida  2   Para  2   Term      Preterm      AB      Living        SAB      IAB      Ectopic      Molar      Multiple      Live Births          Obstetric Comments  Age at first period 2 Age of first pregnancy 28  Social History          Socioeconomic History  . Marital status: Widowed    Spouse name: Not on file  . Number of children: Not on file  . Years of education: Not on file  . Highest education level: Not on file  Occupational History  . Not on file  Tobacco Use  . Smoking status: Current Every Day Smoker    Packs/day: 1.00    Types: Cigarettes  . Smokeless tobacco: Never Used  Vaping Use  . Vaping Use: Never used  Substance and Sexual Activity  . Alcohol use: Yes    Alcohol/week: 7.0 standard drinks    Types: 7 Cans of beer per week  . Drug use: Never  . Sexual activity: Not Currently  Other Topics Concern  . Not on file  Social History Narrative  . Not on file   Social Determinants of Health   Financial Resource Strain: Not on file  Food Insecurity: Not on file  Transportation Needs: Not on file            Allergies  Allergen Reactions  . Topiramate Anxiety and Nausea  . Meloxicam Other (See Comments)  . Lyrica [Pregabalin] Nausea  . Zoloft  [Sertraline] Nausea  . Bupropion Nausea    Light headed Light headed  . Bupropion Hcl Nausea    Light headed    Current Medications        Current Outpatient Medications  Medication Sig Dispense Refill  . acetaminophen (TYLENOL) 650 MG ER tablet Take 1,300 mg by mouth once daily    . albuterol 90 mcg/actuation inhaler Inhale 2 inhalations into the lungs every 6 (six) hours as needed    . aspirin 81 MG EC tablet Take 81 mg by mouth once daily.      Marland Kitchen b complex vitamins tablet Take by mouth Take 1 tablet by mouth daily.    . calcitRIOL (ROCALTROL) 0.25 MCG capsule Take by mouth Take 0.5 mcg by mouth 2 (two) times daily.    . cholecalciferol (VITAMIN D3) 1,000 unit tablet Take 1,000 Units by mouth once daily    . clonazePAM (KLONOPIN) 0.5 MG tablet Take 0.25 mg by mouth 2 (two) times daily as needed       . clopidogreL (PLAVIX) 75 mg tablet Take 75 mg by mouth once daily       . cyanocobalamin (VITAMIN B12) 1000 MCG tablet Take 1,000 mcg by mouth once daily.    . diphenhydrAMINE (BENADRYL) 25 mg capsule Take 50 mg by mouth nightly       . isosorbide mononitrate (IMDUR) 30 MG ER tablet TAKE 1 TABLET BY MOUTH EVERY DAY 90 tablet 3  . levothyroxine (SYNTHROID, LEVOTHROID) 125 MCG tablet Take by mouth Take 125 mcg by mouth daily before breakfast.    . melatonin 10 mg Cap Take 10 mg by mouth nightly    . metoprolol tartrate (LOPRESSOR) 25 MG tablet Take 25 mg by mouth 2 (two) times daily    . nitroGLYcerin (NITROSTAT) 0.4 MG SL tablet Place 1 tablet (0.4 mg total) under the tongue every 5 (five) minutes as needed for Chest pain MAY TAKE UP TO 3 DOSES 25 tablet 2  . pantoprazole (PROTONIX) 40 MG DR tablet TAKE 1 TABLET BY MOUTH DAILY 30 tablet 10  . rosuvastatin (CRESTOR) 40 MG tablet Take 40 mg by mouth once daily       . tiotropium (SPIRIVA) 18 mcg inhalation capsule Place 18 mcg into inhaler and  inhale once daily.       No current  facility-administered medications for this visit.           Family History  Problem Relation Age of Onset  . Coronary Artery Disease (Blocked arteries around heart) Mother   . Myocardial Infarction (Heart attack) Mother   . Mental illness Mother   . Osteoporosis (Thinning of bones) Mother   . Rheum arthritis Mother   . Heart failure Mother   . Coronary Artery Disease (Blocked arteries around heart) Father   . Myocardial Infarction (Heart attack) Father   . Breast cancer Maternal Aunt        Review of Systems  Constitutional: Negative for chills and fever.  Respiratory: Negative.  Negative for cough.   Cardiovascular: Negative.   Gastrointestinal: Positive for blood in stool. Negative for abdominal pain.   The patient reports no change in her baseline dyspnea on exertion.  No chest pain.    Objective:   Physical Exam Exam conducted with a chaperone present.  Constitutional:      Appearance: Normal appearance.  Cardiovascular:     Rate and Rhythm: Normal rate and regular rhythm.     Pulses: Normal pulses.     Heart sounds: Normal heart sounds.  Pulmonary:     Effort: Pulmonary effort is normal.     Breath sounds: Normal breath sounds.  Genitourinary:    Rectum: Guaiac result positive. Anal fissure present. No mass.     Comments: Small posterior fissure identified during brief speculum exam.  No active bleeding.  Stool was black.  Hemoccult positive. Neurological:     Mental Status: She is alert and oriented to person, place, and time.  Psychiatric:        Mood and Affect: Mood normal.        Behavior: Behavior normal.    Labs and Radiology:   Laboratory studies obtained from her PCP office dated December 21, 2020 showed a hemoglobin of 12.8 with an MCV of 97, white blood cell count of 7600. Essentially normal comprehensive metabolic panel.                        Component Ref Range & Units 11:59  (12/24/20) 2 yr ago   (10/03/18) 2 yr ago  (03/22/18) 3 yr ago  (06/06/17) 3 yr ago  (01/05/17) 5 yr ago  (10/14/15)               Hemoglobin 12.0 - 15.0 gm/dL 11.8Low  9.7Low  10.6Low  9.8Low  9.8Low  11.4Low    Stool: Hemoccult positive.  Upper endoscopy dated November 24, 2015 completed by Ruby Cola, MD showed gastritis. STOMACH, ANTRUM; COLD BIOPSY:  - ANTRAL MUCOSA WITH MILD REACTIVE GASTROPATHY, FOCAL INTESTINAL  METAPLASIA AND MILD ATROPHY.  - NEGATIVE FOR H. PYLORI, DYSPLASIA AND MALIGNANCY.   B. STOMACH, BODY; COLD BIOPSY:  - INTESTINALIZED MUCOSA WITH MILD ACTIVITY IN A BACKGROUND OF MODERATE  CHRONIC INFLAMMATION, SEE NOTE.  - NEGATIVE FOR MALIGNANCY.   The patient had a adenosine stress test earlier this month.  Inconclusive.  Echo pending.    Assessment:     New onset black stools, heme positive.  Hemoglobin fall 1 g over the last 3 days.  Bright red rectal bleeding likely secondary to anorectal source.  Incomplete anoscopy.     Plan:     Patient is presently on a PPI daily.  This should be continued  She is presently on aspirin  and Plavix for her diffuse peripheral vascular disease and cardiac disease.  She has an appointment scheduled tomorrow with her PCP.  Decision can be made at that time whether she should stop her anticoagulation therapy.   Follow up will be based upon further evaluation however new onset heme positive stools and cardiac evaluation.  Anoscopy is again incomplete although it did allow confirmation of the patient's report of black stools.  She experiences severe anxiety with a anoscopy and digital rectal exam, and pending formal approval by cardiology, would defer to exam under anesthesia to assess the abnormal PET findings.      Entered by Ledell Noss, CMA, acting as a scribe for Dr. Hervey Ard, MD.   The documentation recorded by the scribe accurately reflects the service I personally performed and the  decisions made by me.   Robert Bellow, MD FACS

## 2021-02-05 ENCOUNTER — Ambulatory Visit
Admission: RE | Admit: 2021-02-05 | Discharge: 2021-02-05 | Disposition: A | Discharge status: Home / Self Care | Payer: Medicare Other | Source: Ambulatory Visit | Attending: Nurse Practitioner | Admitting: Nurse Practitioner

## 2021-02-05 ENCOUNTER — Other Ambulatory Visit: Payer: Self-pay

## 2021-02-05 DIAGNOSIS — Z1231 Encounter for screening mammogram for malignant neoplasm of breast: Secondary | ICD-10-CM | POA: Diagnosis present

## 2021-02-09 ENCOUNTER — Other Ambulatory Visit: Payer: Self-pay

## 2021-02-09 ENCOUNTER — Other Ambulatory Visit
Admission: RE | Admit: 2021-02-09 | Discharge: 2021-02-09 | Disposition: A | Discharge status: Home / Self Care | Payer: Medicare Other | Source: Ambulatory Visit | Attending: General Surgery | Admitting: General Surgery

## 2021-02-09 HISTORY — DX: Dyspnea, unspecified: R06.00

## 2021-02-09 NOTE — Patient Instructions (Addendum)
Your procedure is scheduled on: 02/19/21 - Friday Report to the Registration Desk on the 1st floor of the [facility]. To find out your arrival time, please call [phone] between 1PM - 3PM on: 02/18/21 - Thursday - Report to [place] for Labs and Covid test - 830am  REMEMBER: Instructions that are not followed completely may result in serious medical risk, up to and including death; or upon the discretion of your surgeon and anesthesiologist your surgery may need to be rescheduled.  Do not eat food after midnight the night before surgery.  No gum chewing, lozengers or hard candies.  You may however, drink CLEAR liquids up to 2 hours before you are scheduled to arrive for your surgery. Do not drink anything within 2 hours of your scheduled arrival time.  Clear liquids include: - water  - apple juice without pulp - gatorade (not RED, PURPLE, OR BLUE) - black coffee or tea (Do NOT add milk or creamers to the coffee or tea) Do NOT drink anything that is not on this list.  Type 1 and Type 2 diabetics should only drink water.  TAKE THESE MEDICATIONS THE MORNING OF SURGERY WITH A SIP OF WATER:  - clonazePAM (KLONOPIN) 0.5 MG tablet - isosorbide mononitrate (IMDUR) 30 MG 24 hr tablet - levothyroxine (SYNTHROID, LEVOTHROID) 125 MCG tablet - metoprolol tartrate (LOPRESSOR) 25 MG tablet - pantoprazole (PROTONIX) 40 MG tablet, take one the night before and one on the morning of surgery - helps to prevent nausea after surgery. - tiotropium (SPIRIVA) 18 MCG inhalation capsule  Use inhaler albuterol (VENTOLIN HFA) 108 (90 Base) MCG/ACT inhaler on the day of surgery and bring to the hospital.  Follow recommendations from Cardiologist, Pulmonologist or PCP regarding stopping Aspirin, Coumadin, Plavix, Eliquis, Pradaxa, or Pletal. - Stop taking Plavix beginning 02/13/21, Stop taking Aspirin 02/14/21.   One week prior to surgery: Stop taking beginning 02/11/21 : Stop Anti-inflammatories  (NSAIDS) such as Advil, Aleve, Ibuprofen, Motrin, Naproxen, Naprosyn and Aspirin based products such as Excedrin, Goodys Powder, BC Powder.  Stop taking beginning 02/11/21, ANY OVER THE COUNTER supplements until after surgery.Melatonin 10 MG TABS  No Alcohol for 24 hours before or after surgery.  No Smoking including e-cigarettes for 24 hours prior to surgery.  No chewable tobacco products for at least 6 hours prior to surgery.  No nicotine patches on the day of surgery.  Do not use any "recreational" drugs for at least a week prior to your surgery.  Please be advised that the combination of cocaine and anesthesia may have negative outcomes, up to and including death. If you test positive for cocaine, your surgery will be cancelled.  On the morning of surgery brush your teeth with toothpaste and water, you may rinse your mouth with mouthwash if you wish. Do not swallow any toothpaste or mouthwash.  Do not wear jewelry, make-up, hairpins, clips or nail polish.  Do not wear lotions, powders, or perfumes.   Do not shave body from the neck down 48 hours prior to surgery just in case you cut yourself which could leave a site for infection.  Also, freshly shaved skin may become irritated if using the CHG soap.  Contact lenses, hearing aids and dentures may not be worn into surgery.  Do not bring valuables to the hospital. [facility] is not responsible for any missing/lost belongings or valuables.   Notify your doctor if there is any change in your medical condition (cold, fever, infection).  Wear comfortable clothing (  specific to your surgery type) to the hospital.  Plan for stool softeners for home use; pain medications have a tendency to cause constipation. You can also help prevent constipation by eating foods high in fiber such as fruits and vegetables and drinking plenty of fluids as your diet allows.  After surgery, you can help prevent lung complications by doing breathing  exercises.  Take deep breaths and cough every 1-2 hours. Your doctor may order a device called an Incentive Spirometer to help you take deep breaths. When coughing or sneezing, hold a pillow firmly against your incision with both hands. This is called "splinting." Doing this helps protect your incision. It also decreases belly discomfort.  If you are being admitted to the hospital overnight, leave your suitcase in the car. After surgery it may be brought to your room.  If you are being discharged the day of surgery, you will not be allowed to drive home. You will need a responsible adult (18 years or older) to drive you home and stay with you that night.   If you are taking public transportation, you will need to have a responsible adult (18 years or older) with you. Please confirm with your physician that it is acceptable to use public transportation.   Please call the [facility]. at [phone] if you have any questions about these instructions.  Surgery Visitation Policy:  Patients undergoing a surgery or procedure may have one family member or support person with them as long as that person is not COVID-19 positive or experiencing its symptoms.  That person may remain in the waiting area during the procedure.  Inpatient Visitation:    Visiting hours are 7 a.m. to 8 p.m. Inpatients will be allowed two visitors daily. The visitors may change each day during the patient's stay. No visitors under the age of 40. Any visitor under the age of 51 must be accompanied by an adult. The visitor must pass COVID-19 screenings, use hand sanitizer when entering and exiting the patient's room and wear a mask at all times, including in the patient's room. Patients must also wear a mask when staff or their visitor are in the room. Masking is required regardless of vaccination status.

## 2021-02-16 NOTE — Progress Notes (Signed)
Perioperative Services Pre-Admission/Anesthesia Testing    Date: 02/17/21  Name: Maria Warren MRN:   [id number]  Re: Presurgical review and clearance   Case: [id number] Date/Time: 02/19/21 1130   Procedure: EXAM UNDER ANESTHESIA (N/A )   Anesthesia type: General   Pre-op diagnosis: abnormal PET scan   Location: [facility] OR ROOM 03 / [facility] FOR ANESTHESIA GROUP   Surgeons: Robert Bellow, MD    Patient is scheduled for the above procedure on 02/19/2021 with Dr. Loa Socks.  Of note, patient previously reviewed by PAT APP on 11/23/2020; see notes.  At that time, it was determined that patient needed further clearances from both cardiology and vascular surgery prior to proceeding with the planned surgical intervention.  Interval history reviewed as follows:   Patient was seen by vascular surgery Lucky Cowboy, MD) on 12/18/2020.  At that time, patient was doing well overall.  Carotid duplex performed that day revealed a 40-59% right ICA stenosis and a known left carotid occlusion.  Vascular surgeon noted that there is currently no indication for surgery at this point and advised that plans were to repeat Doppler study in 6 months.  In the interim, patient to continue medical management of carotid artery disease. Per vascular surgery, "this patient is optimized for surgery and may proceed with the planned procedural course with a LOW risk stratification".    Patient was seen by cardiology on 01/11/2021; notes reviewed.  At the time of her clinic visit, patient noted to appear stable from a cardiovascular perspective.  She denied any chest pain, PND, orthopnea, palpitations, symptoms, or/syncope.  Patient with exertional dyspnea related to her underlying COPD diagnosis.  Myocardial perfusion imaging study performed on 12/15/2020 revealed no evidence of stress-induced myocardial ischemia or arrhythmia.  Subsequent TTE performed on 01/07/2021 revealed normal left ventricular systolic function with  trivial valvular regurgitation; LVEF >55%.  Patient was on GDMT for her HTN and HLD diagnoses.  Blood pressure reasonably controlled at 130/90 on currently prescribed nitrate therapy.  Patient was on a statin for HLD.  Patient on daily DAPT therapy for known ASCVD and carotid artery disease; compliant with therapy. Functional capacity, as defined by DASI, is documented as being >/= 4 METS.  No changes were made to her medication regimen.  Patient to follow-up with outpatient cardiology in 6 months. Per cardiology, "this appears to be relatively low risk procedure.  Patient does not exhibit any unstable/severe angina, no recent MI, no evidence of heart failure, no high-grade AV block, no symptomatic arrhythmias, and no severe valvular insufficiencies.  Most recent myocardial perfusion study and echocardiogram are reassuring. She appears to be optimized from medication standpoint and is at LOW risk for cardiovascular events".    Again, this patient is on daily DAPT therapy.  She has been instructed on recommendations from her cardiologist and vascular surgeon for managing these medications during the perioperative period.  Patient to hold her clopidogrel for 5 days prior to her procedure (last dose 02/13/2021) and her daily low-dose ASA for 4 days prior to her procedure (last dose 02/14/2021).   ASSESSMENT:   LABS: Reviewed as acceptable for proceeding with planned surgical intervention  Hospital Outpatient Visit on 02/17/2021  Component Date Value Ref Range Status  . WBC 02/17/2021 8.8  4.0 - 10.5 K/uL Final  . RBC 02/17/2021 3.33* 3.87 - 5.11 MIL/uL Final  . Hemoglobin 02/17/2021 11.2* 12.0 - 15.0 g/dL Final  . HCT 02/17/2021 33.4* 36.0 - 46.0 % Final  . MCV 02/17/2021 100.3*  80.0 - 100.0 fL Final  . MCH 02/17/2021 33.6  26.0 - 34.0 pg Final  . MCHC 02/17/2021 33.5  30.0 - 36.0 g/dL Final  . RDW 02/17/2021 12.3  11.5 - 15.5 % Final  . Platelets 02/17/2021 234  150 - 400 K/uL Final  . nRBC  02/17/2021 0.0  0.0 - 0.2 % Final   Performed at [facility], [address] [place], [place] [address]  . Sodium 02/17/2021 139  135 - 145 mmol/L Final  . Potassium 02/17/2021 3.8  3.5 - 5.1 mmol/L Final  . Chloride 02/17/2021 103  98 - 111 mmol/L Final  . CO2 02/17/2021 28  22 - 32 mmol/L Final  . Glucose, Bld 02/17/2021 109* 70 - 99 mg/dL Final   Glucose reference range applies only to samples taken after fasting for at least 8 hours.  . BUN 02/17/2021 13  8 - 23 mg/dL Final  . Creatinine, Ser 02/17/2021 0.96  0.44 - 1.00 mg/dL Final  . Calcium 02/17/2021 8.6* 8.9 - 10.3 mg/dL Final  . GFR, Estimated 02/17/2021 >60  >60 mL/min Final   Comment: (NOTE) Calculated using the CKD-EPI Creatinine Equation (2021)   . Anion gap 02/17/2021 8  5 - 15 Final   Performed at [facility], [address] [place],  [address]   EKG: Date: 12/08/2020 Rate: 71 bpm Rhythm: normal sinus rhythm Intervals: PR 168 ms. QRS 88 ms. QTc 469 ms.  ST segment and T wave changes: Anterior ST and T wave abnormalities.  Comparison: Similar to previous tracing obtained on 12/26/2019 NOTE: Tracing obtained at [facility]; unable for review. Above based on cardiologist's interpretation.   Impression and Plan:  Maria Warren has been referred for pre-anesthesia review and clearance prior to her undergoing the planned anesthetic and procedural courses. Available labs, pertinent testing, and imaging results were personally reviewed by me. This patient has been appropriately cleared by cardiology with an overall LOW risk of significant perioperative cardiovascular complications.  Based on clinical review performed today (02/17/21), barring any significant acute changes in the patient's overall condition, it is anticipated that she will be able to proceed with the planned surgical intervention. Any acute changes in clinical condition may necessitate her procedure being postponed and/or  cancelled. Patient will meet with anesthesia team (MD and/or CRNA) on this day of her procedure for preoperative evaluation/assessment.   Pre-surgical instructions were reviewed with the patient during her PAT appointment and questions were fielded by PAT clinical staff. Patient was advised that if any questions or concerns arise prior to her procedure then she should return a call to PAT and/or her surgeon's office to discuss.  Honor Loh, MSN, APRN, FNP-C, CEN [facility]  Peri-operative Services Nurse Practitioner Phone: [phone] 02/17/21 1:56 PM  NOTE: This note has been prepared using Dragon dictation software. Despite my best ability to proofread, there is always the potential that unintentional transcriptional errors may still occur from this process.

## 2021-02-17 ENCOUNTER — Other Ambulatory Visit
Admission: RE | Admit: 2021-02-17 | Discharge: 2021-02-17 | Disposition: A | Discharge status: Home / Self Care | Payer: Medicare Other | Source: Ambulatory Visit | Attending: General Surgery | Admitting: General Surgery

## 2021-02-17 ENCOUNTER — Other Ambulatory Visit: Payer: Self-pay

## 2021-02-17 DIAGNOSIS — Z20822 Contact with and (suspected) exposure to covid-19: Secondary | ICD-10-CM | POA: Diagnosis not present

## 2021-02-17 DIAGNOSIS — Z01812 Encounter for preprocedural laboratory examination: Secondary | ICD-10-CM | POA: Insufficient documentation

## 2021-02-17 LAB — CBC
HCT: 33.4 % — ABNORMAL LOW (ref 36.0–46.0)
Hemoglobin: 11.2 g/dL — ABNORMAL LOW (ref 12.0–15.0)
MCH: 33.6 pg (ref 26.0–34.0)
MCHC: 33.5 g/dL (ref 30.0–36.0)
MCV: 100.3 fL — ABNORMAL HIGH (ref 80.0–100.0)
Platelets: 234 10*3/uL (ref 150–400)
RBC: 3.33 MIL/uL — ABNORMAL LOW (ref 3.87–5.11)
RDW: 12.3 % (ref 11.5–15.5)
WBC: 8.8 10*3/uL (ref 4.0–10.5)
nRBC: 0 % (ref 0.0–0.2)

## 2021-02-17 LAB — BASIC METABOLIC PANEL
Anion gap: 8 (ref 5–15)
BUN: 13 mg/dL (ref 8–23)
CO2: 28 mmol/L (ref 22–32)
Calcium: 8.6 mg/dL — ABNORMAL LOW (ref 8.9–10.3)
Chloride: 103 mmol/L (ref 98–111)
Creatinine, Ser: 0.96 mg/dL (ref 0.44–1.00)
GFR, Estimated: >60 mL/min (ref >60)
Glucose, Bld: 109 mg/dL — ABNORMAL HIGH (ref 70–99)
Potassium: 3.8 mmol/L (ref 3.5–5.1)
Sodium: 139 mmol/L (ref 135–145)

## 2021-02-18 LAB — SARS CORONAVIRUS 2 (TAT 6-24 HRS): SARS Coronavirus 2: NEGATIVE

## 2021-02-19 ENCOUNTER — Ambulatory Visit: Payer: Medicare Other | Admitting: Urgent Care

## 2021-02-19 ENCOUNTER — Ambulatory Visit
Admission: RE | Admit: 2021-02-19 | Discharge: 2021-02-19 | Disposition: A | Discharge status: Home / Self Care | Payer: Medicare Other | Attending: General Surgery | Admitting: General Surgery

## 2021-02-19 ENCOUNTER — Encounter: Payer: Self-pay | Admitting: General Surgery

## 2021-02-19 ENCOUNTER — Encounter
Admission: RE | Disposition: A | Discharge status: Home / Self Care | Payer: Self-pay | Source: Home / Self Care | Attending: General Surgery

## 2021-02-19 DIAGNOSIS — I2510 Atherosclerotic heart disease of native coronary artery without angina pectoris: Secondary | ICD-10-CM | POA: Insufficient documentation

## 2021-02-19 DIAGNOSIS — Z7982 Long term (current) use of aspirin: Secondary | ICD-10-CM | POA: Insufficient documentation

## 2021-02-19 DIAGNOSIS — K921 Melena: Secondary | ICD-10-CM | POA: Diagnosis not present

## 2021-02-19 DIAGNOSIS — F17210 Nicotine dependence, cigarettes, uncomplicated: Secondary | ICD-10-CM | POA: Diagnosis not present

## 2021-02-19 DIAGNOSIS — K602 Anal fissure, unspecified: Secondary | ICD-10-CM | POA: Insufficient documentation

## 2021-02-19 DIAGNOSIS — I10 Essential (primary) hypertension: Secondary | ICD-10-CM | POA: Diagnosis not present

## 2021-02-19 DIAGNOSIS — Z79890 Hormone replacement therapy (postmenopausal): Secondary | ICD-10-CM | POA: Diagnosis not present

## 2021-02-19 DIAGNOSIS — Z79899 Other long term (current) drug therapy: Secondary | ICD-10-CM | POA: Insufficient documentation

## 2021-02-19 DIAGNOSIS — Z96612 Presence of left artificial shoulder joint: Secondary | ICD-10-CM | POA: Diagnosis not present

## 2021-02-19 DIAGNOSIS — Z8521 Personal history of malignant neoplasm of larynx: Secondary | ICD-10-CM | POA: Insufficient documentation

## 2021-02-19 DIAGNOSIS — J449 Chronic obstructive pulmonary disease, unspecified: Secondary | ICD-10-CM | POA: Diagnosis not present

## 2021-02-19 DIAGNOSIS — I6523 Occlusion and stenosis of bilateral carotid arteries: Secondary | ICD-10-CM | POA: Insufficient documentation

## 2021-02-19 DIAGNOSIS — Z7902 Long term (current) use of antithrombotics/antiplatelets: Secondary | ICD-10-CM | POA: Insufficient documentation

## 2021-02-19 DIAGNOSIS — R933 Abnormal findings on diagnostic imaging of other parts of digestive tract: Secondary | ICD-10-CM | POA: Diagnosis present

## 2021-02-19 DIAGNOSIS — Z8616 Personal history of COVID-19: Secondary | ICD-10-CM | POA: Diagnosis not present

## 2021-02-19 DIAGNOSIS — E785 Hyperlipidemia, unspecified: Secondary | ICD-10-CM | POA: Insufficient documentation

## 2021-02-19 HISTORY — PX: SIGMOIDOSCOPY: SHX6686

## 2021-02-19 LAB — URINE DRUG SCREEN, QUALITATIVE (ARMC ONLY)
Amphetamines, Ur Screen: NOT DETECTED
Barbiturates, Ur Screen: NOT DETECTED
Benzodiazepine, Ur Scrn: NOT DETECTED
Cannabinoid 50 Ng, Ur ~~LOC~~: NOT DETECTED
Cocaine Metabolite,Ur ~~LOC~~: NOT DETECTED
MDMA (Ecstasy)Ur Screen: NOT DETECTED
Methadone Scn, Ur: NOT DETECTED
Opiate, Ur Screen: NOT DETECTED
Phencyclidine (PCP) Ur S: NOT DETECTED
Tricyclic, Ur Screen: NOT DETECTED

## 2021-02-19 SURGERY — EXAM UNDER ANESTHESIA
Anesthesia: General | Site: Rectum

## 2021-02-19 MED ORDER — HYDROCORTISONE ACETATE 25 MG RE SUPP: 25.0000 mg | Freq: Two times a day (BID) | RECTAL | 1 refills | Status: AC

## 2021-02-19 MED ORDER — CHLORHEXIDINE GLUCONATE 0.12 % MT SOLN
OROMUCOSAL | Status: AC
  Filled 2021-02-19: qty 15

## 2021-02-19 MED ORDER — FENTANYL CITRATE (PF) 100 MCG/2ML IJ SOLN
INTRAMUSCULAR | Status: DC | PRN
  Administered 2021-02-19 (×2): 50 ug via INTRAVENOUS

## 2021-02-19 MED ORDER — ONDANSETRON HCL 4 MG/2ML IJ SOLN: 4.0000 mg | Freq: Once | INTRAMUSCULAR | Status: DC | PRN

## 2021-02-19 MED ORDER — SUCCINYLCHOLINE CHLORIDE 20 MG/ML IJ SOLN
INTRAMUSCULAR | Status: DC | PRN
  Administered 2021-02-19: 100 mg via INTRAVENOUS

## 2021-02-19 MED ORDER — LACTATED RINGERS IV SOLN: INTRAVENOUS | Status: DC

## 2021-02-19 MED ORDER — LACTATED RINGERS IV SOLN: INTRAVENOUS | Status: DC | PRN

## 2021-02-19 MED ORDER — PROPOFOL 10 MG/ML IV BOLUS
INTRAVENOUS | Status: DC | PRN
  Administered 2021-02-19: 150 mg via INTRAVENOUS
  Administered 2021-02-19: 50 mg via INTRAVENOUS

## 2021-02-19 MED ORDER — PHENYLEPHRINE HCL (PRESSORS) 10 MG/ML IV SOLN
INTRAVENOUS | Status: DC | PRN
  Administered 2021-02-19: 100 ug via INTRAVENOUS

## 2021-02-19 MED ORDER — FENTANYL CITRATE (PF) 100 MCG/2ML IJ SOLN: 25.0000 ug | INTRAMUSCULAR | Status: DC | PRN

## 2021-02-19 MED ORDER — ONDANSETRON HCL 4 MG/2ML IJ SOLN
INTRAMUSCULAR | Status: DC | PRN
  Administered 2021-02-19: 4 mg via INTRAVENOUS

## 2021-02-19 MED ORDER — FENTANYL CITRATE (PF) 100 MCG/2ML IJ SOLN
INTRAMUSCULAR | Status: AC
  Filled 2021-02-19: qty 2

## 2021-02-19 MED ORDER — DEXAMETHASONE SODIUM PHOSPHATE 10 MG/ML IJ SOLN
INTRAMUSCULAR | Status: DC | PRN
  Administered 2021-02-19: 10 mg via INTRAVENOUS

## 2021-02-19 MED ORDER — ORAL CARE MOUTH RINSE: 15.0000 mL | Freq: Once | OROMUCOSAL | Status: AC

## 2021-02-19 MED ORDER — SODIUM CHLORIDE 0.9 % IV SOLN
INTRAVENOUS | Status: DC | PRN
  Administered 2021-02-19: 50 ug/min via INTRAVENOUS

## 2021-02-19 MED ORDER — CHLORHEXIDINE GLUCONATE 0.12 % MT SOLN
15.0000 mL | Freq: Once | OROMUCOSAL | Status: AC
  Administered 2021-02-19: 15 mL via OROMUCOSAL

## 2021-02-19 MED ORDER — LIDOCAINE HCL (CARDIAC) PF 100 MG/5ML IV SOSY
PREFILLED_SYRINGE | INTRAVENOUS | Status: DC | PRN
  Administered 2021-02-19: 80 mg via INTRAVENOUS

## 2021-02-19 MED ORDER — GLYCOPYRROLATE 0.2 MG/ML IJ SOLN
INTRAMUSCULAR | Status: DC | PRN
  Administered 2021-02-19: .2 mg via INTRAVENOUS

## 2021-02-19 SURGICAL SUPPLY — 21 items
BLADE SURG 15 STRL SS SAFETY (BLADE) ×3 IMPLANT
COVER WAND RF STERILE (DRAPES) ×3 IMPLANT
DRAPE LAPAROTOMY 100X77 ABD (DRAPES) ×3 IMPLANT
DRAPE LEGGINS SURG 28X43 STRL (DRAPES) ×3 IMPLANT
DRAPE UNDER BUTTOCK W/FLU (DRAPES) ×3 IMPLANT
DRSG GAUZE PETRO 6X36 STRIP ST (GAUZE/BANDAGES/DRESSINGS) ×3 IMPLANT
ELECT REM PT RETURN 9FT ADLT (ELECTROSURGICAL) ×3
ELECTRODE REM PT RTRN 9FT ADLT (ELECTROSURGICAL) ×2 IMPLANT
GLOVE SURG ENC MOIS LTX SZ7.5 (GLOVE) ×3 IMPLANT
GLOVE SURG UNDER LTX SZ8 (GLOVE) ×3 IMPLANT
GOWN STRL REUS W/ TWL LRG LVL3 (GOWN DISPOSABLE) ×4 IMPLANT
GOWN STRL REUS W/TWL LRG LVL3 (GOWN DISPOSABLE) ×6
LABEL OR SOLS (LABEL) ×3 IMPLANT
MANIFOLD NEPTUNE II (INSTRUMENTS) ×3 IMPLANT
PACK BASIN MINOR ARMC (MISCELLANEOUS) ×3 IMPLANT
PAD PREP 24X41 OB/GYN DISP (PERSONAL CARE ITEMS) ×3 IMPLANT
SOL PREP PVP 2OZ (MISCELLANEOUS) ×3
SOLUTION PREP PVP 2OZ (MISCELLANEOUS) ×2 IMPLANT
STRAP SAFETY 5IN WIDE (MISCELLANEOUS) ×3 IMPLANT
SURGILUBE 2OZ TUBE FLIPTOP (MISCELLANEOUS) ×3 IMPLANT
SYR 10ML LL (SYRINGE) ×3 IMPLANT

## 2021-02-19 NOTE — OR Nursing (Signed)
OK TO RESUME PLAVIX WITH OTHER MEDS PER DR. BYRNETT SECURE-CHAT, PT/FAMILY AWARE OF SAME.

## 2021-02-19 NOTE — H&P (Signed)
Maria Warren [id number] 05-25-1956     HPI:  Patient with history of blood in stools, suspected ano-rectal source. Colonoscopy in 2017 for heme positive stools by Loistine Simas, MD notable only for a tortuous colon and diverticuli.  Limited office anoscopy showed a suspected posterior fissure. For exam under anesthesia.    Medications Prior to Admission  Medication Sig Dispense Refill Last Dose  . acetaminophen (TYLENOL) 650 MG CR tablet Take 1,300 mg by mouth every 8 (eight) hours as needed for pain.   Past Week at Unknown time  . albuterol (VENTOLIN HFA) 108 (90 Base) MCG/ACT inhaler Inhale 2 puffs into the lungs every 6 (six) hours as needed for wheezing or shortness of breath.   02/19/2021 at Unknown time  . aspirin EC 81 MG tablet Take 81 mg by mouth in the morning.   02/13/2021  . calcitRIOL (ROCALTROL) 0.5 MCG capsule Take 1 mcg by mouth in the morning and at bedtime.   02/18/2021 at Unknown time  . Cholecalciferol (VITAMIN D3) 50 MCG (2000 UT) TABS Take 2,000 Units by mouth in the morning.   02/18/2021 at Unknown time  . clonazePAM (KLONOPIN) 0.5 MG tablet Take 0.25 mg by mouth 2 (two) times daily.   02/19/2021 at Unknown time  . clopidogrel (PLAVIX) 75 MG tablet TAKE 1 TABLET BY MOUTH DAILY (Patient taking differently: Take 75 mg by mouth in the morning.) 30 tablet 5 02/13/2021  . Cyanocobalamin (B-12) 2500 MCG TABS Take 2,500 mg by mouth in the morning.   02/18/2021 at Unknown time  . diphenhydrAMINE (BENADRYL) 25 mg capsule Take 50 mg by mouth at bedtime.   02/18/2021 at Unknown time  . isosorbide mononitrate (IMDUR) 30 MG 24 hr tablet Take 30 mg by mouth in the morning.   02/19/2021 at Unknown time  . levothyroxine (SYNTHROID, LEVOTHROID) 125 MCG tablet Take 125 mcg by mouth daily before breakfast.   02/19/2021 at Unknown time  . Melatonin 10 MG TABS Take 10 mg by mouth at bedtime.   Past Week at Unknown time  . metoprolol tartrate (LOPRESSOR) 25 MG tablet Take 1 tablet (25 mg total) by  mouth 2 (two) times daily for 15 days. 30 tablet 0 02/19/2021 at Unknown time  . nitroGLYCERIN (NITROSTAT) 0.4 MG SL tablet Place 0.4 mg under the tongue every 5 (five) minutes x 3 doses as needed for chest pain.     . pantoprazole (PROTONIX) 40 MG tablet Take 40 mg by mouth in the morning.   02/19/2021 at Unknown time  . rosuvastatin (CRESTOR) 40 MG tablet Take 40 mg by mouth at bedtime.   02/18/2021 at Unknown time  . tiotropium (SPIRIVA) 18 MCG inhalation capsule Place 18 mcg into inhaler and inhale daily as needed (respiratory issues).   02/19/2021 at Unknown time   Allergies  Allergen Reactions  . Topiramate Nausea Only and Anxiety  . Lyrica [Pregabalin] Nausea Only  . Zoloft [Sertraline Hcl] Nausea Only  . Bupropion Nausea Only    Light headed    Past Medical History:  Diagnosis Date  . Anemia   . Anxiety   . Arthritis    knees, lower back  . Carotid stenosis    100% blockage left carotid and 70% blockage in right  . COPD (chronic obstructive pulmonary disease) ([place])   . Coronary artery disease    4 blockages in heart  . COVID-19 07/2020  . Depression   . Dyspnea   . GERD (gastroesophageal reflux disease)   . Headache  Pain over left eye, then vision in left eye goes black, quickly resolves.  Said Dr. Melony Overly office aware.  [place] Heart murmur   . Hypertension   . Hypothyroidism   . 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 Lower extremity edema   . Thyroid disease   . Wears dentures    full upper   Past Surgical History:  Procedure Laterality Date  . CARDIAC CATHETERIZATION    . CATARACT EXTRACTION W/PHACO Right 03/07/2017   Procedure: CATARACT EXTRACTION PHACO AND INTRAOCULAR LENS PLACEMENT ([place])  Right;  Surgeon: Eulogio Bear, MD;  Location: [facility];  Service: Ophthalmology;  Laterality: Right;  . CATARACT EXTRACTION W/PHACO Left 04/11/2017   Procedure: CATARACT EXTRACTION PHACO AND INTRAOCULAR LENS  PLACEMENT ([place])  left;  Surgeon: Eulogio Bear, MD;  Location: [facility];  Service: Ophthalmology;  Laterality: Left;  . CESAREAN SECTION     x 2  . COLONOSCOPY N/A 11/24/2015   Procedure: COLONOSCOPY;  Surgeon: Lollie Sails, MD;  Location: [facility] ENDOSCOPY;  Service: Endoscopy;  Laterality: N/A;  . ESOPHAGOGASTRODUODENOSCOPY (EGD) WITH PROPOFOL N/A 11/24/2015   Procedure: ESOPHAGOGASTRODUODENOSCOPY (EGD) WITH PROPOFOL;  Surgeon: Lollie Sails, MD;  Location: [facility] ENDOSCOPY;  Service: Endoscopy;  Laterality: N/A;  . EYE SURGERY    . JOINT REPLACEMENT    . LEFT HEART CATH AND CORONARY ANGIOGRAPHY Left 01/11/2017   Procedure: Left Heart Cath and Coronary Angiography;  Surgeon: Corey Skains, MD;  Location: [facility] CV LAB;  Service: Cardiovascular;  Laterality: Left;  [place] MICROLARYNGOSCOPY Left 01/14/2020   Procedure: Suspension Microlaryngoscopy with biopsy;  Surgeon: Carloyn Manner, MD;  Location: [facility] ORS;  Service: ENT;  Laterality: Left;  . REVERSE SHOULDER ARTHROPLASTY Left 04/26/2018   Procedure: REVERSE SHOULDER ARTHROPLASTY;  Surgeon: Corky Mull, MD;  Location: [facility] ORS;  Service: Orthopedics;  Laterality: Left;  . TOTAL KNEE ARTHROPLASTY Left 01/29/2018   Procedure: TOTAL KNEE ARTHROPLASTY;  Surgeon: Lovell Sheehan, MD;  Location: [facility] ORS;  Service: Orthopedics;  Laterality: Left;  . TOTAL THYROIDECTOMY    . TUBAL LIGATION     Social History   Socioeconomic History  . Marital status: Widowed    Spouse name: Not on file  . Number of children: Not on file  . Years of education: Not on file  . Highest education level: Not on file  Occupational History  . Occupation: disabled   Tobacco Use  . Smoking status: Current Every Day Smoker    Packs/day: 0.50    Years: 25.00    Pack years: 12.50    Types: Cigarettes  . Smokeless tobacco: Never Used  Vaping Use  . Vaping Use: Former  Substance and Sexual Activity  . Alcohol use: Yes    Alcohol/week:  14.0 standard drinks    Types: 14 Cans of beer per week    Comment: 3-4 beers daily  . Drug use: Not Currently    Types: Cocaine, Marijuana, "Crack" cocaine    Comment: drug use in 1980s-crack in 2018 but stopped  . Sexual activity: Not on file  Other Topics Concern  . Not on file  Social History Narrative  . Not on file   Social Determinants of Health   Financial Resource Strain: Not on file  Food Insecurity: Not on file  Transportation Needs: Not on file  Physical Activity: Not on file  Stress: Not on file  Social Connections: Not on file  Intimate  Partner Violence: Not on file   Social History   Social History Narrative  . Not on file     ROS: Negative.     PE: HEENT: Negative. Lungs: Clear. Cardio: RR.    Assessment/Plan:  Proceed with planned exam under anesthesia.   Forest Gleason [facility] 02/19/2021

## 2021-02-19 NOTE — Anesthesia Preprocedure Evaluation (Signed)
Anesthesia Evaluation  Patient identified by MRN, date of birth, ID band Patient awake    Reviewed: Allergy & Precautions, NPO status , Patient's Chart, lab work & pertinent test results  History of Anesthesia Complications Negative for: history of anesthetic complications  Airway Mallampati: III  TM Distance: >3 FB Neck ROM: Full    Dental  (+) Edentulous Upper, Edentulous Lower, Dental Advidsory Given   Pulmonary neg shortness of breath, COPD, neg recent URI, Current Smoker and Patient abstained from smoking.,    breath sounds clear to auscultation- rhonchi (-) wheezing      Cardiovascular hypertension, Pt. on medications (-) angina+ CAD (medically managed)  (-) Past MI, (-) Cardiac Stents and (-) CABG (-) dysrhythmias + Valvular Problems/Murmurs  Rhythm:Regular Rate:Normal - Systolic murmurs and - Diastolic murmurs    Neuro/Psych  Headaches, neg Seizures PSYCHIATRIC DISORDERS Anxiety Depression    GI/Hepatic Neg liver ROS, GERD  ,  Endo/Other  neg diabetesHypothyroidism   Renal/GU negative Renal ROS     Musculoskeletal  (+) Arthritis ,   Abdominal (+) + obese,   Peds  Hematology negative hematology ROS (+)   Anesthesia Other Findings Past Medical History: No date: Anxiety No date: Arthritis     Comment:  knees, lower back No date: Carotid stenosis No date: COPD (chronic obstructive pulmonary disease) ([facility]) No date: Coronary artery disease No date: Depression No date: GERD (gastroesophageal reflux disease) No date: Headache     Comment:  Pain over left eye, then vision in left eye goes black,               quickly resolves.  Said Dr. Melony Overly office aware. No date: Hypertension No date: Hypothyroidism No date: Lower extremity edema No date: Thyroid disease No date: Wears dentures     Comment:  full upper   Reproductive/Obstetrics                             Anesthesia  Physical  Anesthesia Plan  ASA: III  Anesthesia Plan: General   Post-op Pain Management:    Induction: Intravenous  PONV Risk Score and Plan: 1 and Ondansetron, Dexamethasone and Treatment may vary due to age or medical condition  Airway Management Planned: LMA  Additional Equipment:   Intra-op Plan:   Post-operative Plan: Extubation in OR  Informed Consent: I have reviewed the patients History and Physical, chart, labs and discussed the procedure including the risks, benefits and alternatives for the proposed anesthesia with the patient or authorized representative who has indicated his/her understanding and acceptance.     Dental advisory given  Plan Discussed with: CRNA and Anesthesiologist  Anesthesia Plan Comments:         Anesthesia Quick Evaluation

## 2021-02-19 NOTE — Anesthesia Procedure Notes (Signed)
Procedure Name: Intubation Date/Time: 02/19/2021 11:56 AM Performed by: Willette Alma, CRNA Pre-anesthesia Checklist: Patient identified, Patient being monitored, Timeout performed, Emergency Drugs available and Suction available Patient Re-evaluated:Patient Re-evaluated prior to induction Oxygen Delivery Method: Circle system utilized Preoxygenation: Pre-oxygenation with 100% oxygen Induction Type: IV induction Ventilation: Mask ventilation without difficulty Laryngoscope Size: 3 and McGraph Grade View: Grade I Tube type: Oral Tube size: 6.5 mm Number of attempts: 1 Airway Equipment and Method: Stylet Placement Confirmation: ETT inserted through vocal cords under direct vision,  positive ETCO2 and breath sounds checked- equal and bilateral Secured at: 21 cm Tube secured with: Tape Dental Injury: Teeth and Oropharynx as per pre-operative assessment

## 2021-02-19 NOTE — OR Nursing (Signed)
OK TO D/C TO HOME WITHOUT MD VISIT TO POSTOP PER DR. BYRNETT VERBAL TO S Dustee [place],RN.

## 2021-02-19 NOTE — Op Note (Signed)
Preoperative diagnosis: Perianal activity based on PET scan of September 2021.  Postoperative diagnosis: Same, anal fissure, anterior and posterior.  Operative procedure: Rigid sigmoidoscopy, anoscopy.  Operating surgeon: Hervey Ard, MD.  Anesthesia: General.  Estimated blood loss: None.  This 65 year old woman had abnormal imaging obtained to follow-up of previously treated head neck cancer.  It was impossible to complete an adequate anoscopic exam in the office.  She is brought the operating for exam under anesthesia.  Operative note: The patient underwent general anesthesia.  She was placed in dorsolithotomy position.  The perineum was cleansed with Betadine solution and draped.  The anus was noted to be snug on digital exam.  No masses were appreciated.  The rigid sigmoidoscope could be advanced about 10 cm at which point formed stool precluded further passage.  No abnormality of the visualized lower rectal mucosa.  The smaller anoscope was placed and fissures both at the anterior 1 o'clock position in the posterior 6 o'clock position were noted.  Active bleeding with the former.  No perianal masses noted.  The anus could be dilated to 2 cm with the thumb.  The patient tolerated the procedure well and was taken to the PACU in stable condition.

## 2021-02-19 NOTE — Anesthesia Postprocedure Evaluation (Signed)
Anesthesia Post Note  Patient: Maria Warren  Procedure(s) Performed: [name] UNDER ANESTHESIA (N/A Rectum) SIGMOIDOSCOPY (N/A )  Patient location during evaluation: PACU Anesthesia Type: General Level of consciousness: awake and alert Pain management: pain level controlled Vital Signs Assessment: post-procedure vital signs reviewed and stable Respiratory status: spontaneous breathing, nonlabored ventilation, respiratory function stable and patient connected to nasal cannula oxygen Cardiovascular status: blood pressure returned to baseline and stable Postop Assessment: no apparent nausea or vomiting Anesthetic complications: no   No complications documented.   Last Vitals:  Vitals:   02/19/21 1300 02/19/21 1323  BP: 99/84 117/64  Pulse: 71 65  Resp: 18 16  Temp: 36.7 C 36.7 C  SpO2: 95% 97%    Last Pain:  Vitals:   02/19/21 1323  TempSrc: Temporal  PainSc: 0-No pain                 Martha Clan

## 2021-02-19 NOTE — Discharge Instructions (Signed)

## 2021-02-19 NOTE — Transfer of Care (Signed)
Immediate Anesthesia Transfer of Care Note  Patient: Maria Warren  Procedure(s) Performed: [name] UNDER ANESTHESIA (N/A Rectum) SIGMOIDOSCOPY (N/A )  Patient Location: PACU  Anesthesia Type:General  Level of Consciousness: awake  Airway & Oxygen Therapy: Patient Spontanous Breathing and Patient connected to face mask oxygen  Post-op Assessment: Report given to RN  Post vital signs: stable  Last Vitals:  Vitals Value Taken Time  BP    Temp    Pulse 64 02/19/21 1234  Resp 20 02/19/21 1234  SpO2 96 % 02/19/21 1234  Vitals shown include unvalidated device data.  Last Pain:  Vitals:   02/19/21 0956  TempSrc: Oral  PainSc: 0-No pain         Complications: No complications documented.

## 2021-02-20 ENCOUNTER — Encounter: Payer: Self-pay | Admitting: General Surgery

## 2021-03-01 ENCOUNTER — Telehealth (INDEPENDENT_AMBULATORY_CARE_PROVIDER_SITE_OTHER): Payer: Self-pay | Admitting: Vascular Surgery

## 2021-03-01 NOTE — Telephone Encounter (Signed)
Called stating that since she's started Plavix she's having nosebleeds and bleeding during bowel movements. Patient would like to stop Plavix or be switched to an alternative. Patient was last seen 12/18/20 with carotid studies (JD). Please advise.

## 2021-03-02 NOTE — Telephone Encounter (Signed)
I spoke with Dr Lucky Cowboy and informed that the patient can stop taking Clopidogrel. Patient will have to start taking Aspirin 325 mg one time daily for the alternative. Patient was made aware with medical recommendations and prescription for Asprin 325 mg has being faxed over to pharmacy.

## 2021-03-22 ENCOUNTER — Other Ambulatory Visit: Payer: Self-pay

## 2021-03-22 ENCOUNTER — Encounter: Payer: Self-pay | Admitting: Radiation Oncology

## 2021-03-22 ENCOUNTER — Ambulatory Visit
Admission: RE | Admit: 2021-03-22 | Discharge: 2021-03-22 | Disposition: A | Discharge status: Home / Self Care | Payer: Medicare Other | Source: Ambulatory Visit | Attending: Radiation Oncology | Admitting: Radiation Oncology

## 2021-03-22 VITALS — BP 150/77 | HR 70 | Temp 96.7°F | Resp 16 | Wt 191.7 lb

## 2021-03-22 DIAGNOSIS — Z923 Personal history of irradiation: Secondary | ICD-10-CM | POA: Insufficient documentation

## 2021-03-22 DIAGNOSIS — Z8521 Personal history of malignant neoplasm of larynx: Secondary | ICD-10-CM | POA: Diagnosis present

## 2021-03-22 DIAGNOSIS — C760 Malignant neoplasm of head, face and neck: Secondary | ICD-10-CM

## 2021-03-22 NOTE — Progress Notes (Signed)
Radiation Oncology Follow up Note  Name: Maria Warren   Date:   03/22/2021 MRN:  [id number] DOB: 1956-04-27    This 65 y.o. 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  She specifically denies dysphagia or head and neck pain.  She was.  Seen by ENT several weeks ago had upper endoscopy showing no evidence of disease.  She also had a PET CT scan back in September which I reviewed shows again no evidence of disease.  She had mammograms last month which I have also reviewed were BI-RADS 1 negative.  COMPLICATIONS OF TREATMENT: none  FOLLOW UP COMPLIANCE: keeps appointments   PHYSICAL EXAM:  BP (!) 150/77 (BP Location: Left Arm, Patient Position: Sitting)   Pulse 70   Temp (!) 96.7 F (35.9 C)   Resp 16   Wt 191 lb 11.2 oz (87 kg)   SpO2 (!) 70%   BMI 33.96 kg/m  No evidence of cervical or supraclavicular adenopathy.  Well-developed well-nourished patient in NAD. HEENT reveals PERLA, EOMI, discs not visualized.  Oral cavity is clear. No oral mucosal lesions are identified. Neck is clear without evidence of cervical or supraclavicular adenopathy. Lungs are clear to A&P. Cardiac examination is essentially unremarkable with regular rate and rhythm without murmur rub or thrill. Abdomen is benign with no organomegaly or masses noted. Motor sensory and DTR levels are equal and symmetric in the upper and lower extremities. Cranial nerves II through XII are grossly intact. Proprioception is intact. No peripheral adenopathy or edema is identified. No motor or sensory levels are noted. Crude visual fields are within normal range.  RADIOLOGY RESULTS: PET scan as well as  mammograms reviewed compatible with above-stated findings  PLAN: Present time patient is now 1 year out from squamous cell carcinoma the supraglottic larynx she continues to do well with no evidence of disease or side effects.  And pleased with her overall progress.  I have asked to see her back in 1 year for follow-up.  She continues close follow-up care with ENT.  Patient is to call with any concerns.  I would like to take this opportunity to thank you for allowing me to participate in the care of your patient.Noreene Filbert, MD

## 2021-04-26 ENCOUNTER — Telehealth: Payer: Self-pay

## 2021-04-26 NOTE — Telephone Encounter (Signed)
   Maria Warren DOB: 04/12/1956 MRN: [id number]   RIDER WAIVER AND RELEASE OF LIABILITY  For purposes of improving physical access to our facilities, [facility] is pleased to partner with third parties to provide [facility] patients or other authorized individuals the option of convenient, on-demand ground transportation services (the Technical brewer") through use of the technology service that enables users to request on-demand ground transportation from independent third-party providers.  By opting to use and accept these [employer], I, the undersigned, hereby agree on behalf of myself, and on behalf of any minor child using the Government social research officer for whom I am the parent or legal guardian, as follows:  Government social research officer provided to me are provided by independent third-party transportation providers who are not [employer] or employees and who are unaffiliated with [employer]. [facility] is neither a transportation carrier nor a common or public carrier. [facility] has no control over the quality or safety of the transportation that occurs as a result of the [employer]. [facility] cannot guarantee that any third-party transportation provider will complete any arranged transportation service. [facility] makes no representation, warranty, or guarantee regarding the reliability, timeliness, quality, safety, suitability, or availability of any of the Transport Services or that they will be error free. I fully understand that traveling by vehicle involves risks and dangers of serious bodily injury, including permanent disability, paralysis, and death. I agree, on behalf of myself and on behalf of any minor child using the Transport Services for whom I am the parent or legal guardian, that the entire risk arising out of my use of the [employer] remains solely with me, to the maximum extent permitted under applicable law. The [employer] are provided "as  is" and "as available." [facility] disclaims all representations and warranties, express, implied or statutory, not expressly set out in these terms, including the implied warranties of merchantability and fitness for a particular purpose. I hereby waive and release [facility], its agents, employees, officers, directors, representatives, insurers, attorneys, assigns, successors, subsidiaries, and affiliates from any and all past, present, or future claims, demands, liabilities, actions, causes of action, or suits of any kind directly or indirectly arising from acceptance and use of the [employer]. I further waive and release [facility] and its affiliates from all present and future liability and responsibility for any injury or death to persons or damages to property caused by or related to the use of the [employer]. I have read this Waiver and Release of Liability, and I understand the terms used in it and their legal significance. This Waiver is freely and voluntarily given with the understanding that my right (as well as the right of any minor child for whom I am the parent or legal guardian using the [employer]) to legal recourse against [facility] in connection with the [employer] is knowingly surrendered in return for use of these services.   I attest that I read the consent document to Maria Warren, gave Ms. Esther the opportunity to ask questions and answered the questions asked (if any). I affirm that Maria Warren then provided consent for she's participation in this program.     Drucie Ip

## 2021-04-27 ENCOUNTER — Inpatient Hospital Stay: Payer: Medicare Other | Attending: Oncology

## 2021-04-27 ENCOUNTER — Inpatient Hospital Stay: Payer: Medicare Other | Admitting: Oncology

## 2021-06-18 ENCOUNTER — Other Ambulatory Visit: Payer: Self-pay

## 2021-06-18 ENCOUNTER — Ambulatory Visit (INDEPENDENT_AMBULATORY_CARE_PROVIDER_SITE_OTHER): Payer: Medicare Other | Admitting: Vascular Surgery

## 2021-06-18 ENCOUNTER — Ambulatory Visit (INDEPENDENT_AMBULATORY_CARE_PROVIDER_SITE_OTHER): Payer: Medicare Other

## 2021-06-18 VITALS — BP 145/82 | HR 71 | Ht 63.0 in | Wt 186.0 lb

## 2021-06-18 DIAGNOSIS — I10 Essential (primary) hypertension: Secondary | ICD-10-CM | POA: Diagnosis not present

## 2021-06-18 DIAGNOSIS — I6523 Occlusion and stenosis of bilateral carotid arteries: Secondary | ICD-10-CM

## 2021-06-18 DIAGNOSIS — I2510 Atherosclerotic heart disease of native coronary artery without angina pectoris: Secondary | ICD-10-CM

## 2021-06-18 NOTE — Assessment & Plan Note (Signed)
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  No change from previous studies.  No role for intervention at this level.  Continue aspirin and statin agent.  Recheck in 6 months.

## 2021-06-18 NOTE — Progress Notes (Signed)
MRN : [id number]  Maria Warren is a 65 y.o. (10/28/1956) female who presents with chief complaint of  Chief Complaint  Patient presents with   Follow-up    6 Mo   Carotid  .  History of Present Illness: Patient returns in follow-up of her carotid disease.  She is doing well today and does not have any specific complaints.  Denies any focal neurologic symptoms. 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  No change from previous studies  Current Outpatient Medications  Medication Sig Dispense Refill   acetaminophen (TYLENOL) 650 MG CR tablet Take 1,300 mg by mouth every 8 (eight) hours as needed for pain.     albuterol (VENTOLIN HFA) 108 (90 Base) MCG/ACT inhaler Inhale 2 puffs into the lungs every 6 (six) hours as needed for wheezing or shortness of breath.     aspirin 325 MG tablet Take 325 mg by mouth daily.     calcitRIOL (ROCALTROL) 0.5 MCG capsule Take 1 mcg by mouth in the morning and at bedtime.     Cholecalciferol (VITAMIN D3) 50 MCG (2000 UT) TABS Take 2,000 Units by mouth in the morning.     clonazePAM (KLONOPIN) 0.5 MG tablet Take 0.25 mg by mouth 2 (two) times daily.     Cyanocobalamin (B-12) 2500 MCG TABS Take 2,500 mg by mouth in the morning.     diphenhydrAMINE (BENADRYL) 25 mg capsule Take 50 mg by mouth at bedtime.     hydrocortisone (ANUSOL-HC) 25 MG suppository Place 1 suppository (25 mg total) rectally every 12 (twelve) hours. Use 1 suppository twice a day for the first 6 days, then daily for an additional 12 days. 24 suppository 1   isosorbide mononitrate (IMDUR) 30 MG 24 hr tablet Take 30 mg by mouth in the morning.     levothyroxine (SYNTHROID, LEVOTHROID) 125 MCG tablet Take 125 mcg by mouth daily before breakfast.     Melatonin 10 MG TABS Take 10 mg by mouth at  bedtime.     nitroGLYCERIN (NITROSTAT) 0.4 MG SL tablet Place 0.4 mg under the tongue every 5 (five) minutes x 3 doses as needed for chest pain.     pantoprazole (PROTONIX) 40 MG tablet Take 40 mg by mouth in the morning.     rosuvastatin (CRESTOR) 40 MG tablet Take 40 mg by mouth at bedtime.     tiotropium (SPIRIVA) 18 MCG inhalation capsule Place 18 mcg into inhaler and inhale daily as needed (respiratory issues).     levothyroxine (SYNTHROID) 137 MCG tablet levothyroxine 137 mcg tablet     metoprolol tartrate (LOPRESSOR) 25 MG tablet Take 1 tablet (25 mg total) by mouth 2 (two) times daily for 15 days. 30 tablet 0   No current facility-administered medications for this visit.    Past Medical History:  Diagnosis Date   Anemia    Anxiety    Arthritis    knees, lower back   Carotid stenosis    100% blockage left carotid and 70% blockage in right   COPD (chronic obstructive pulmonary disease) (HCC)    Coronary artery disease    4 blockages in heart   COVID-19 07/2020   Depression    Dyspnea    GERD (gastroesophageal reflux disease)    Headache    Pain over left eye, then  vision in left eye goes black, quickly resolves.  Said Dr. Melony Overly office aware.   Heart murmur    Hypertension    Hypothyroidism    Laryngeal cancer ([place]) 12/2019   T2N0 left supraglottic squamous cell carcinoma; s/p XRT   Lower extremity edema    Thyroid disease    Wears dentures    full upper    Past Surgical History:  Procedure Laterality Date   CARDIAC CATHETERIZATION     CATARACT EXTRACTION W/PHACO Right 03/07/2017   Procedure: CATARACT EXTRACTION PHACO AND INTRAOCULAR LENS PLACEMENT ([place])  Right;  Surgeon: Eulogio Bear, MD;  Location: [facility];  Service: Ophthalmology;  Laterality: Right;   CATARACT EXTRACTION W/PHACO Left 04/11/2017   Procedure: CATARACT EXTRACTION PHACO AND INTRAOCULAR LENS PLACEMENT ([facility])  left;  Surgeon: Eulogio Bear, MD;  Location: [facility];   Service: Ophthalmology;  Laterality: Left;   CESAREAN SECTION     x 2   COLONOSCOPY N/A 11/24/2015   Procedure: COLONOSCOPY;  Surgeon: Lollie Sails, MD;  Location: [facility]COPY;  Service: Endoscopy;  Laterality: N/A;   ESOPHAGOGASTRODUODENOSCOPY (EGD) WITH PROPOFOL N/A 11/24/2015   Procedure: ESOPHAGOGASTRODUODENOSCOPY (EGD) WITH PROPOFOL;  Surgeon: Lollie Sails, MD;  Location: [facility] ENDOSCOPY;  Service: Endoscopy;  Laterality: N/A;   EYE SURGERY     JOINT REPLACEMENT     LEFT HEART CATH AND CORONARY ANGIOGRAPHY Left 01/11/2017   Procedure: Left Heart Cath and Coronary Angiography;  Surgeon: Corey Skains, MD;  Location: [facility];  Service: Cardiovascular;  Laterality: Left;   MICROLARYNGOSCOPY Left 01/14/2020   Procedure: Suspension Microlaryngoscopy with biopsy;  Surgeon: Carloyn Manner, MD;  Location: [facility];  Service: ENT;  Laterality: Left;   REVERSE SHOULDER ARTHROPLASTY Left 04/26/2018   Procedure: REVERSE SHOULDER ARTHROPLASTY;  Surgeon: Corky Mull, MD;  Location: [facility];  Service: Orthopedics;  Laterality: Left;   SIGMOIDOSCOPY N/A 02/19/2021   Procedure: SIGMOIDOSCOPY;  Surgeon: Robert Bellow, MD;  Location: [facility];  Service: General;  Laterality: N/A;   TOTAL KNEE ARTHROPLASTY Left 01/29/2018   Procedure: TOTAL KNEE ARTHROPLASTY;  Surgeon: Lovell Sheehan, MD;  Location: [facility];  Service: Orthopedics;  Laterality: Left;   TOTAL THYROIDECTOMY     TUBAL LIGATION       Social History   Tobacco Use   Smoking status: Every Day    Packs/day: 0.50    Years: 25.00    Pack years: 12.50    Types: Cigarettes   Smokeless tobacco: Never  Vaping Use   Vaping Use: Former  Substance Use Topics   Alcohol use: Yes    Alcohol/week: 14.0 standard drinks    Types: 14 Cans of beer per week    Comment: 3-4 beers daily   Drug use: Not Currently    Types: Cocaine, Marijuana, "Crack" cocaine    Comment: drug use in 1980s-crack in 2018 but stopped       Family History  Problem Relation Age of Onset   Breast cancer Maternal Aunt    Heart failure Mother    Arthritis Mother    Heart attack Father      Allergies  Allergen Reactions   Topiramate Nausea Only and Anxiety   Lyrica [Pregabalin] Nausea Only   Zoloft [Sertraline Hcl] Nausea Only   Bupropion Nausea Only    Light headed     REVIEW OF SYSTEMS (Negative unless checked)   Constitutional: '[]'$ Weight loss  '[]'$ Fever  '[]'$ Chills Cardiac: '[]'$ Chest pain   '[]'$   Chest pressure   '[x]'$ Palpitations   '[]'$ Shortness of breath when laying flat   '[]'$ Shortness of breath at rest   '[x]'$ Shortness of breath with exertion. Vascular:  '[]'$ Pain in legs with walking   '[]'$ Pain in legs at rest   '[]'$ Pain in legs when laying flat   '[]'$ Claudication   '[]'$ Pain in feet when walking  '[]'$ Pain in feet at rest  '[]'$ Pain in feet when laying flat   '[]'$ History of DVT   '[]'$ Phlebitis   '[]'$ Swelling in legs   '[]'$ Varicose veins   '[]'$ Non-healing ulcers Pulmonary:   '[]'$ Uses home oxygen   '[]'$ Productive cough   '[]'$ Hemoptysis   '[]'$ Wheeze  '[x]'$ COPD   '[]'$ Asthma Neurologic:  '[]'$ Dizziness  '[]'$ Blackouts   '[]'$ Seizures   '[]'$ History of stroke   '[]'$ History of TIA  '[]'$ Aphasia   '[]'$ Temporary blindness   '[]'$ Dysphagia   '[]'$ Weakness or numbness in arms   '[]'$ Weakness or numbness in legs Musculoskeletal:  '[x]'$ Arthritis   '[]'$ Joint swelling   '[x]'$ Joint pain   '[]'$ Low back pain Hematologic:  '[]'$ Easy bruising  '[]'$ Easy bleeding   '[]'$ Hypercoagulable state   '[]'$ Anemic  '[]'$ Hepatitis Gastrointestinal:  '[]'$ Blood in stool   '[]'$ Vomiting blood  '[x]'$ Gastroesophageal reflux/heartburn   '[]'$ Abdominal pain Genitourinary:  '[]'$ Chronic kidney disease   '[]'$ Difficult urination  '[]'$ Frequent urination  '[]'$ Burning with urination   '[]'$ Hematuria Skin:  '[]'$ Rashes   '[]'$ Ulcers   '[]'$ Wounds Psychological:  '[x]'$ History of anxiety   '[x]'$  History of major depression.    Physical Examination  Vitals:   06/18/21 1046  BP: (!) 145/82  Pulse: 71  Weight: 186 lb (84.4 kg)  Height: '5\' 3"'$  (1.6 m)   Body mass index is 32.95  kg/m. Gen:  WD/WN, NAD Head: [place]/AT, No temporalis wasting. Ear/Nose/Throat: Hearing grossly intact, nares w/o erythema or drainage, trachea midline Eyes: Conjunctiva clear. Sclera non-icteric Neck: Supple.  No bruit  Pulmonary:  Good air movement, equal and clear to auscultation bilaterally.  Cardiac: RRR, No JVD Vascular:  Vessel Right Left  Radial Palpable Palpable       Musculoskeletal: M/S 5/5 throughout.  No deformity or atrophy. Mild LE edema. Neurologic: CN 2-12 intact. Sensation grossly intact in extremities.  Symmetrical.  Speech is fluent. Motor exam as listed above. Psychiatric: Judgment intact, Mood & affect appropriate for pt's clinical situation. Dermatologic: No rashes or ulcers noted.  No cellulitis or open wounds.     CBC Lab Results  Component Value Date   WBC 8.8 02/17/2021   HGB 11.2 (L) 02/17/2021   HCT 33.4 (L) 02/17/2021   MCV 100.3 (H) 02/17/2021   PLT 234 02/17/2021    BMET    Component Value Date/Time   NA 139 02/17/2021 1253   NA 140 10/08/2014 0523   K 3.8 02/17/2021 1253   K 3.5 10/08/2014 0523   CL 103 02/17/2021 1253   CL 101 10/08/2014 0523   CO2 28 02/17/2021 1253   CO2 34 (H) 10/08/2014 0523   GLUCOSE 109 (H) 02/17/2021 1253   GLUCOSE 115 (H) 10/08/2014 0523   BUN 13 02/17/2021 1253   BUN 10 10/08/2014 0523   CREATININE 0.96 02/17/2021 1253   CREATININE 1.01 10/08/2014 0523   CALCIUM 8.6 (L) 02/17/2021 1253   CALCIUM 6.5 (LL) 10/08/2014 0523   GFRNONAA >60 02/17/2021 1253   GFRNONAA 60 (L) 10/08/2014 0523   GFRNONAA >60 09/22/2013 1124   GFRAA >60 04/03/2020 1451   GFRAA >60 10/08/2014 0523   GFRAA >60 09/22/2013 1124   CrCl cannot be calculated (Patient's most recent lab result is older than  the maximum 21 days allowed.).  COAG Lab Results  Component Value Date   INR 0.9 09/07/2020   INR 0.87 04/20/2018   INR 0.93 01/17/2018    Radiology No results found.   Assessment/Plan Hypertension blood pressure control  important in reducing the progression of atherosclerotic disease. On appropriate oral medications.     Coronary artery disease Continue cardiac and antihypertensive medications as already ordered and reviewed, no changes at this time. 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  No change from previous studies.  No role for intervention at this level.  Continue aspirin and statin agent.  Recheck in 6 months.    Leotis Pain, MD  06/18/2021 11:19 AM    This note was created with Dragon medical transcription system.  Any errors from dictation are purely unintentional

## 2021-12-17 ENCOUNTER — Encounter (INDEPENDENT_AMBULATORY_CARE_PROVIDER_SITE_OTHER): Payer: Medicare Other

## 2021-12-17 ENCOUNTER — Ambulatory Visit (INDEPENDENT_AMBULATORY_CARE_PROVIDER_SITE_OTHER): Payer: Medicare Other | Admitting: Nurse Practitioner

## 2021-12-30 ENCOUNTER — Other Ambulatory Visit: Payer: Self-pay | Admitting: Nurse Practitioner

## 2021-12-30 DIAGNOSIS — Z1231 Encounter for screening mammogram for malignant neoplasm of breast: Secondary | ICD-10-CM

## 2022-01-19 ENCOUNTER — Ambulatory Visit (INDEPENDENT_AMBULATORY_CARE_PROVIDER_SITE_OTHER): Payer: Medicare Other | Admitting: Nurse Practitioner

## 2022-01-19 ENCOUNTER — Encounter (INDEPENDENT_AMBULATORY_CARE_PROVIDER_SITE_OTHER): Payer: Medicare Other

## 2022-02-16 ENCOUNTER — Ambulatory Visit (INDEPENDENT_AMBULATORY_CARE_PROVIDER_SITE_OTHER): Payer: Medicare Other | Admitting: Nurse Practitioner

## 2022-02-16 ENCOUNTER — Encounter (INDEPENDENT_AMBULATORY_CARE_PROVIDER_SITE_OTHER): Payer: Self-pay | Admitting: Nurse Practitioner

## 2022-02-16 ENCOUNTER — Ambulatory Visit (INDEPENDENT_AMBULATORY_CARE_PROVIDER_SITE_OTHER): Payer: Medicare Other

## 2022-02-16 VITALS — BP 163/84 | HR 76 | Resp 16 | Wt 195.8 lb

## 2022-02-16 DIAGNOSIS — I6523 Occlusion and stenosis of bilateral carotid arteries: Secondary | ICD-10-CM | POA: Diagnosis not present

## 2022-02-16 DIAGNOSIS — I10 Essential (primary) hypertension: Secondary | ICD-10-CM | POA: Diagnosis not present

## 2022-02-16 DIAGNOSIS — Z720 Tobacco use: Secondary | ICD-10-CM

## 2022-02-28 ENCOUNTER — Encounter (INDEPENDENT_AMBULATORY_CARE_PROVIDER_SITE_OTHER): Payer: Self-pay | Admitting: Nurse Practitioner

## 2022-02-28 NOTE — Progress Notes (Signed)
? ?Subjective:  ? ? Patient ID: Maria Warren, female    DOB: 02-16-1956, 66 y.o.   MRN: [id number] ?Chief Complaint  ?Patient presents with  ? Follow-up  ?  Ultrasound follow up  ? ? ?Patient returns in follow-up of her carotid disease.  She is doing well today and does not have any specific complaints.  Denies any focal neurologic symptoms. Specifically, the patient denies amaurosis fugax, speech or swallowing difficulties, or arm or leg weakness or numbness ?Carotid duplex today reveals stable 1 to 39% right ICA stenosis and an left carotid artery occlusion which is chronic.  No change from previous studies ? ? ?Review of Systems  ?All other systems reviewed and are negative. ? ?   ?Objective:  ? Physical Exam ?Vitals reviewed.  ?HENT:  ?   Head: Normocephalic.  ?Cardiovascular:  ?   Rate and Rhythm: Normal rate.  ?   Pulses: Normal pulses.  ?Pulmonary:  ?   Effort: Pulmonary effort is normal.  ?Skin: ?   General: Skin is warm and dry.  ?Neurological:  ?   Mental Status: She is alert and oriented to person, place, and time.  ?Psychiatric:     ?   Mood and Affect: Mood normal.     ?   Behavior: Behavior normal.     ?   Thought Content: Thought content normal.     ?   Judgment: Judgment normal.  ? ? ?BP (!) 163/84 (BP Location: Left Arm)   Pulse 76   Resp 16   Wt 195 lb 12.8 oz (88.8 kg)   BMI 34.68 kg/m?  ? ?Past Medical History:  ?Diagnosis Date  ? Anemia   ? Anxiety   ? Arthritis   ? knees, lower back  ? Carotid stenosis   ? 100% blockage left carotid and 70% blockage in right  ? COPD (chronic obstructive pulmonary disease) ([place])   ? Coronary artery disease   ? 4 blockages in heart  ? COVID-19 07/2020  ? Depression   ? Dyspnea   ? GERD (gastroesophageal reflux disease)   ? Headache   ? Pain over left eye, then vision in left eye goes black, quickly resolves.  Said Dr. Melony Overly office aware.  ? Heart murmur   ? Hypertension   ? Hypothyroidism   ? Laryngeal cancer ([place]) 12/2019  ? T2N0 left supraglottic squamous  cell carcinoma; s/p XRT  ? Lower extremity edema   ? Thyroid disease   ? Wears dentures   ? full upper  ? ? ?Social History  ? ?Socioeconomic History  ? Marital status: Widowed  ?  Spouse name: Not on file  ? Number of children: Not on file  ? Years of education: Not on file  ? Highest education level: Not on file  ?Occupational History  ? Occupation: disabled   ?Tobacco Use  ? Smoking status: Every Day  ?  Packs/day: 0.50  ?  Years: 25.00  ?  Pack years: 12.50  ?  Types: Cigarettes  ? Smokeless tobacco: Never  ?Vaping Use  ? Vaping Use: Former  ?Substance and Sexual Activity  ? Alcohol use: Yes  ?  Alcohol/week: 14.0 standard drinks  ?  Types: 14 Cans of beer per week  ?  Comment: 3-4 beers daily  ? Drug use: Not Currently  ?  Types: Cocaine, Marijuana, "Crack" cocaine  ?  Comment: drug use in 1980s-crack in 2018 but stopped  ? Sexual activity: Not on file  ?Other  Topics Concern  ? Not on file  ?Social History Narrative  ? Not on file  ? ?Social Determinants of Health  ? ?Financial Resource Strain: Not on file  ?Food Insecurity: Not on file  ?Transportation Needs: Not on file  ?Physical Activity: Not on file  ?Stress: Not on file  ?Social Connections: Not on file  ?Intimate Partner Violence: Not on file  ? ? ?Past Surgical History:  ?Procedure Laterality Date  ? CARDIAC CATHETERIZATION    ? CATARACT EXTRACTION W/PHACO Right 03/07/2017  ? Procedure: CATARACT EXTRACTION PHACO AND INTRAOCULAR LENS PLACEMENT ([place])  Right;  Surgeon: Eulogio Bear, MD;  Location: [place];  Service: Ophthalmology;  Laterality: Right;  ? CATARACT EXTRACTION W/PHACO Left 04/11/2017  ? Procedure: CATARACT EXTRACTION PHACO AND INTRAOCULAR LENS PLACEMENT ([place])  left;  Surgeon: Eulogio Bear, MD;  Location: [place];  Service: Ophthalmology;  Laterality: Left;  ? CESAREAN SECTION    ? x 2  ? COLONOSCOPY N/A 11/24/2015  ? Procedure: COLONOSCOPY;  Surgeon: Lollie Sails, MD;  Location: [facility] ENDOSCOPY;  Service:  Endoscopy;  Laterality: N/A;  ? ESOPHAGOGASTRODUODENOSCOPY (EGD) WITH PROPOFOL N/A 11/24/2015  ? Procedure: ESOPHAGOGASTRODUODENOSCOPY (EGD) WITH PROPOFOL;  Surgeon: Lollie Sails, MD;  Location: [facility] ENDOSCOPY;  Service: Endoscopy;  Laterality: N/A;  ? EYE SURGERY    ? JOINT REPLACEMENT    ? LEFT HEART CATH AND CORONARY ANGIOGRAPHY Left 01/11/2017  ? Procedure: Left Heart Cath and Coronary Angiography;  Surgeon: Corey Skains, MD;  Location: [place] CV LAB;  Service: Cardiovascular;  Laterality: Left;  ? MICROLARYNGOSCOPY Left 01/14/2020  ? Procedure: Suspension Microlaryngoscopy with biopsy;  Surgeon: Carloyn Manner, MD;  Location: [facility];  Service: ENT;  Laterality: Left;  ? REVERSE SHOULDER ARTHROPLASTY Left 04/26/2018  ? Procedure: REVERSE SHOULDER ARTHROPLASTY;  Surgeon: Corky Mull, MD;  Location: [facility];  Service: Orthopedics;  Laterality: Left;  ? SIGMOIDOSCOPY N/A 02/19/2021  ? Procedure: SIGMOIDOSCOPY;  Surgeon: Robert Bellow, MD;  Location: [facility];  Service: General;  Laterality: N/A;  ? TOTAL KNEE ARTHROPLASTY Left 01/29/2018  ? Procedure: TOTAL KNEE ARTHROPLASTY;  Surgeon: Lovell Sheehan, MD;  Location: [facility];  Service: Orthopedics;  Laterality: Left;  ? TOTAL THYROIDECTOMY    ? TUBAL LIGATION    ? ? ?Family History  ?Problem Relation Age of Onset  ? Breast cancer Maternal Aunt   ? Heart failure Mother   ? Arthritis Mother   ? Heart attack Father   ? ? ?Allergies  ?Allergen Reactions  ? Topiramate Nausea Only and Anxiety  ? Lyrica [Pregabalin] Nausea Only  ? Zoloft [Sertraline Hcl] Nausea Only  ? Bupropion Nausea Only  ?  Light headed ?  ? ? ? ?  Latest Ref Rng & Units 02/17/2021  ? 12:53 PM 09/07/2020  ?  6:50 AM 04/24/2020  ?  1:21 PM  ?CBC  ?WBC 4.0 - 10.5 K/uL 8.8   8.8   9.2    ?Hemoglobin 12.0 - 15.0 g/dL 11.2   12.4   12.1    ?Hematocrit 36.0 - 46.0 % 33.4   36.5   36.9    ?Platelets 150 - 400 K/uL 234   237   173    ? ? ? ? ?CMP  ?   ?Component Value Date/Time  ? NA 139  02/17/2021 1253  ? NA 140 10/08/2014 0523  ? K 3.8 02/17/2021 1253  ? K 3.5 10/08/2014 0523  ? CL 103 02/17/2021  1253  ? CL 101 10/08/2014 0523  ? CO2 28 02/17/2021 1253  ? CO2 34 (H) 10/08/2014 0523  ? GLUCOSE 109 (H) 02/17/2021 1253  ? GLUCOSE 115 (H) 10/08/2014 0523  ? BUN 13 02/17/2021 1253  ? BUN 10 10/08/2014 0523  ? CREATININE 0.96 02/17/2021 1253  ? CREATININE 1.01 10/08/2014 0523  ? CALCIUM 8.6 (L) 02/17/2021 1253  ? CALCIUM 6.5 (LL) 10/08/2014 0523  ? PROT 6.0 (L) 04/03/2020 1802  ? PROT 7.5 09/22/2013 1124  ? ALBUMIN 3.4 (L) 04/03/2020 1802  ? ALBUMIN 3.2 (L) 10/06/2014 1543  ? AST 25 04/03/2020 1802  ? AST 35 09/22/2013 1124  ? ALT 31 04/03/2020 1802  ? ALT 24 09/22/2013 1124  ? ALKPHOS 39 04/03/2020 1802  ? ALKPHOS 90 09/22/2013 1124  ? BILITOT 0.5 04/03/2020 1802  ? BILITOT 0.2 09/22/2013 1124  ? GFRNONAA >60 02/17/2021 1253  ? GFRNONAA 60 (L) 10/08/2014 0523  ? GFRNONAA >60 09/22/2013 1124  ? GFRAA >60 04/03/2020 1451  ? GFRAA >60 10/08/2014 0523  ? GFRAA >60 09/22/2013 1124  ? ? ? ?No results found. ? ?   ?Assessment & Plan:  ? ?1. Bilateral carotid artery stenosis ?Recommend: ? ?Given the patient's asymptomatic subcritical stenosis no further invasive testing or surgery at this time. ? ?Duplex ultrasound shows 1 to 39% stenosis of the right ICA with occlusion of the left ICA ? ?Continue antiplatelet therapy as prescribed ?Continue management of CAD, HTN and Hyperlipidemia ?Healthy heart diet,  encouraged exercise at least 4 times per week ?Follow up in 6 months with duplex ultrasound and physical exam   ?- VAS US CAROTID ? ?2. Benign essential hypertension ?Continue antihypertensive medications as already ordered, these medications have been reviewed and there are no changes at this time.  ? ?3. Tobacco abuse ?Smoking cessation was discussed, 3-10 minutes spent on this topic specifically  ? ? ? ?Current Outpatient Medications on File Prior to Visit  ?Medication Sig Dispense Refill  ? acetaminophen  (TYLENOL) 650 MG CR tablet Take 1,300 mg by mouth every 8 (eight) hours as needed for pain.    ? albuterol (VENTOLIN HFA) 108 (90 Base) MCG/ACT inhaler Inhale 2 puffs into the lungs every 6 (six) hours as nee

## 2022-03-10 ENCOUNTER — Other Ambulatory Visit (INDEPENDENT_AMBULATORY_CARE_PROVIDER_SITE_OTHER): Payer: Self-pay | Admitting: Vascular Surgery

## 2022-03-21 ENCOUNTER — Ambulatory Visit
Admission: RE | Admit: 2022-03-21 | Discharge: 2022-03-21 | Disposition: A | Discharge status: Home / Self Care | Payer: Medicare Other | Source: Ambulatory Visit | Attending: Radiation Oncology | Admitting: Radiation Oncology

## 2022-03-21 ENCOUNTER — Ambulatory Visit: Payer: Medicare Other | Admitting: Radiation Oncology

## 2022-03-21 ENCOUNTER — Encounter: Payer: Self-pay | Admitting: Radiation Oncology

## 2022-03-21 VITALS — BP 131/74 | HR 67 | Temp 98.2°F | Wt 193.0 lb

## 2022-03-21 DIAGNOSIS — C760 Malignant neoplasm of head, face and neck: Secondary | ICD-10-CM

## 2022-03-21 DIAGNOSIS — Z923 Personal history of irradiation: Secondary | ICD-10-CM | POA: Diagnosis not present

## 2022-03-21 DIAGNOSIS — Z8521 Personal history of malignant neoplasm of larynx: Secondary | ICD-10-CM | POA: Diagnosis present

## 2022-03-21 NOTE — Progress Notes (Signed)
Radiation Oncology Follow up Note  Name: Maria Warren   Date:   03/21/2022 MRN:  [id number] DOB: 28-Oct-1956    This 66 y.o. female presents to the clinic today for 2-year follow-up status post radiation therapy to her supraglottic larynx for squamous cell carcinoma.  REFERRING PROVIDER: [facility]  HPI: Patient is a 66 year old female now out over 2 years having completed radiation therapy to her supraglottic larynx for squamous cell carcinoma.  Seen today in routine follow-up she is doing well specifically denies any dysphagia head and neck pain.  She is under routine observation by ENT performing upper laryngoscopy showing no evidence of disease.  Her vocal quality is good..  COMPLICATIONS OF TREATMENT: none  FOLLOW UP COMPLIANCE: keeps appointments   PHYSICAL EXAM:  BP 131/74   Pulse 67   Temp 98.2 F (36.8 C)   Wt 193 lb (87.5 kg)   BMI 34.19 kg/m  Neck is clear without evidence of cervical or supraclavicular adenopathy.  Well-developed well-nourished patient in NAD. HEENT reveals PERLA, EOMI, discs not visualized.  Oral cavity is clear. No oral mucosal lesions are identified. Neck is clear without evidence of cervical or supraclavicular adenopathy. Lungs are clear to A&P. Cardiac examination is essentially unremarkable with regular rate and rhythm without murmur rub or thrill. Abdomen is benign with no organomegaly or masses noted. Motor sensory and DTR levels are equal and symmetric in the upper and lower extremities. Cranial nerves II through XII are grossly intact. Proprioception is intact. No peripheral adenopathy or edema is identified. No motor or sensory levels are noted. Crude visual fields are within normal range.  RADIOLOGY RESULTS: No current films for review  PLAN: Present time patient is now at over 2 years with no evidence of disease.  I will turn follow-up care over to ENT since they can perform routine upper laryngoscopy.  I be happy to reevaluate the  patient anytime in the future should that be indicated.  Patient knows to call with any concerns.  I would like to take this opportunity to thank you for allowing me to participate in the care of your patient.Noreene Filbert, MD

## 2022-08-10 ENCOUNTER — Ambulatory Visit
Admission: RE | Admit: 2022-08-10 | Discharge: 2022-08-10 | Disposition: A | Discharge status: Home / Self Care | Payer: Medicare Other | Source: Ambulatory Visit | Attending: Nurse Practitioner | Admitting: Nurse Practitioner

## 2022-08-10 DIAGNOSIS — Z1231 Encounter for screening mammogram for malignant neoplasm of breast: Secondary | ICD-10-CM | POA: Insufficient documentation

## 2022-08-11 ENCOUNTER — Other Ambulatory Visit (INDEPENDENT_AMBULATORY_CARE_PROVIDER_SITE_OTHER): Payer: Self-pay | Admitting: Nurse Practitioner

## 2022-08-11 DIAGNOSIS — I6523 Occlusion and stenosis of bilateral carotid arteries: Secondary | ICD-10-CM

## 2022-08-16 ENCOUNTER — Ambulatory Visit (INDEPENDENT_AMBULATORY_CARE_PROVIDER_SITE_OTHER): Payer: Medicare Other | Admitting: Vascular Surgery

## 2022-08-16 ENCOUNTER — Encounter (INDEPENDENT_AMBULATORY_CARE_PROVIDER_SITE_OTHER): Payer: Self-pay | Admitting: Vascular Surgery

## 2022-08-16 ENCOUNTER — Ambulatory Visit (INDEPENDENT_AMBULATORY_CARE_PROVIDER_SITE_OTHER): Payer: Medicare Other

## 2022-08-16 VITALS — BP 144/81 | HR 66 | Resp 18 | Ht 63.0 in | Wt 188.2 lb

## 2022-08-16 DIAGNOSIS — I6523 Occlusion and stenosis of bilateral carotid arteries: Secondary | ICD-10-CM | POA: Diagnosis not present

## 2022-08-16 DIAGNOSIS — I2510 Atherosclerotic heart disease of native coronary artery without angina pectoris: Secondary | ICD-10-CM

## 2022-08-16 DIAGNOSIS — I10 Essential (primary) hypertension: Secondary | ICD-10-CM | POA: Diagnosis not present

## 2022-08-16 NOTE — Progress Notes (Signed)
MRN : [id number]  Maria Warren is a 66 y.o. (June 06, 1956) female who presents with chief complaint of No chief complaint on file. Marland Kitchen  History of Present Illness: Patient returns in follow-up of her carotid disease.  She is doing well today.  She denies any focal neurologic symptoms.  No new complaints. Specifically, the patient denies amaurosis fugax, speech or swallowing difficulties, or arm or leg weakness or numbness. Duplex today shows velocities are actually in the 1 to 39% range on the right with a left carotid artery occlusion.  This has been stable over time to slightly improved.  She remains on aspirin and a statin agent.  Current Outpatient Medications  Medication Sig Dispense Refill   acetaminophen (TYLENOL) 650 MG CR tablet Take 1,300 mg by mouth every 8 (eight) hours as needed for pain.     albuterol (VENTOLIN HFA) 108 (90 Base) MCG/ACT inhaler Inhale 2 puffs into the lungs every 6 (six) hours as needed for wheezing or shortness of breath.     aspirin 325 MG tablet Take 325 mg by mouth daily.     calcitRIOL (ROCALTROL) 0.5 MCG capsule Take 1 mcg by mouth in the morning and at bedtime.     Cholecalciferol (VITAMIN D3) 50 MCG (2000 UT) TABS Take 2,000 Units by mouth in the morning.     clonazePAM (KLONOPIN) 0.5 MG tablet Take 0.25 mg by mouth 2 (two) times daily.     Cyanocobalamin (B-12) 2500 MCG TABS Take 2,500 mg by mouth in the morning.     isosorbide mononitrate (IMDUR) 30 MG 24 hr tablet Take 30 mg by mouth in the morning.     levothyroxine (SYNTHROID, LEVOTHROID) 125 MCG tablet Take 125 mcg by mouth daily before breakfast.     lisinopril (ZESTRIL) 2.5 MG tablet Take 2.5 mg by mouth daily.     Melatonin 10 MG TABS Take 10 mg by mouth at bedtime.     metoprolol tartrate (LOPRESSOR) 25 MG tablet      nitroGLYCERIN (NITROSTAT) 0.4 MG SL tablet Place 0.4 mg under the tongue every 5 (five) minutes x 3 doses as needed for chest pain.     pantoprazole (PROTONIX) 40 MG tablet Take  40 mg by mouth in the morning.     rosuvastatin (CRESTOR) 40 MG tablet Take 40 mg by mouth at bedtime.     tiotropium (SPIRIVA) 18 MCG inhalation capsule Place 18 mcg into inhaler and inhale daily as needed (respiratory issues).     metoprolol tartrate (LOPRESSOR) 25 MG tablet Take 1 tablet (25 mg total) by mouth 2 (two) times daily for 15 days. 30 tablet 0   No current facility-administered medications for this visit.    Past Medical History:  Diagnosis Date   Anemia    Anxiety    Arthritis    knees, lower back   Carotid stenosis    100% blockage left carotid and 70% blockage in right   COPD (chronic obstructive pulmonary disease) ([facility])    Coronary artery disease    4 blockages in heart   COVID-19 07/2020   Depression    Dyspnea    GERD (gastroesophageal reflux disease)    Headache    Pain over left eye, then vision in left eye goes black, quickly resolves.  Said Dr. Melony Overly office aware.   Heart murmur    Hypertension    Hypothyroidism    Laryngeal cancer ([place]) 12/2019   T2N0 left supraglottic squamous cell carcinoma; s/p XRT  Lower extremity edema    Thyroid disease    Wears dentures    full upper    Past Surgical History:  Procedure Laterality Date   CARDIAC CATHETERIZATION     CATARACT EXTRACTION W/PHACO Right 03/07/2017   Procedure: CATARACT EXTRACTION PHACO AND INTRAOCULAR LENS PLACEMENT ([place])  Right;  Surgeon: Eulogio Bear, MD;  Location: [place];  Service: Ophthalmology;  Laterality: Right;   CATARACT EXTRACTION W/PHACO Left 04/11/2017   Procedure: CATARACT EXTRACTION PHACO AND INTRAOCULAR LENS PLACEMENT ([place])  left;  Surgeon: Eulogio Bear, MD;  Location: [place];  Service: Ophthalmology;  Laterality: Left;   CESAREAN SECTION     x 2   COLONOSCOPY N/A 11/24/2015   Procedure: COLONOSCOPY;  Surgeon: Lollie Sails, MD;  Location: [facility] ENDOSCOPY;  Service: Endoscopy;  Laterality: N/A;   ESOPHAGOGASTRODUODENOSCOPY (EGD) WITH  PROPOFOL N/A 11/24/2015   Procedure: ESOPHAGOGASTRODUODENOSCOPY (EGD) WITH PROPOFOL;  Surgeon: Lollie Sails, MD;  Location: [facility] ENDOSCOPY;  Service: Endoscopy;  Laterality: N/A;   EYE SURGERY     JOINT REPLACEMENT     LEFT HEART CATH AND CORONARY ANGIOGRAPHY Left 01/11/2017   Procedure: Left Heart Cath and Coronary Angiography;  Surgeon: Corey Skains, MD;  Location: [place] CV LAB;  Service: Cardiovascular;  Laterality: Left;   MICROLARYNGOSCOPY Left 01/14/2020   Procedure: Suspension Microlaryngoscopy with biopsy;  Surgeon: Carloyn Manner, MD;  Location: [facility];  Service: ENT;  Laterality: Left;   REVERSE SHOULDER ARTHROPLASTY Left 04/26/2018   Procedure: REVERSE SHOULDER ARTHROPLASTY;  Surgeon: Corky Mull, MD;  Location: [facility];  Service: Orthopedics;  Laterality: Left;   SIGMOIDOSCOPY N/A 02/19/2021   Procedure: SIGMOIDOSCOPY;  Surgeon: Robert Bellow, MD;  Location: [facility];  Service: General;  Laterality: N/A;   TOTAL KNEE ARTHROPLASTY Left 01/29/2018   Procedure: TOTAL KNEE ARTHROPLASTY;  Surgeon: Lovell Sheehan, MD;  Location: [facility];  Service: Orthopedics;  Laterality: Left;   TOTAL THYROIDECTOMY     TUBAL LIGATION       Social History   Tobacco Use   Smoking status: Every Day    Packs/day: 0.50    Years: 25.00    Total pack years: 12.50    Types: Cigarettes   Smokeless tobacco: Never  Vaping Use   Vaping Use: Former  Substance Use Topics   Alcohol use: Yes    Alcohol/week: 14.0 standard drinks of alcohol    Types: 14 Cans of beer per week    Comment: 3-4 beers daily   Drug use: Not Currently    Types: Cocaine, Marijuana, "Crack" cocaine    Comment: drug use in 1980s-crack in 2018 but stopped      Family History  Problem Relation Age of Onset   Breast cancer Maternal Aunt    Heart failure Mother    Arthritis Mother    Heart attack Father      Allergies  Allergen Reactions   Topiramate Nausea Only and Anxiety   Lyrica  [Pregabalin] Nausea Only   Zoloft [Sertraline Hcl] Nausea Only   Bupropion Nausea Only    Light headed     REVIEW OF SYSTEMS (Negative unless checked)   Constitutional: '[]'$ Weight loss  '[]'$ Fever  '[]'$ Chills Cardiac: '[]'$ Chest pain   '[]'$ Chest pressure   '[x]'$ Palpitations   '[]'$ Shortness of breath when laying flat   '[]'$ Shortness of breath at rest   '[x]'$ Shortness of breath with exertion. Vascular:  '[]'$ Pain in legs with walking   '[]'$ Pain in legs at rest   '[]'$   Pain in legs when laying flat   '[]'$ Claudication   '[]'$ Pain in feet when walking  '[]'$ Pain in feet at rest  '[]'$ Pain in feet when laying flat   '[]'$ History of DVT   '[]'$ Phlebitis   '[]'$ Swelling in legs   '[]'$ Varicose veins   '[]'$ Non-healing ulcers Pulmonary:   '[]'$ Uses home oxygen   '[]'$ Productive cough   '[]'$ Hemoptysis   '[]'$ Wheeze  '[x]'$ COPD   '[]'$ Asthma Neurologic:  '[]'$ Dizziness  '[]'$ Blackouts   '[]'$ Seizures   '[]'$ History of stroke   '[]'$ History of TIA  '[]'$ Aphasia   '[]'$ Temporary blindness   '[]'$ Dysphagia   '[]'$ Weakness or numbness in arms   '[]'$ Weakness or numbness in legs Musculoskeletal:  '[x]'$ Arthritis   '[]'$ Joint swelling   '[x]'$ Joint pain   '[]'$ Low back pain Hematologic:  '[]'$ Easy bruising  '[]'$ Easy bleeding   '[]'$ Hypercoagulable state   '[]'$ Anemic  '[]'$ Hepatitis Gastrointestinal:  '[]'$ Blood in stool   '[]'$ Vomiting blood  '[x]'$ Gastroesophageal reflux/heartburn   '[]'$ Abdominal pain Genitourinary:  '[]'$ Chronic kidney disease   '[]'$ Difficult urination  '[]'$ Frequent urination  '[]'$ Burning with urination   '[]'$ Hematuria Skin:  '[]'$ Rashes   '[]'$ Ulcers   '[]'$ Wounds Psychological:  '[x]'$ History of anxiety   '[x]'$  History of major depression.    Physical Examination  Vitals:   08/16/22 1017  BP: (!) 144/81  Pulse: 66  Resp: 18  Weight: 188 lb 3.2 oz (85.4 kg)  Height: '5\' 3"'$  (1.6 m)   Body mass index is 33.34 kg/m. Gen:  WD/WN, NAD. Appears older than stated age. Head: [place]/AT, No temporalis wasting. Ear/Nose/Throat: Hearing grossly intact, nares w/o erythema or drainage, trachea midline Eyes: Conjunctiva clear. Sclera non-icteric Neck:  Supple.  No bruit  Pulmonary:  Good air movement, equal and clear to auscultation bilaterally.  Cardiac: RRR, No JVD Vascular:  Vessel Right Left  Radial Palpable Palpable           Musculoskeletal: M/S 5/5 throughout.  No deformity or atrophy. No edema. Neurologic: CN 2-12 intact. Sensation grossly intact in extremities.  Symmetrical.  Speech is fluent. Motor exam as listed above. Psychiatric: Judgment intact, Mood & affect appropriate for pt's clinical situation. Dermatologic: No rashes or ulcers noted.  No cellulitis or open wounds.     CBC Lab Results  Component Value Date   WBC 8.8 02/17/2021   HGB 11.2 (L) 02/17/2021   HCT 33.4 (L) 02/17/2021   MCV 100.3 (H) 02/17/2021   PLT 234 02/17/2021    BMET    Component Value Date/Time   NA 139 02/17/2021 1253   NA 140 10/08/2014 0523   K 3.8 02/17/2021 1253   K 3.5 10/08/2014 0523   CL 103 02/17/2021 1253   CL 101 10/08/2014 0523   CO2 28 02/17/2021 1253   CO2 34 (H) 10/08/2014 0523   GLUCOSE 109 (H) 02/17/2021 1253   GLUCOSE 115 (H) 10/08/2014 0523   BUN 13 02/17/2021 1253   BUN 10 10/08/2014 0523   CREATININE 0.96 02/17/2021 1253   CREATININE 1.01 10/08/2014 0523   CALCIUM 8.6 (L) 02/17/2021 1253   CALCIUM 6.5 (LL) 10/08/2014 0523   GFRNONAA >60 02/17/2021 1253   GFRNONAA 60 (L) 10/08/2014 0523   GFRNONAA >60 09/22/2013 1124   GFRAA >60 04/03/2020 1451   GFRAA >60 10/08/2014 0523   GFRAA >60 09/22/2013 1124   CrCl cannot be calculated (Patient's most recent lab result is older than the maximum 21 days allowed.).  COAG Lab Results  Component Value Date   INR 0.9 09/07/2020   INR 0.87 04/20/2018   INR 0.93 01/17/2018  Radiology MM 3D SCREEN BREAST BILATERAL  Result Date: 08/11/2022 CLINICAL DATA:  Screening. EXAM: DIGITAL SCREENING BILATERAL MAMMOGRAM WITH TOMOSYNTHESIS AND CAD TECHNIQUE: Bilateral screening digital craniocaudal and mediolateral oblique mammograms were obtained. Bilateral screening  digital breast tomosynthesis was performed. The images were evaluated with computer-aided detection. COMPARISON:  Previous exam(s). ACR Breast Density Category b: There are scattered areas of fibroglandular density. FINDINGS: There are no findings suspicious for malignancy. IMPRESSION: No mammographic evidence of malignancy. A result letter of this screening mammogram will be mailed directly to the patient. RECOMMENDATION: Screening mammogram in one year. (Code:SM-B-01Y) BI-RADS CATEGORY  1: Negative. Electronically Signed   By: Valentino Saxon M.D.   On: 08/11/2022 08:22     Assessment/Plan Hypertension blood pressure control important in reducing the progression of atherosclerotic disease. On appropriate oral medications.     Coronary artery disease Continue cardiac and antihypertensive medications as already ordered and reviewed, no changes at this time. Continue statin as ordered and reviewed, no changes at this time Nitrates PRN for chest pain  Bilateral carotid artery stenosis Duplex today shows velocities are actually in the 1 to 39% range on the right with a left carotid artery occlusion.  This has been stable over time to slightly improved.  We will go to a once a year follow-up at this point.  Continue current medical regimen.    Leotis Pain, MD  08/16/2022 10:57 AM    This note was created with Dragon medical transcription system.  Any errors from dictation are purely unintentional

## 2022-08-16 NOTE — Assessment & Plan Note (Signed)
Duplex today shows velocities are actually in the 1 to 39% range on the right with a left carotid artery occlusion.  This has been stable over time to slightly improved.  We will go to a once a year follow-up at this point.  Continue current medical regimen.

## 2022-12-07 ENCOUNTER — Other Ambulatory Visit: Payer: Self-pay | Admitting: Nurse Practitioner

## 2022-12-23 ENCOUNTER — Other Ambulatory Visit: Payer: Self-pay

## 2022-12-24 MED ORDER — VITAMIN D3 50 MCG (2000 UT) PO TABS: 2000.0000 [IU] | ORAL_TABLET | Freq: Every morning | ORAL | 11 refills | Status: DC

## 2022-12-24 MED ORDER — CETIRIZINE HCL 10 MG PO TABS: 10.0000 mg | ORAL_TABLET | Freq: Every day | ORAL | 11 refills | Status: DC

## 2022-12-26 ENCOUNTER — Ambulatory Visit (INDEPENDENT_AMBULATORY_CARE_PROVIDER_SITE_OTHER): Payer: 59 | Admitting: Nurse Practitioner

## 2022-12-26 VITALS — BP 136/80 | HR 79 | Ht 63.0 in | Wt 186.0 lb

## 2022-12-26 DIAGNOSIS — R7303 Prediabetes: Secondary | ICD-10-CM

## 2022-12-26 DIAGNOSIS — M159 Polyosteoarthritis, unspecified: Secondary | ICD-10-CM

## 2022-12-26 DIAGNOSIS — I10 Essential (primary) hypertension: Secondary | ICD-10-CM

## 2022-12-26 DIAGNOSIS — J449 Chronic obstructive pulmonary disease, unspecified: Secondary | ICD-10-CM | POA: Diagnosis not present

## 2022-12-26 DIAGNOSIS — F17200 Nicotine dependence, unspecified, uncomplicated: Secondary | ICD-10-CM

## 2022-12-26 DIAGNOSIS — E039 Hypothyroidism, unspecified: Secondary | ICD-10-CM | POA: Diagnosis not present

## 2022-12-26 DIAGNOSIS — M150 Primary generalized (osteo)arthritis: Secondary | ICD-10-CM

## 2022-12-26 NOTE — Patient Instructions (Addendum)
1) Fasting labs today, will call pt with results 2)  Gave pt back exercise for leg cramps 3) Followup appt in 5 months, fasting labs at that appt

## 2022-12-26 NOTE — Progress Notes (Signed)
Established Patient Office Visit  Subjective:  Patient ID: Maria Warren, female    DOB: [birth year]  Age: 67 y.o. MRN: [id number]  Chief Complaint  Patient presents with   Follow-up    Follow up today.  Fasting for labs today.       Past Medical History:  Diagnosis Date   Anemia    Anxiety    Arthritis    knees, lower back   Carotid stenosis    100% blockage left carotid and 70% blockage in right   COPD (chronic obstructive pulmonary disease) (HCC)    Coronary artery disease    4 blockages in heart   COVID-19 07/2020   Depression    Dyspnea    GERD (gastroesophageal reflux disease)    Headache    Pain over left eye, then vision in left eye goes black, quickly resolves.  Said Dr. Melony Overly office aware.   Heart murmur    Hypertension    Hypothyroidism    Laryngeal cancer ([place]) 12/2019   T2N0 left supraglottic squamous cell carcinoma; s/p XRT   Lower extremity edema    Thyroid disease    Wears dentures    full upper    Social History   Socioeconomic History   Marital status: Widowed    Spouse name: Not on file   Number of children: Not on file   Years of education: Not on file   Highest education level: Not on file  Occupational History   Occupation: disabled   Tobacco Use   Smoking status: Every Day    Packs/day: 0.50    Years: 25.00    Total pack years: 12.50    Types: Cigarettes   Smokeless tobacco: Never  Vaping Use   Vaping Use: Former  Substance and Sexual Activity   Alcohol use: Yes    Alcohol/week: 14.0 standard drinks of alcohol    Types: 14 Cans of beer per week    Comment: 3-4 beers daily   Drug use: Not Currently    Types: Cocaine, Marijuana, "Crack" cocaine    Comment: drug use in 1980s-crack in 2018 but stopped   Sexual activity: Not on file  Other Topics Concern   Not on file  Social History Narrative   Not on file   Social Determinants of Health   Financial Resource Strain: Not on file  Food Insecurity: Not on file   Transportation Needs: Not on file  Physical Activity: Not on file  Stress: Not on file  Social Connections: Not on file  Intimate Partner Violence: Not on file    Family History  Problem Relation Age of Onset   Breast cancer Maternal Aunt    Heart failure Mother    Arthritis Mother    Heart attack Father     Allergies  Allergen Reactions   Topiramate Nausea Only and Anxiety   Lyrica [Pregabalin] Nausea Only   Zoloft [Sertraline Hcl] Nausea Only   Bupropion Nausea Only    Light headed     Review of Systems  Constitutional: Negative.   HENT: Negative.    Eyes: Negative.   Respiratory: Negative.    Cardiovascular: Negative.   Gastrointestinal: Negative.   Genitourinary: Negative.   Skin: Negative.   Neurological: Negative.   Endo/Heme/Allergies: Negative.   Psychiatric/Behavioral:  The patient is nervous/anxious.        Objective:   BP 136/80   Pulse 79   Ht '5\' 3"'$  (1.6 m)   Wt 186 lb (84.4 kg)  SpO2 94%   BMI 32.95 kg/m   Vitals:   12/26/22 1300  BP: 136/80  Pulse: 79  Height: '5\' 3"'$  (1.6 m)  Weight: 186 lb (84.4 kg)  SpO2: 94%  BMI (Calculated): 32.96    Physical Exam Vitals reviewed.  Constitutional:      Appearance: Normal appearance.  HENT:     Head: Normocephalic.     Nose: Nose normal.     Mouth/Throat:     Mouth: Mucous membranes are moist.  Eyes:     Pupils: Pupils are equal, round, and reactive to light.  Cardiovascular:     Rate and Rhythm: Normal rate and regular rhythm.  Pulmonary:     Effort: Pulmonary effort is normal.     Breath sounds: Normal breath sounds.  Abdominal:     General: Bowel sounds are normal.     Palpations: Abdomen is soft.  Musculoskeletal:        General: Normal range of motion.     Cervical back: Normal range of motion and neck supple.  Skin:    General: Skin is warm and dry.  Neurological:     Mental Status: She is alert and oriented to person, place, and time.  Psychiatric:        Mood and  Affect: Mood normal.        Behavior: Behavior normal.      No results found for any visits on 12/26/22.  No results found for this or any previous visit (from the past 2160 hour(s)).    Assessment & Plan:   Problem List Items Addressed This Visit       Cardiovascular and Mediastinum   Hypertension - Primary   Relevant Orders   CMP14+EGFR     Respiratory   COPD (chronic obstructive pulmonary disease) (HCC)     Endocrine   Hypothyroidism   Relevant Orders   TSH     Musculoskeletal and Integument   Osteoarthritis     Other   Tobacco use disorder   Other Visit Diagnoses     Prediabetes       Relevant Orders   Hemoglobin A1c   Lipid panel       Return in about 5 months (around 05/26/2023).   Total time spent: 30 minutes  Evern Bio, NP  12/26/2022

## 2022-12-27 LAB — CMP14+EGFR
ALT: 15 IU/L (ref 0–32)
AST: 24 IU/L (ref 0–40)
Albumin/Globulin Ratio: 2.1 (ref 1.2–2.2)
Albumin: 4.4 g/dL (ref 3.9–4.9)
Alkaline Phosphatase: 50 IU/L (ref 44–121)
BUN/Creatinine Ratio: 14 (ref 12–28)
BUN: 12 mg/dL (ref 8–27)
Bilirubin Total: 0.3 mg/dL (ref 0.0–1.2)
CO2: 24 mmol/L (ref 20–29)
Calcium: 8.9 mg/dL (ref 8.7–10.3)
Chloride: 98 mmol/L (ref 96–106)
Creatinine, Ser: 0.83 mg/dL (ref 0.57–1.00)
Globulin, Total: 2.1 g/dL (ref 1.5–4.5)
Glucose: 96 mg/dL (ref 70–99)
Potassium: 4.6 mmol/L (ref 3.5–5.2)
Sodium: 136 mmol/L (ref 134–144)
Total Protein: 6.5 g/dL (ref 6.0–8.5)
eGFR: 77 mL/min/{1.73_m2} (ref >59)

## 2022-12-27 LAB — LIPID PANEL
Chol/HDL Ratio: 2.5 ratio (ref 0.0–4.4)
Cholesterol, Total: 148 mg/dL (ref 100–199)
HDL: 60 mg/dL (ref >39)
LDL Chol Calc (NIH): 69 mg/dL (ref 0–99)
Triglycerides: 103 mg/dL (ref 0–149)
VLDL Cholesterol Cal: 19 mg/dL (ref 5–40)

## 2022-12-27 LAB — HEMOGLOBIN A1C
Est. average glucose Bld gHb Est-mCnc: 123 mg/dL
Hgb A1c MFr Bld: 5.9 % — ABNORMAL HIGH (ref 4.8–5.6)

## 2022-12-27 LAB — TSH: TSH: 0.957 u[IU]/mL (ref 0.450–4.500)

## 2023-01-05 ENCOUNTER — Other Ambulatory Visit: Payer: Self-pay

## 2023-01-06 MED ORDER — CALCITRIOL 0.5 MCG PO CAPS: 1.0000 ug | ORAL_CAPSULE | Freq: Two times a day (BID) | ORAL | 0 refills | Status: DC

## 2023-01-06 MED ORDER — LEVOTHYROXINE SODIUM 125 MCG PO TABS: 125.0000 ug | ORAL_TABLET | Freq: Every day | ORAL | 10 refills | Status: DC

## 2023-01-30 ENCOUNTER — Other Ambulatory Visit: Payer: Self-pay | Admitting: Nurse Practitioner

## 2023-02-24 ENCOUNTER — Other Ambulatory Visit: Payer: Self-pay | Admitting: Nurse Practitioner

## 2023-02-27 ENCOUNTER — Ambulatory Visit (INDEPENDENT_AMBULATORY_CARE_PROVIDER_SITE_OTHER): Payer: 59 | Admitting: Nurse Practitioner

## 2023-02-27 ENCOUNTER — Encounter: Payer: Self-pay | Admitting: Nurse Practitioner

## 2023-02-27 VITALS — BP 148/88 | HR 71 | Ht 63.0 in | Wt 193.2 lb

## 2023-02-27 DIAGNOSIS — R7303 Prediabetes: Secondary | ICD-10-CM

## 2023-02-27 DIAGNOSIS — E538 Deficiency of other specified B group vitamins: Secondary | ICD-10-CM

## 2023-02-27 DIAGNOSIS — J449 Chronic obstructive pulmonary disease, unspecified: Secondary | ICD-10-CM

## 2023-02-27 DIAGNOSIS — J301 Allergic rhinitis due to pollen: Secondary | ICD-10-CM | POA: Insufficient documentation

## 2023-02-27 DIAGNOSIS — E039 Hypothyroidism, unspecified: Secondary | ICD-10-CM

## 2023-02-27 DIAGNOSIS — L0390 Cellulitis, unspecified: Secondary | ICD-10-CM | POA: Diagnosis not present

## 2023-02-27 MED ORDER — MUPIROCIN 2 % EX OINT: 1.0000 | TOPICAL_OINTMENT | Freq: Two times a day (BID) | CUTANEOUS | 0 refills | Status: DC

## 2023-02-27 NOTE — Patient Instructions (Signed)
1) Fasting labs today 2) mupirocin oint to navel 3) Follow up appt as already scheduled

## 2023-02-27 NOTE — Progress Notes (Signed)
Established Patient Office Visit  Subjective:  Patient ID: Maria Warren, female    DOB: Aug 09, 1956  Age: 66 y.o. MRN: [id number]  Chief Complaint  Patient presents with   Acute Visit    Possible infected naval    Acute visit.  Pt c/o possible infected navel x 2 days.    No other concerns at this time.   Past Medical History:  Diagnosis Date   Anemia    Anxiety    Arthritis    knees, lower back   Carotid stenosis    100% blockage left carotid and 70% blockage in right   COPD (chronic obstructive pulmonary disease) (HCC)    Coronary artery disease    4 blockages in heart   COVID-19 07/2020   Depression    Dyspnea    GERD (gastroesophageal reflux disease)    Headache    Pain over left eye, then vision in left eye goes black, quickly resolves.  Said Dr. Elmer Bales office aware.   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; s/p XRT   Lower extremity edema    Thyroid disease    Wears dentures    full upper    Past Surgical History:  Procedure Laterality Date   CARDIAC CATHETERIZATION     CATARACT EXTRACTION W/PHACO Right 03/07/2017   Procedure: CATARACT EXTRACTION PHACO AND INTRAOCULAR LENS PLACEMENT (IOC)  Right;  Surgeon: Nevada Crane, MD;  Location: [facility];  Service: Ophthalmology;  Laterality: Right;   CATARACT EXTRACTION W/PHACO Left 04/11/2017   Procedure: CATARACT EXTRACTION PHACO AND INTRAOCULAR LENS PLACEMENT (IOC)  left;  Surgeon: Nevada Crane, MD;  Location: [facility];  Service: Ophthalmology;  Laterality: Left;   CESAREAN SECTION     x 2   COLONOSCOPY N/A 11/24/2015   Procedure: COLONOSCOPY;  Surgeon: Christena Deem, MD;  Location: [facility] ENDOSCOPY;  Service: Endoscopy;  Laterality: N/A;   ESOPHAGOGASTRODUODENOSCOPY (EGD) WITH PROPOFOL N/A 11/24/2015   Procedure: ESOPHAGOGASTRODUODENOSCOPY (EGD) WITH PROPOFOL;  Surgeon: Christena Deem,  MD;  Location: [facility] ENDOSCOPY;  Service: Endoscopy;  Laterality: N/A;   EYE SURGERY     JOINT REPLACEMENT     LEFT HEART CATH AND CORONARY ANGIOGRAPHY Left 01/11/2017   Procedure: Left Heart Cath and Coronary Angiography;  Surgeon: Lamar Blinks, MD;  Location: [facility] INVASIVE CV LAB;  Service: Cardiovascular;  Laterality: Left;   MICROLARYNGOSCOPY Left 01/14/2020   Procedure: Suspension Microlaryngoscopy with biopsy;  Surgeon: Bud Face, MD;  Location: [facility] ORS;  Service: ENT;  Laterality: Left;   REVERSE SHOULDER ARTHROPLASTY Left 04/26/2018   Procedure: REVERSE SHOULDER ARTHROPLASTY;  Surgeon: Christena Flake, MD;  Location: [facility] ORS;  Service: Orthopedics;  Laterality: Left;   SIGMOIDOSCOPY N/A 02/19/2021   Procedure: SIGMOIDOSCOPY;  Surgeon: Earline Mayotte, MD;  Location: [facility] ORS;  Service: General;  Laterality: N/A;   TOTAL KNEE ARTHROPLASTY Left 01/29/2018   Procedure: TOTAL KNEE ARTHROPLASTY;  Surgeon: Lyndle Herrlich, MD;  Location: [facility] ORS;  Service: Orthopedics;  Laterality: Left;   TOTAL THYROIDECTOMY     TUBAL LIGATION      Social History   Socioeconomic History   Marital status: Widowed    Spouse name: Not on file   Number of children: Not on file   Years of education: Not on file   Highest education level: Not on file  Occupational History   Occupation:  disabled   Tobacco Use   Smoking status: Every Day    Packs/day: 0.50    Years: 25.00    Additional pack years: 0.00    Total pack years: 12.50    Types: Cigarettes   Smokeless tobacco: Never  Vaping Use   Vaping Use: Former  Substance and Sexual Activity   Alcohol use: Yes    Alcohol/week: 14.0 standard drinks of alcohol    Types: 14 Cans of beer per week    Comment: 3-4 beers daily   Drug use: Not Currently    Types: Cocaine, Marijuana, "Crack" cocaine    Comment: drug use in 1980s-crack in 2018 but stopped   Sexual activity: Not on file  Other Topics Concern   Not on file  Social History  Narrative   Not on file   Social Determinants of Health   Financial Resource Strain: Not on file  Food Insecurity: Not on file  Transportation Needs: Not on file  Physical Activity: Not on file  Stress: Not on file  Social Connections: Not on file  Intimate Partner Violence: Not on file    Family History  Problem Relation Age of Onset   Breast cancer Maternal Aunt    Heart failure Mother    Arthritis Mother    Heart attack Father     Allergies  Allergen Reactions   Topiramate Nausea Only and Anxiety   Lyrica [Pregabalin] Nausea Only   Zoloft [Sertraline Hcl] Nausea Only   Bupropion Nausea Only    Light headed     Review of Systems  Constitutional: Negative.   HENT: Negative.    Eyes: Negative.   Respiratory: Negative.    Cardiovascular: Negative.   Gastrointestinal: Negative.   Genitourinary: Negative.   Musculoskeletal:  Positive for back pain and joint pain.  Skin:  Positive for itching.  Neurological: Negative.   Endo/Heme/Allergies: Negative.   Psychiatric/Behavioral: Negative.         Objective:   BP (!) 148/88   Pulse 71   Ht 5\' 3"  (1.6 m)   Wt 193 lb 3.2 oz (87.6 kg)   SpO2 94%   BMI 34.22 kg/m   Vitals:   02/27/23 1331  BP: (!) 148/88  Pulse: 71  Height: 5\' 3"  (1.6 m)  Weight: 193 lb 3.2 oz (87.6 kg)  SpO2: 94%  BMI (Calculated): 34.23    Physical Exam Vitals reviewed.  Constitutional:      Appearance: Normal appearance.  HENT:     Head: Normocephalic.     Nose: Nose normal.     Mouth/Throat:     Mouth: Mucous membranes are moist.  Eyes:     Pupils: Pupils are equal, round, and reactive to light.  Cardiovascular:     Rate and Rhythm: Normal rate and regular rhythm.  Pulmonary:     Effort: Pulmonary effort is normal.     Breath sounds: Normal breath sounds.  Abdominal:     General: Bowel sounds are normal.     Palpations: Abdomen is soft.  Musculoskeletal:        General: Normal range of motion.     Cervical back: Normal  range of motion and neck supple.  Skin:    Findings: Erythema present.  Neurological:     Mental Status: She is alert and oriented to person, place, and time.  Psychiatric:        Mood and Affect: Mood normal.        Behavior: Behavior normal.  No results found for any visits on 02/27/23.  Recent Results (from the past 2160 hour(s))  Hemoglobin A1c     Status: Abnormal   Collection Time: 12/26/22  1:15 PM  Result Value Ref Range   Hgb A1c MFr Bld 5.9 (H) 4.8 - 5.6 %    Comment:          Prediabetes: 5.7 - 6.4          Diabetes: >6.4          Glycemic control for adults with diabetes: <7.0    Est. average glucose Bld gHb Est-mCnc 123 mg/dL  TSH     Status: None   Collection Time: 12/26/22  1:15 PM  Result Value Ref Range   TSH 0.957 0.450 - 4.500 uIU/mL  CMP14+EGFR     Status: None   Collection Time: 12/26/22  1:15 PM  Result Value Ref Range   Glucose 96 70 - 99 mg/dL   BUN 12 8 - 27 mg/dL   Creatinine, Ser 0.45 0.57 - 1.00 mg/dL   eGFR 77 >40 JW/JXB/1.47   BUN/Creatinine Ratio 14 12 - 28   Sodium 136 134 - 144 mmol/L   Potassium 4.6 3.5 - 5.2 mmol/L   Chloride 98 96 - 106 mmol/L   CO2 24 20 - 29 mmol/L   Calcium 8.9 8.7 - 10.3 mg/dL   Total Protein 6.5 6.0 - 8.5 g/dL   Albumin 4.4 3.9 - 4.9 g/dL   Globulin, Total 2.1 1.5 - 4.5 g/dL   Albumin/Globulin Ratio 2.1 1.2 - 2.2   Bilirubin Total 0.3 0.0 - 1.2 mg/dL   Alkaline Phosphatase 50 44 - 121 IU/L   AST 24 0 - 40 IU/L   ALT 15 0 - 32 IU/L  Lipid panel     Status: None   Collection Time: 12/26/22  1:15 PM  Result Value Ref Range   Cholesterol, Total 148 100 - 199 mg/dL   Triglycerides 829 0 - 149 mg/dL   HDL 60 >56 mg/dL   VLDL Cholesterol Cal 19 5 - 40 mg/dL   LDL Chol Calc (NIH) 69 0 - 99 mg/dL   Chol/HDL Ratio 2.5 0.0 - 4.4 ratio    Comment:                                   T. Chol/HDL Ratio                                             Men  Women                               1/2 Avg.Risk  3.4    3.3                                    Avg.Risk  5.0    4.4                                2X Avg.Risk  9.6    7.1  3X Avg.Risk 23.4   11.0       Assessment & Plan:   Problem List Items Addressed This Visit       Respiratory   COPD (chronic obstructive pulmonary disease) (HCC)   Allergic rhinitis due to pollen     Endocrine   Hypothyroidism   Relevant Orders   TSH     Other   B12 deficiency   Relevant Orders   Vitamin B12   Cellulitis - Primary   Relevant Medications   mupirocin ointment (BACTROBAN) 2 %   Other Visit Diagnoses     Prediabetes       Relevant Orders   Hemoglobin A1c   CMP14+EGFR   Lipid panel       Return pt reports she has an already scheduled appt.   Total time spent: 35 minutes  Orson Eva, NP  02/27/2023

## 2023-02-28 LAB — VITAMIN B12: Vitamin B-12: 966 pg/mL (ref 232–1245)

## 2023-02-28 LAB — CMP14+EGFR
ALT: 15 IU/L (ref 0–32)
AST: 24 IU/L (ref 0–40)
Albumin/Globulin Ratio: 2.3 — ABNORMAL HIGH (ref 1.2–2.2)
Albumin: 4.5 g/dL (ref 3.9–4.9)
Alkaline Phosphatase: 50 IU/L (ref 44–121)
BUN/Creatinine Ratio: 10 — ABNORMAL LOW (ref 12–28)
BUN: 9 mg/dL (ref 8–27)
Bilirubin Total: 0.3 mg/dL (ref 0.0–1.2)
CO2: 22 mmol/L (ref 20–29)
Calcium: 8.7 mg/dL (ref 8.7–10.3)
Chloride: 98 mmol/L (ref 96–106)
Creatinine, Ser: 0.91 mg/dL (ref 0.57–1.00)
Globulin, Total: 2 g/dL (ref 1.5–4.5)
Glucose: 92 mg/dL (ref 70–99)
Potassium: 4.3 mmol/L (ref 3.5–5.2)
Sodium: 139 mmol/L (ref 134–144)
Total Protein: 6.5 g/dL (ref 6.0–8.5)
eGFR: 69 mL/min/{1.73_m2} (ref >59)

## 2023-02-28 LAB — HEMOGLOBIN A1C
Est. average glucose Bld gHb Est-mCnc: 126 mg/dL
Hgb A1c MFr Bld: 6 % — ABNORMAL HIGH (ref 4.8–5.6)

## 2023-02-28 LAB — LIPID PANEL
Chol/HDL Ratio: 2.6 ratio (ref 0.0–4.4)
Cholesterol, Total: 143 mg/dL (ref 100–199)
HDL: 56 mg/dL (ref >39)
LDL Chol Calc (NIH): 64 mg/dL (ref 0–99)
Triglycerides: 134 mg/dL (ref 0–149)
VLDL Cholesterol Cal: 23 mg/dL (ref 5–40)

## 2023-02-28 LAB — TSH: TSH: 1.29 u[IU]/mL (ref 0.450–4.500)

## 2023-03-03 ENCOUNTER — Telehealth: Payer: Self-pay | Admitting: Nurse Practitioner

## 2023-03-03 NOTE — Telephone Encounter (Signed)
Patient called in stating that she woke up this morning with no smell or taste. She is still feeling bad and thinks it may be covid. I advised her to take an at home covid test. She will call back if it is positive.

## 2023-03-06 ENCOUNTER — Telehealth: Payer: Self-pay | Admitting: Nurse Practitioner

## 2023-03-06 NOTE — Telephone Encounter (Signed)
Patient called in requesting recommendations for allergy medicine.I advised her of Chelsa's usual recommendations of Xyzal and Nasacort nasal spray (bid). Patient verbalized understanding.

## 2023-03-24 ENCOUNTER — Other Ambulatory Visit: Payer: Self-pay | Admitting: Nurse Practitioner

## 2023-03-30 ENCOUNTER — Other Ambulatory Visit: Payer: Self-pay | Admitting: Nurse Practitioner

## 2023-04-07 ENCOUNTER — Other Ambulatory Visit: Payer: Self-pay | Admitting: Nurse Practitioner

## 2023-04-24 ENCOUNTER — Telehealth: Payer: Self-pay

## 2023-04-24 NOTE — Telephone Encounter (Signed)
Patient informed and will make appt with her cardiologist

## 2023-04-24 NOTE — Telephone Encounter (Signed)
Patient called stating that her feet are swelling and she's wanting to know if she needs to get and rx for that or what all she should do to help slow the swelling

## 2023-05-10 ENCOUNTER — Telehealth: Payer: Self-pay | Admitting: Nurse Practitioner

## 2023-05-10 NOTE — Telephone Encounter (Signed)
Patient called in needing her Spiriva and albuterol inhalers sent to [facility]. Please advise.

## 2023-05-11 ENCOUNTER — Other Ambulatory Visit: Payer: Self-pay

## 2023-05-11 MED ORDER — ALBUTEROL SULFATE HFA 108 (90 BASE) MCG/ACT IN AERS: 2.0000 | INHALATION_SPRAY | Freq: Four times a day (QID) | RESPIRATORY_TRACT | 11 refills | Status: AC | PRN

## 2023-05-11 MED ORDER — TIOTROPIUM BROMIDE MONOHYDRATE 18 MCG IN CAPS: 18.0000 ug | ORAL_CAPSULE | Freq: Every day | RESPIRATORY_TRACT | 11 refills | Status: DC | PRN

## 2023-05-26 ENCOUNTER — Ambulatory Visit: Payer: 59 | Admitting: Nurse Practitioner

## 2023-06-06 ENCOUNTER — Other Ambulatory Visit: Payer: Self-pay | Admitting: Family

## 2023-06-06 ENCOUNTER — Other Ambulatory Visit: Payer: Self-pay | Admitting: Nurse Practitioner

## 2023-06-07 ENCOUNTER — Ambulatory Visit: Payer: 59 | Admitting: Nurse Practitioner

## 2023-06-07 ENCOUNTER — Other Ambulatory Visit: Payer: Self-pay | Admitting: Family

## 2023-06-12 ENCOUNTER — Ambulatory Visit: Payer: 59 | Admitting: Family

## 2023-06-15 ENCOUNTER — Other Ambulatory Visit: Payer: Self-pay

## 2023-06-15 MED ORDER — CALCITRIOL 0.5 MCG PO CAPS: 0.5000 ug | ORAL_CAPSULE | Freq: Every day | ORAL | 0 refills | Status: DC

## 2023-06-16 ENCOUNTER — Telehealth: Payer: Self-pay | Admitting: Family

## 2023-06-16 NOTE — Telephone Encounter (Signed)
Dawn at [facility] called wanting to know if you intended to change patient's calcitrol from 2 twice a day to one daily? Please advise.

## 2023-06-19 ENCOUNTER — Other Ambulatory Visit: Payer: Self-pay

## 2023-06-19 MED ORDER — CALCITRIOL 0.5 MCG PO CAPS: 1.0000 ug | ORAL_CAPSULE | Freq: Two times a day (BID) | ORAL | 0 refills | Status: DC

## 2023-06-19 NOTE — Telephone Encounter (Signed)
[facility] called needing clarification on this Rx again. Please advise.

## 2023-06-20 NOTE — Telephone Encounter (Signed)
Already taken care of

## 2023-07-28 ENCOUNTER — Other Ambulatory Visit: Payer: Self-pay | Admitting: Family

## 2023-08-14 ENCOUNTER — Other Ambulatory Visit: Payer: Self-pay | Admitting: Internal Medicine

## 2023-08-14 ENCOUNTER — Other Ambulatory Visit: Payer: Self-pay

## 2023-08-15 ENCOUNTER — Encounter (INDEPENDENT_AMBULATORY_CARE_PROVIDER_SITE_OTHER): Payer: Self-pay | Admitting: Vascular Surgery

## 2023-08-15 ENCOUNTER — Ambulatory Visit (INDEPENDENT_AMBULATORY_CARE_PROVIDER_SITE_OTHER): Payer: 59 | Admitting: Vascular Surgery

## 2023-08-15 ENCOUNTER — Ambulatory Visit (INDEPENDENT_AMBULATORY_CARE_PROVIDER_SITE_OTHER): Payer: 59

## 2023-08-15 VITALS — BP 144/83 | HR 67 | Resp 16 | Wt 189.4 lb

## 2023-08-15 DIAGNOSIS — I6523 Occlusion and stenosis of bilateral carotid arteries: Secondary | ICD-10-CM

## 2023-08-15 DIAGNOSIS — I2510 Atherosclerotic heart disease of native coronary artery without angina pectoris: Secondary | ICD-10-CM | POA: Diagnosis not present

## 2023-08-15 DIAGNOSIS — I10 Essential (primary) hypertension: Secondary | ICD-10-CM

## 2023-08-15 NOTE — Progress Notes (Unsigned)
MRN : [id number]  Maria Warren is a 67 y.o. (October 24, 1956) female who presents with chief complaint of No chief complaint on file. Marland Kitchen  History of Present Illness: Patient returns today in follow up of her carotid disease.  She is doing well today without any new complaints.  No focal neurologic symptoms. Specifically, the patient denies amaurosis fugax, speech or swallowing difficulties, or arm or leg weakness or numbness.  Her duplex today shows stable velocities just into the 40 to 59% range on the right and a chronic left ICA occlusion.  Current Outpatient Medications  Medication Sig Dispense Refill   acetaminophen (TYLENOL) 650 MG CR tablet Take 1,300 mg by mouth every 8 (eight) hours as needed for pain.     albuterol (VENTOLIN HFA) 108 (90 Base) MCG/ACT inhaler Inhale 2 puffs into the lungs every 6 (six) hours as needed for wheezing or shortness of breath. 1 each 11   aspirin 325 MG tablet Take 325 mg by mouth daily.     calcitRIOL (ROCALTROL) 0.5 MCG capsule Take 2 capsules (1 mcg total) by mouth in the morning and at bedtime. 360 capsule 0   cetirizine (ZYRTEC) 10 MG tablet Take 1 tablet (10 mg total) by mouth daily. 30 tablet 11   Cholecalciferol (VITAMIN D3) 50 MCG (2000 UT) TABS Take 1 tablet (2,000 Units total) by mouth in the morning. 30 tablet 11   clonazePAM (KLONOPIN) 0.5 MG tablet TAKE 1/2 TABLET BY MOUTH TWICE A DAY AS NEEDED FOR MOMENTS OF HIGH ANXIETY 30 tablet 2   Cyanocobalamin (B-12) 2500 MCG TABS Take 2,500 mg by mouth in the morning.     isosorbide mononitrate (IMDUR) 30 MG 24 hr tablet TAKE 1 TABLET BY MOUTH DAILY 30 tablet 1   levothyroxine (SYNTHROID) 125 MCG tablet Take 1 tablet (125 mcg total) by mouth daily before breakfast. 30 tablet 10   lisinopril (ZESTRIL) 2.5 MG tablet Take 2.5 mg by mouth daily.     Melatonin 10 MG TABS Take 10 mg by mouth at bedtime.     metoprolol tartrate (LOPRESSOR) 25 MG tablet TAKE 1 TABLET BY MOUTH TWICE A DAY 180 tablet 1    metoprolol tartrate (LOPRESSOR) 25 MG tablet TAKE 1 TABLET BY MOUTH TWICE A DAY 60 tablet 0   mupirocin ointment (BACTROBAN) 2 % Apply 1 Application topically 2 (two) times daily. 22 g 0   pantoprazole (PROTONIX) 40 MG tablet Take 40 mg by mouth in the morning.     rosuvastatin (CRESTOR) 40 MG tablet TAKE 1 TABLET BY MOUTH AT BEDTIME FOR HIGH CHOLESTEROL 30 tablet 1   tiotropium (SPIRIVA) 18 MCG inhalation capsule Place 1 capsule (18 mcg total) into inhaler and inhale daily as needed (respiratory issues). 30 capsule 11   aspirin EC 325 MG tablet TAKE 1 TABLET BY MOUTH DAILY (STOP PLAVIX) (Patient not taking: Reported on 02/27/2023) 90 tablet 3   lisinopril (ZESTRIL) 2.5 MG tablet Take 1 tablet by mouth daily. (Patient not taking: Reported on 02/27/2023)     metoprolol tartrate (LOPRESSOR) 25 MG tablet Take 1 tablet (25 mg total) by mouth 2 (two) times daily for 15 days. (Patient not taking: Reported on 02/27/2023) 30 tablet 0   nitroGLYCERIN (NITROSTAT) 0.4 MG SL tablet Place 0.4 mg under the tongue every 5 (five) minutes x 3 doses as needed for chest pain. (Patient not taking: Reported on 02/27/2023)     No current facility-administered medications for this visit.    Past Medical History:  Problem Relation Age of Onset   Breast cancer Maternal Aunt    Heart failure Mother    Arthritis Mother    Heart attack Father      Allergies  Allergen Reactions   Topiramate Nausea Only and Anxiety   Lyrica [Pregabalin] Nausea Only   Zoloft [Sertraline Hcl] Nausea Only   Bupropion Nausea Only    Light headed     REVIEW OF SYSTEMS (Negative unless checked)   Constitutional: [] Weight loss  [] Fever  [] Chills Cardiac: [] Chest pain   [] Chest pressure   [x] Palpitations   [] Shortness of breath when laying flat   [] Shortness of breath at rest   [x] Shortness of breath with exertion. Vascular:  [] Pain in legs with walking   [] Pain in legs at rest   [] Pain in legs when laying flat   [] Claudication   [] Pain in feet when walking  [] Pain in feet at rest  [] Pain in feet when laying flat   [] History of DVT   [] Phlebitis   [] Swelling in legs   [] Varicose veins   [] Non-healing ulcers Pulmonary:   [] Uses home oxygen   [] Productive cough   [] Hemoptysis   [] Wheeze  [x] COPD   [] Asthma Neurologic:  [] Dizziness  [] Blackouts   [] Seizures   [] History of stroke   [] History of TIA  [] Aphasia   [] Temporary blindness   [] Dysphagia   [] Weakness or numbness in arms   [] Weakness or numbness in legs Musculoskeletal:  [x] Arthritis   [] Joint swelling   [x] Joint pain   [] Low back pain Hematologic:  [] Easy bruising  [] Easy bleeding   [] Hypercoagulable state   [] Anemic   [] Hepatitis Gastrointestinal:  [] Blood in stool   [] Vomiting blood  [x] Gastroesophageal reflux/heartburn   [] Abdominal pain Genitourinary:  [] Chronic kidney disease   [] Difficult urination  [] Frequent urination  [] Burning with urination   [] Hematuria Skin:  [] Rashes   [] Ulcers   [] Wounds Psychological:  [x] History of anxiety   [x]  History of major depression.  Physical Examination  BP (!) 144/83 (BP Location: Left Arm)   Pulse 67   Resp 16   Wt 189 lb 6.4 oz (85.9 kg)   BMI 33.55 kg/m  Gen:  WD/WN, NAD.  Appears older than stated age Head: [place]/AT, No temporalis wasting. Ear/Nose/Throat: Hearing grossly intact, nares w/o erythema or drainage Eyes: Conjunctiva clear. Sclera non-icteric Neck: Supple.  Trachea midline Pulmonary:  Good air movement, no use of accessory muscles.  Cardiac: RRR, no JVD Vascular:  Vessel Right Left  Radial Palpable Palpable               Musculoskeletal: M/S 5/5 throughout.  No deformity or atrophy.  Trace lower extremity edema. Neurologic: Sensation grossly intact in extremities.  Symmetrical.  Speech is fluent.  Psychiatric: Judgment intact, Mood & affect appropriate for pt's clinical situation. Dermatologic: No rashes or ulcers noted.  No cellulitis or open wounds.      Labs No results found for this or any previous visit (from the past 2160 hour(s)).  Radiology VAS US CAROTID  Result Date: 08/16/2023 Carotid Arterial Duplex Study Patient Name:  GRAINNE KNIGHTS  Date of Exam:   08/15/2023 Medical Rec #: [id number]      Accession #:    [id number] Date of Birth: [birth year]      Patient Gender: F Patient Age:   12 years Exam Location:  [facility] Procedure:      VAS US CAROTID Referring Phys: Festus Barren --------------------------------------------------------------------------------  Indications:       Carotid  MRN : [id number]  Maria Warren is a 67 y.o. (October 24, 1956) female who presents with chief complaint of No chief complaint on file. Marland Kitchen  History of Present Illness: Patient returns today in follow up of her carotid disease.  She is doing well today without any new complaints.  No focal neurologic symptoms. Specifically, the patient denies amaurosis fugax, speech or swallowing difficulties, or arm or leg weakness or numbness.  Her duplex today shows stable velocities just into the 40 to 59% range on the right and a chronic left ICA occlusion.  Current Outpatient Medications  Medication Sig Dispense Refill   acetaminophen (TYLENOL) 650 MG CR tablet Take 1,300 mg by mouth every 8 (eight) hours as needed for pain.     albuterol (VENTOLIN HFA) 108 (90 Base) MCG/ACT inhaler Inhale 2 puffs into the lungs every 6 (six) hours as needed for wheezing or shortness of breath. 1 each 11   aspirin 325 MG tablet Take 325 mg by mouth daily.     calcitRIOL (ROCALTROL) 0.5 MCG capsule Take 2 capsules (1 mcg total) by mouth in the morning and at bedtime. 360 capsule 0   cetirizine (ZYRTEC) 10 MG tablet Take 1 tablet (10 mg total) by mouth daily. 30 tablet 11   Cholecalciferol (VITAMIN D3) 50 MCG (2000 UT) TABS Take 1 tablet (2,000 Units total) by mouth in the morning. 30 tablet 11   clonazePAM (KLONOPIN) 0.5 MG tablet TAKE 1/2 TABLET BY MOUTH TWICE A DAY AS NEEDED FOR MOMENTS OF HIGH ANXIETY 30 tablet 2   Cyanocobalamin (B-12) 2500 MCG TABS Take 2,500 mg by mouth in the morning.     isosorbide mononitrate (IMDUR) 30 MG 24 hr tablet TAKE 1 TABLET BY MOUTH DAILY 30 tablet 1   levothyroxine (SYNTHROID) 125 MCG tablet Take 1 tablet (125 mcg total) by mouth daily before breakfast. 30 tablet 10   lisinopril (ZESTRIL) 2.5 MG tablet Take 2.5 mg by mouth daily.     Melatonin 10 MG TABS Take 10 mg by mouth at bedtime.     metoprolol tartrate (LOPRESSOR) 25 MG tablet TAKE 1 TABLET BY MOUTH TWICE A DAY 180 tablet 1    metoprolol tartrate (LOPRESSOR) 25 MG tablet TAKE 1 TABLET BY MOUTH TWICE A DAY 60 tablet 0   mupirocin ointment (BACTROBAN) 2 % Apply 1 Application topically 2 (two) times daily. 22 g 0   pantoprazole (PROTONIX) 40 MG tablet Take 40 mg by mouth in the morning.     rosuvastatin (CRESTOR) 40 MG tablet TAKE 1 TABLET BY MOUTH AT BEDTIME FOR HIGH CHOLESTEROL 30 tablet 1   tiotropium (SPIRIVA) 18 MCG inhalation capsule Place 1 capsule (18 mcg total) into inhaler and inhale daily as needed (respiratory issues). 30 capsule 11   aspirin EC 325 MG tablet TAKE 1 TABLET BY MOUTH DAILY (STOP PLAVIX) (Patient not taking: Reported on 02/27/2023) 90 tablet 3   lisinopril (ZESTRIL) 2.5 MG tablet Take 1 tablet by mouth daily. (Patient not taking: Reported on 02/27/2023)     metoprolol tartrate (LOPRESSOR) 25 MG tablet Take 1 tablet (25 mg total) by mouth 2 (two) times daily for 15 days. (Patient not taking: Reported on 02/27/2023) 30 tablet 0   nitroGLYCERIN (NITROSTAT) 0.4 MG SL tablet Place 0.4 mg under the tongue every 5 (five) minutes x 3 doses as needed for chest pain. (Patient not taking: Reported on 02/27/2023)     No current facility-administered medications for this visit.    Past Medical History:  MRN : [id number]  Maria Warren is a 67 y.o. (October 24, 1956) female who presents with chief complaint of No chief complaint on file. Marland Kitchen  History of Present Illness: Patient returns today in follow up of her carotid disease.  She is doing well today without any new complaints.  No focal neurologic symptoms. Specifically, the patient denies amaurosis fugax, speech or swallowing difficulties, or arm or leg weakness or numbness.  Her duplex today shows stable velocities just into the 40 to 59% range on the right and a chronic left ICA occlusion.  Current Outpatient Medications  Medication Sig Dispense Refill   acetaminophen (TYLENOL) 650 MG CR tablet Take 1,300 mg by mouth every 8 (eight) hours as needed for pain.     albuterol (VENTOLIN HFA) 108 (90 Base) MCG/ACT inhaler Inhale 2 puffs into the lungs every 6 (six) hours as needed for wheezing or shortness of breath. 1 each 11   aspirin 325 MG tablet Take 325 mg by mouth daily.     calcitRIOL (ROCALTROL) 0.5 MCG capsule Take 2 capsules (1 mcg total) by mouth in the morning and at bedtime. 360 capsule 0   cetirizine (ZYRTEC) 10 MG tablet Take 1 tablet (10 mg total) by mouth daily. 30 tablet 11   Cholecalciferol (VITAMIN D3) 50 MCG (2000 UT) TABS Take 1 tablet (2,000 Units total) by mouth in the morning. 30 tablet 11   clonazePAM (KLONOPIN) 0.5 MG tablet TAKE 1/2 TABLET BY MOUTH TWICE A DAY AS NEEDED FOR MOMENTS OF HIGH ANXIETY 30 tablet 2   Cyanocobalamin (B-12) 2500 MCG TABS Take 2,500 mg by mouth in the morning.     isosorbide mononitrate (IMDUR) 30 MG 24 hr tablet TAKE 1 TABLET BY MOUTH DAILY 30 tablet 1   levothyroxine (SYNTHROID) 125 MCG tablet Take 1 tablet (125 mcg total) by mouth daily before breakfast. 30 tablet 10   lisinopril (ZESTRIL) 2.5 MG tablet Take 2.5 mg by mouth daily.     Melatonin 10 MG TABS Take 10 mg by mouth at bedtime.     metoprolol tartrate (LOPRESSOR) 25 MG tablet TAKE 1 TABLET BY MOUTH TWICE A DAY 180 tablet 1    metoprolol tartrate (LOPRESSOR) 25 MG tablet TAKE 1 TABLET BY MOUTH TWICE A DAY 60 tablet 0   mupirocin ointment (BACTROBAN) 2 % Apply 1 Application topically 2 (two) times daily. 22 g 0   pantoprazole (PROTONIX) 40 MG tablet Take 40 mg by mouth in the morning.     rosuvastatin (CRESTOR) 40 MG tablet TAKE 1 TABLET BY MOUTH AT BEDTIME FOR HIGH CHOLESTEROL 30 tablet 1   tiotropium (SPIRIVA) 18 MCG inhalation capsule Place 1 capsule (18 mcg total) into inhaler and inhale daily as needed (respiratory issues). 30 capsule 11   aspirin EC 325 MG tablet TAKE 1 TABLET BY MOUTH DAILY (STOP PLAVIX) (Patient not taking: Reported on 02/27/2023) 90 tablet 3   lisinopril (ZESTRIL) 2.5 MG tablet Take 1 tablet by mouth daily. (Patient not taking: Reported on 02/27/2023)     metoprolol tartrate (LOPRESSOR) 25 MG tablet Take 1 tablet (25 mg total) by mouth 2 (two) times daily for 15 days. (Patient not taking: Reported on 02/27/2023) 30 tablet 0   nitroGLYCERIN (NITROSTAT) 0.4 MG SL tablet Place 0.4 mg under the tongue every 5 (five) minutes x 3 doses as needed for chest pain. (Patient not taking: Reported on 02/27/2023)     No current facility-administered medications for this visit.    Past Medical History:  MRN : [id number]  Maria Warren is a 67 y.o. (October 24, 1956) female who presents with chief complaint of No chief complaint on file. Marland Kitchen  History of Present Illness: Patient returns today in follow up of her carotid disease.  She is doing well today without any new complaints.  No focal neurologic symptoms. Specifically, the patient denies amaurosis fugax, speech or swallowing difficulties, or arm or leg weakness or numbness.  Her duplex today shows stable velocities just into the 40 to 59% range on the right and a chronic left ICA occlusion.  Current Outpatient Medications  Medication Sig Dispense Refill   acetaminophen (TYLENOL) 650 MG CR tablet Take 1,300 mg by mouth every 8 (eight) hours as needed for pain.     albuterol (VENTOLIN HFA) 108 (90 Base) MCG/ACT inhaler Inhale 2 puffs into the lungs every 6 (six) hours as needed for wheezing or shortness of breath. 1 each 11   aspirin 325 MG tablet Take 325 mg by mouth daily.     calcitRIOL (ROCALTROL) 0.5 MCG capsule Take 2 capsules (1 mcg total) by mouth in the morning and at bedtime. 360 capsule 0   cetirizine (ZYRTEC) 10 MG tablet Take 1 tablet (10 mg total) by mouth daily. 30 tablet 11   Cholecalciferol (VITAMIN D3) 50 MCG (2000 UT) TABS Take 1 tablet (2,000 Units total) by mouth in the morning. 30 tablet 11   clonazePAM (KLONOPIN) 0.5 MG tablet TAKE 1/2 TABLET BY MOUTH TWICE A DAY AS NEEDED FOR MOMENTS OF HIGH ANXIETY 30 tablet 2   Cyanocobalamin (B-12) 2500 MCG TABS Take 2,500 mg by mouth in the morning.     isosorbide mononitrate (IMDUR) 30 MG 24 hr tablet TAKE 1 TABLET BY MOUTH DAILY 30 tablet 1   levothyroxine (SYNTHROID) 125 MCG tablet Take 1 tablet (125 mcg total) by mouth daily before breakfast. 30 tablet 10   lisinopril (ZESTRIL) 2.5 MG tablet Take 2.5 mg by mouth daily.     Melatonin 10 MG TABS Take 10 mg by mouth at bedtime.     metoprolol tartrate (LOPRESSOR) 25 MG tablet TAKE 1 TABLET BY MOUTH TWICE A DAY 180 tablet 1    metoprolol tartrate (LOPRESSOR) 25 MG tablet TAKE 1 TABLET BY MOUTH TWICE A DAY 60 tablet 0   mupirocin ointment (BACTROBAN) 2 % Apply 1 Application topically 2 (two) times daily. 22 g 0   pantoprazole (PROTONIX) 40 MG tablet Take 40 mg by mouth in the morning.     rosuvastatin (CRESTOR) 40 MG tablet TAKE 1 TABLET BY MOUTH AT BEDTIME FOR HIGH CHOLESTEROL 30 tablet 1   tiotropium (SPIRIVA) 18 MCG inhalation capsule Place 1 capsule (18 mcg total) into inhaler and inhale daily as needed (respiratory issues). 30 capsule 11   aspirin EC 325 MG tablet TAKE 1 TABLET BY MOUTH DAILY (STOP PLAVIX) (Patient not taking: Reported on 02/27/2023) 90 tablet 3   lisinopril (ZESTRIL) 2.5 MG tablet Take 1 tablet by mouth daily. (Patient not taking: Reported on 02/27/2023)     metoprolol tartrate (LOPRESSOR) 25 MG tablet Take 1 tablet (25 mg total) by mouth 2 (two) times daily for 15 days. (Patient not taking: Reported on 02/27/2023) 30 tablet 0   nitroGLYCERIN (NITROSTAT) 0.4 MG SL tablet Place 0.4 mg under the tongue every 5 (five) minutes x 3 doses as needed for chest pain. (Patient not taking: Reported on 02/27/2023)     No current facility-administered medications for this visit.    Past Medical History:

## 2023-08-16 MED ORDER — METOPROLOL TARTRATE 25 MG PO TABS: 25.0000 mg | ORAL_TABLET | Freq: Two times a day (BID) | ORAL | 0 refills | Status: DC

## 2023-08-16 NOTE — Assessment & Plan Note (Signed)
Her duplex today shows stable velocities just into the 40 to 59% range on the right and a chronic left ICA occlusion. No role for intervention at this level.  Continue current medical regimen.  Recheck in 1 year.

## 2023-08-31 ENCOUNTER — Other Ambulatory Visit: Payer: Self-pay | Admitting: Family

## 2023-09-09 ENCOUNTER — Other Ambulatory Visit: Payer: Self-pay | Admitting: Internal Medicine

## 2023-09-09 ENCOUNTER — Other Ambulatory Visit: Payer: Self-pay | Admitting: Family

## 2023-10-09 ENCOUNTER — Other Ambulatory Visit: Payer: Self-pay | Admitting: Family

## 2023-11-16 ENCOUNTER — Other Ambulatory Visit: Payer: Self-pay | Admitting: Family

## 2023-11-29 ENCOUNTER — Other Ambulatory Visit: Payer: Self-pay | Admitting: Internal Medicine

## 2023-11-29 ENCOUNTER — Other Ambulatory Visit (INDEPENDENT_AMBULATORY_CARE_PROVIDER_SITE_OTHER): Payer: Self-pay | Admitting: Nurse Practitioner

## 2023-11-29 ENCOUNTER — Other Ambulatory Visit: Payer: Self-pay | Admitting: Family

## 2023-12-01 NOTE — Telephone Encounter (Signed)
Patient called and appointment made

## 2023-12-06 ENCOUNTER — Encounter: Payer: Self-pay | Admitting: Family

## 2023-12-06 ENCOUNTER — Ambulatory Visit (INDEPENDENT_AMBULATORY_CARE_PROVIDER_SITE_OTHER): Payer: 59 | Admitting: Family

## 2023-12-06 VITALS — BP 152/92 | HR 74 | Ht 63.0 in | Wt 190.8 lb

## 2023-12-06 DIAGNOSIS — E538 Deficiency of other specified B group vitamins: Secondary | ICD-10-CM

## 2023-12-06 DIAGNOSIS — E782 Mixed hyperlipidemia: Secondary | ICD-10-CM | POA: Diagnosis not present

## 2023-12-06 DIAGNOSIS — R7303 Prediabetes: Secondary | ICD-10-CM

## 2023-12-06 DIAGNOSIS — I10 Essential (primary) hypertension: Secondary | ICD-10-CM | POA: Diagnosis not present

## 2023-12-06 DIAGNOSIS — R5383 Other fatigue: Secondary | ICD-10-CM

## 2023-12-06 DIAGNOSIS — E559 Vitamin D deficiency, unspecified: Secondary | ICD-10-CM | POA: Diagnosis not present

## 2023-12-06 DIAGNOSIS — E039 Hypothyroidism, unspecified: Secondary | ICD-10-CM

## 2023-12-06 MED ORDER — LEVOTHYROXINE SODIUM 125 MCG PO TABS: 125.0000 ug | ORAL_TABLET | Freq: Every day | ORAL | 10 refills | Status: DC

## 2023-12-06 MED ORDER — LISINOPRIL 2.5 MG PO TABS: 2.5000 mg | ORAL_TABLET | Freq: Every day | ORAL | 1 refills | Status: DC

## 2023-12-06 MED ORDER — CALCITRIOL 0.5 MCG PO CAPS: 0.5000 ug | ORAL_CAPSULE | Freq: Every day | ORAL | 1 refills | Status: DC

## 2023-12-06 MED ORDER — PANTOPRAZOLE SODIUM 40 MG PO TBEC: 40.0000 mg | DELAYED_RELEASE_TABLET | Freq: Every morning | ORAL | 1 refills | Status: DC

## 2023-12-06 MED ORDER — ISOSORBIDE MONONITRATE ER 30 MG PO TB24: 30.0000 mg | ORAL_TABLET | Freq: Every day | ORAL | 1 refills | Status: DC

## 2023-12-06 MED ORDER — VITAMIN D3 50 MCG (2000 UT) PO TABS: 2000.0000 [IU] | ORAL_TABLET | Freq: Every morning | ORAL | 11 refills | Status: DC

## 2023-12-06 MED ORDER — METOPROLOL TARTRATE 25 MG PO TABS: 25.0000 mg | ORAL_TABLET | Freq: Two times a day (BID) | ORAL | 3 refills | Status: AC

## 2023-12-06 MED ORDER — CALCITRIOL 0.5 MCG PO CAPS: 1.0000 ug | ORAL_CAPSULE | Freq: Two times a day (BID) | ORAL | 1 refills | Status: DC

## 2023-12-06 MED ORDER — CLONAZEPAM 0.5 MG PO TABS: 0.2500 mg | ORAL_TABLET | Freq: Two times a day (BID) | ORAL | 1 refills | Status: DC | PRN

## 2023-12-06 MED ORDER — TIOTROPIUM BROMIDE MONOHYDRATE 18 MCG IN CAPS: 18.0000 ug | ORAL_CAPSULE | Freq: Every day | RESPIRATORY_TRACT | 11 refills | Status: AC | PRN

## 2023-12-06 MED ORDER — CETIRIZINE HCL 10 MG PO TABS: 10.0000 mg | ORAL_TABLET | Freq: Every day | ORAL | 11 refills | Status: DC

## 2023-12-07 LAB — CBC WITH DIFFERENTIAL/PLATELET
Basophils Absolute: 0.1 10*3/uL (ref 0.0–0.2)
Basos: 1 %
EOS (ABSOLUTE): 0.2 10*3/uL (ref 0.0–0.4)
Eos: 2 %
Hematocrit: 38.7 % (ref 34.0–46.6)
Hemoglobin: 13 g/dL (ref 11.1–15.9)
Immature Grans (Abs): 0 10*3/uL (ref 0.0–0.1)
Immature Granulocytes: 0 %
Lymphocytes Absolute: 2 10*3/uL (ref 0.7–3.1)
Lymphs: 21 %
MCH: 33.2 pg — ABNORMAL HIGH (ref 26.6–33.0)
MCHC: 33.6 g/dL (ref 31.5–35.7)
MCV: 99 fL — ABNORMAL HIGH (ref 79–97)
Monocytes Absolute: 0.7 10*3/uL (ref 0.1–0.9)
Monocytes: 8 %
Neutrophils Absolute: 6.2 10*3/uL (ref 1.4–7.0)
Neutrophils: 68 %
Platelets: 215 10*3/uL (ref 150–450)
RBC: 3.91 x10E6/uL (ref 3.77–5.28)
RDW: 11.7 % (ref 11.7–15.4)
WBC: 9.2 10*3/uL (ref 3.4–10.8)

## 2023-12-07 LAB — CMP14+EGFR
ALT: 15 [IU]/L (ref 0–32)
AST: 25 [IU]/L (ref 0–40)
Albumin: 4.3 g/dL (ref 3.9–4.9)
Alkaline Phosphatase: 47 [IU]/L (ref 44–121)
BUN/Creatinine Ratio: 13 (ref 12–28)
BUN: 10 mg/dL (ref 8–27)
Bilirubin Total: 0.3 mg/dL (ref 0.0–1.2)
CO2: 24 mmol/L (ref 20–29)
Calcium: 9 mg/dL (ref 8.7–10.3)
Chloride: 99 mmol/L (ref 96–106)
Creatinine, Ser: 0.76 mg/dL (ref 0.57–1.00)
Globulin, Total: 2 g/dL (ref 1.5–4.5)
Glucose: 85 mg/dL (ref 70–99)
Potassium: 4.5 mmol/L (ref 3.5–5.2)
Sodium: 140 mmol/L (ref 134–144)
Total Protein: 6.3 g/dL (ref 6.0–8.5)
eGFR: 85 mL/min/{1.73_m2} (ref >59)

## 2023-12-07 LAB — HEMOGLOBIN A1C
Est. average glucose Bld gHb Est-mCnc: 123 mg/dL
Hgb A1c MFr Bld: 5.9 % — ABNORMAL HIGH (ref 4.8–5.6)

## 2023-12-07 LAB — LIPID PANEL
Chol/HDL Ratio: 2.5 {ratio} (ref 0.0–4.4)
Cholesterol, Total: 139 mg/dL (ref 100–199)
HDL: 55 mg/dL (ref >39)
LDL Chol Calc (NIH): 64 mg/dL (ref 0–99)
Triglycerides: 112 mg/dL (ref 0–149)
VLDL Cholesterol Cal: 20 mg/dL (ref 5–40)

## 2023-12-07 LAB — VITAMIN D 25 HYDROXY (VIT D DEFICIENCY, FRACTURES): Vit D, 25-Hydroxy: 70.6 ng/mL (ref 30.0–100.0)

## 2023-12-07 LAB — TSH: TSH: 1.06 u[IU]/mL (ref 0.450–4.500)

## 2023-12-07 LAB — VITAMIN B12: Vitamin B-12: 1105 pg/mL (ref 232–1245)

## 2023-12-08 ENCOUNTER — Telehealth: Payer: Self-pay | Admitting: Family

## 2023-12-08 NOTE — Telephone Encounter (Signed)
 Patient left VM requesting a call back with her lab results. Please advise.

## 2023-12-10 ENCOUNTER — Encounter: Payer: Self-pay | Admitting: Family

## 2023-12-10 NOTE — Assessment & Plan Note (Signed)
 Patient has been out of BP meds, so will recheck BP at home once she has restarted meds.  She she will let me know if it continues to be high.

## 2023-12-10 NOTE — Assessment & Plan Note (Signed)
 Checking labs today.  Will continue supplements as needed.

## 2023-12-10 NOTE — Progress Notes (Signed)
 Established Patient Office Visit  Subjective:  Patient ID: Maria Warren, female    DOB: 09-Dec-1955  Age: 68 y.o. MRN: [id number]  Chief Complaint  Patient presents with   Follow-up    Med refills    Patient is here today for her 6 months follow up.  She has been feeling fairly well since last appointment.   She does not have additional concerns to discuss today.  Labs are due today. She needs refills.   I have reviewed her active problem list, medication list, allergies, health maintenance, notes from last encounter, lab results for her appointment today.      No other concerns at this time.   Past Medical History:  Diagnosis Date   Anemia    Anxiety    Arthritis    knees, lower back   Carotid stenosis    100% blockage left carotid and 70% blockage in right   COPD (chronic obstructive pulmonary disease) (HCC)    Coronary artery disease    4 blockages in heart   COVID-19 07/2020   Depression    Dyspnea    GERD (gastroesophageal reflux disease)    Headache    Pain over left eye, then vision in left eye goes black, quickly resolves.  Said Dr. Zorita office aware.   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; s/p XRT   Lower extremity edema    Thyroid  disease    Wears dentures    full upper    Past Surgical History:  Procedure Laterality Date   CARDIAC CATHETERIZATION     CATARACT EXTRACTION W/PHACO Right 03/07/2017   Procedure: CATARACT EXTRACTION PHACO AND INTRAOCULAR LENS PLACEMENT (IOC)  Right;  Surgeon: Myrna Adine Anes, MD;  Location: [facility];  Service: Ophthalmology;  Laterality: Right;   CATARACT EXTRACTION W/PHACO Left 04/11/2017   Procedure: CATARACT EXTRACTION PHACO AND INTRAOCULAR LENS PLACEMENT (IOC)  left;  Surgeon: Myrna Adine Anes, MD;  Location: [facility];  Service: Ophthalmology;  Laterality: Left;   CESAREAN SECTION     x 2    COLONOSCOPY N/A 11/24/2015   Procedure: COLONOSCOPY;  Surgeon: Gladis RAYMOND Mariner, MD;  Location: [facility];  Service: Endoscopy;  Laterality: N/A;   ESOPHAGOGASTRODUODENOSCOPY (EGD) WITH PROPOFOL  N/A 11/24/2015   Procedure: ESOPHAGOGASTRODUODENOSCOPY (EGD) WITH PROPOFOL ;  Surgeon: Gladis RAYMOND Mariner, MD;  Location: [facility] ENDOSCOPY;  Service: Endoscopy;  Laterality: N/A;   EYE SURGERY     JOINT REPLACEMENT     LEFT HEART CATH AND CORONARY ANGIOGRAPHY Left 01/11/2017   Procedure: Left Heart Cath and Coronary Angiography;  Surgeon: Wolm JINNY Rhyme, MD;  Location: [facility] INVASIVE CV LAB;  Service: Cardiovascular;  Laterality: Left;   MICROLARYNGOSCOPY Left 01/14/2020   Procedure: Suspension Microlaryngoscopy with biopsy;  Surgeon: Milissa Hamming, MD;  Location: [facility] ORS;  Service: ENT;  Laterality: Left;   REVERSE SHOULDER ARTHROPLASTY Left 04/26/2018   Procedure: REVERSE SHOULDER ARTHROPLASTY;  Surgeon: Edie Norleen JINNY, MD;  Location: [facility] ORS;  Service: Orthopedics;  Laterality: Left;   SIGMOIDOSCOPY N/A 02/19/2021   Procedure: SIGMOIDOSCOPY;  Surgeon: Dessa Reyes ORN, MD;  Location: [facility] ORS;  Service: General;  Laterality: N/A;   TOTAL KNEE ARTHROPLASTY Left 01/29/2018   Procedure: TOTAL KNEE ARTHROPLASTY;  Surgeon: Leora Lynwood SAUNDERS, MD;  Location: [facility] ORS;  Service: Orthopedics;  Laterality: Left;   TOTAL THYROIDECTOMY     TUBAL LIGATION  Social History   Socioeconomic History   Marital status: Widowed    Spouse name: Not on file   Number of children: Not on file   Years of education: Not on file   Highest education level: Not on file  Occupational History   Occupation: disabled   Tobacco Use   Smoking status: Every Day    Current packs/day: 0.50    Average packs/day: 0.5 packs/day for 25.0 years (12.5 ttl pk-yrs)    Types: Cigarettes   Smokeless tobacco: Never  Vaping Use   Vaping status: Former  Substance and Sexual Activity   Alcohol use: Yes    Alcohol/week: 14.0  standard drinks of alcohol    Types: 14 Cans of beer per week    Comment: 3-4 beers daily   Drug use: Not Currently    Types: Cocaine, Marijuana, Crack cocaine    Comment: drug use in 1980s-crack in 2018 but stopped   Sexual activity: Not on file  Other Topics Concern   Not on file  Social History Narrative   Not on file   Social Drivers of Health   Financial Resource Strain: Not on file  Food Insecurity: Not on file  Transportation Needs: Not on file  Physical Activity: Not on file  Stress: Not on file  Social Connections: Not on file  Intimate Partner Violence: Not on file    Family History  Problem Relation Age of Onset   Breast cancer Maternal Aunt    Heart failure Mother    Arthritis Mother    Heart attack Father     Allergies  Allergen Reactions   Topiramate Nausea Only and Anxiety   Lyrica [Pregabalin] Nausea Only   Zoloft [Sertraline Hcl] Nausea Only   Bupropion Nausea Only    Light headed     Review of Systems  All other systems reviewed and are negative.      Objective:   BP (!) 152/92   Pulse 74   Ht 5' 3 (1.6 m)   Wt 190 lb 12.8 oz (86.5 kg)   SpO2 94%   BMI 33.80 kg/m   Vitals:   12/06/23 1255  BP: (!) 152/92  Pulse: 74  Height: 5' 3 (1.6 m)  Weight: 190 lb 12.8 oz (86.5 kg)  SpO2: 94%  BMI (Calculated): 33.81    Physical Exam Vitals and nursing note reviewed.  Constitutional:      Appearance: Normal appearance. She is normal weight.  HENT:     Head: Normocephalic.  Eyes:     Extraocular Movements: Extraocular movements intact.     Conjunctiva/sclera: Conjunctivae normal.     Pupils: Pupils are equal, round, and reactive to light.  Cardiovascular:     Rate and Rhythm: Normal rate.  Pulmonary:     Effort: Pulmonary effort is normal.  Neurological:     General: No focal deficit present.     Mental Status: She is alert and oriented to person, place, and time. Mental status is at baseline.  Psychiatric:        Mood and  Affect: Mood normal.        Behavior: Behavior normal.        Thought Content: Thought content normal.      Results for orders placed or performed in visit on 12/06/23  Lipid panel  Result Value Ref Range   Cholesterol, Total 139 100 - 199 mg/dL   Triglycerides 887 0 - 149 mg/dL   HDL 55 >60 mg/dL   VLDL  Cholesterol Cal 20 5 - 40 mg/dL   LDL Chol Calc (NIH) 64 0 - 99 mg/dL   Chol/HDL Ratio 2.5 0.0 - 4.4 ratio  VITAMIN D  25 Hydroxy (Vit-D Deficiency, Fractures)  Result Value Ref Range   Vit D, 25-Hydroxy 70.6 30.0 - 100.0 ng/mL  CMP14+EGFR  Result Value Ref Range   Glucose 85 70 - 99 mg/dL   BUN 10 8 - 27 mg/dL   Creatinine, Ser 9.23 0.57 - 1.00 mg/dL   eGFR 85 >40 fO/fpw/8.26   BUN/Creatinine Ratio 13 12 - 28   Sodium 140 134 - 144 mmol/L   Potassium 4.5 3.5 - 5.2 mmol/L   Chloride 99 96 - 106 mmol/L   CO2 24 20 - 29 mmol/L   Calcium  9.0 8.7 - 10.3 mg/dL   Total Protein 6.3 6.0 - 8.5 g/dL   Albumin 4.3 3.9 - 4.9 g/dL   Globulin, Total 2.0 1.5 - 4.5 g/dL   Bilirubin Total 0.3 0.0 - 1.2 mg/dL   Alkaline Phosphatase 47 44 - 121 IU/L   AST 25 0 - 40 IU/L   ALT 15 0 - 32 IU/L  TSH  Result Value Ref Range   TSH 1.060 0.450 - 4.500 uIU/mL  Hemoglobin A1c  Result Value Ref Range   Hgb A1c MFr Bld 5.9 (H) 4.8 - 5.6 %   Est. average glucose Bld gHb Est-mCnc 123 mg/dL  Vitamin A87  Result Value Ref Range   Vitamin B-12 1,105 232 - 1,245 pg/mL  CBC with Diff  Result Value Ref Range   WBC 9.2 3.4 - 10.8 x10E3/uL   RBC 3.91 3.77 - 5.28 x10E6/uL   Hemoglobin 13.0 11.1 - 15.9 g/dL   Hematocrit 61.2 65.9 - 46.6 %   MCV 99 (H) 79 - 97 fL   MCH 33.2 (H) 26.6 - 33.0 pg   MCHC 33.6 31.5 - 35.7 g/dL   RDW 88.2 88.2 - 84.5 %   Platelets 215 150 - 450 x10E3/uL   Neutrophils 68 Not Estab. %   Lymphs 21 Not Estab. %   Monocytes 8 Not Estab. %   Eos 2 Not Estab. %   Basos 1 Not Estab. %   Neutrophils Absolute 6.2 1.4 - 7.0 x10E3/uL   Lymphocytes Absolute 2.0 0.7 - 3.1 x10E3/uL    Monocytes Absolute 0.7 0.1 - 0.9 x10E3/uL   EOS (ABSOLUTE) 0.2 0.0 - 0.4 x10E3/uL   Basophils Absolute 0.1 0.0 - 0.2 x10E3/uL   Immature Granulocytes 0 Not Estab. %   Immature Grans (Abs) 0.0 0.0 - 0.1 x10E3/uL    Recent Results (from the past 2160 hours)  Lipid panel     Status: None   Collection Time: 12/06/23  1:39 PM  Result Value Ref Range   Cholesterol, Total 139 100 - 199 mg/dL   Triglycerides 887 0 - 149 mg/dL   HDL 55 >60 mg/dL   VLDL Cholesterol Cal 20 5 - 40 mg/dL   LDL Chol Calc (NIH) 64 0 - 99 mg/dL   Chol/HDL Ratio 2.5 0.0 - 4.4 ratio    Comment:                                   T. Chol/HDL Ratio  Men  Women                               1/2 Avg.Risk  3.4    3.3                                   Avg.Risk  5.0    4.4                                2X Avg.Risk  9.6    7.1                                3X Avg.Risk 23.4   11.0   VITAMIN D  25 Hydroxy (Vit-D Deficiency, Fractures)     Status: None   Collection Time: 12/06/23  1:39 PM  Result Value Ref Range   Vit D, 25-Hydroxy 70.6 30.0 - 100.0 ng/mL    Comment: Vitamin D  deficiency has been defined by the Institute of Medicine and an Endocrine Society practice guideline as a level of serum 25-OH vitamin D  less than 20 ng/mL (1,2). The Endocrine Society went on to further define vitamin D  insufficiency as a level between 21 and 29 ng/mL (2). 1. IOM (Institute of Medicine). 2010. Dietary reference    intakes for calcium  and D. [place]: The    [employer]. 2. Holick MF, [place], Bischoff-Ferrari HA, et al.    Evaluation, treatment, and prevention of vitamin D     deficiency: an Endocrine Society clinical practice    guideline. JCEM. 2011 Jul; 96(7):1911-30.   CMP14+EGFR     Status: None   Collection Time: 12/06/23  1:39 PM  Result Value Ref Range   Glucose 85 70 - 99 mg/dL   BUN 10 8 - 27 mg/dL   Creatinine, Ser 9.23 0.57 - 1.00 mg/dL   eGFR 85  >40 fO/fpw/8.26   BUN/Creatinine Ratio 13 12 - 28   Sodium 140 134 - 144 mmol/L   Potassium 4.5 3.5 - 5.2 mmol/L   Chloride 99 96 - 106 mmol/L   CO2 24 20 - 29 mmol/L   Calcium  9.0 8.7 - 10.3 mg/dL   Total Protein 6.3 6.0 - 8.5 g/dL   Albumin 4.3 3.9 - 4.9 g/dL   Globulin, Total 2.0 1.5 - 4.5 g/dL   Bilirubin Total 0.3 0.0 - 1.2 mg/dL   Alkaline Phosphatase 47 44 - 121 IU/L   AST 25 0 - 40 IU/L   ALT 15 0 - 32 IU/L  TSH     Status: None   Collection Time: 12/06/23  1:39 PM  Result Value Ref Range   TSH 1.060 0.450 - 4.500 uIU/mL  Hemoglobin A1c     Status: Abnormal   Collection Time: 12/06/23  1:39 PM  Result Value Ref Range   Hgb A1c MFr Bld 5.9 (H) 4.8 - 5.6 %    Comment:          Prediabetes: 5.7 - 6.4          Diabetes: >6.4          Glycemic control for adults with diabetes: <7.0    Est. average glucose Bld gHb Est-mCnc 123 mg/dL  Vitamin B12     Status: None  Collection Time: 12/06/23  1:39 PM  Result Value Ref Range   Vitamin B-12 1,105 232 - 1,245 pg/mL  CBC with Diff     Status: Abnormal   Collection Time: 12/06/23  1:39 PM  Result Value Ref Range   WBC 9.2 3.4 - 10.8 x10E3/uL   RBC 3.91 3.77 - 5.28 x10E6/uL   Hemoglobin 13.0 11.1 - 15.9 g/dL   Hematocrit 61.2 65.9 - 46.6 %   MCV 99 (H) 79 - 97 fL   MCH 33.2 (H) 26.6 - 33.0 pg   MCHC 33.6 31.5 - 35.7 g/dL   RDW 88.2 88.2 - 84.5 %   Platelets 215 150 - 450 x10E3/uL   Neutrophils 68 Not Estab. %   Lymphs 21 Not Estab. %   Monocytes 8 Not Estab. %   Eos 2 Not Estab. %   Basos 1 Not Estab. %   Neutrophils Absolute 6.2 1.4 - 7.0 x10E3/uL   Lymphocytes Absolute 2.0 0.7 - 3.1 x10E3/uL   Monocytes Absolute 0.7 0.1 - 0.9 x10E3/uL   EOS (ABSOLUTE) 0.2 0.0 - 0.4 x10E3/uL   Basophils Absolute 0.1 0.0 - 0.2 x10E3/uL   Immature Granulocytes 0 Not Estab. %   Immature Grans (Abs) 0.0 0.0 - 0.1 x10E3/uL       Assessment & Plan:   Problem List Items Addressed This Visit       Cardiovascular and Mediastinum    Benign essential hypertension   Patient has been out of BP meds, so will recheck BP at home once she has restarted meds.  She she will let me know if it continues to be high.       Relevant Medications   isosorbide  mononitrate (IMDUR ) 30 MG 24 hr tablet   lisinopril  (ZESTRIL ) 2.5 MG tablet   metoprolol  tartrate (LOPRESSOR ) 25 MG tablet     Endocrine   Hypothyroidism (acquired)   Checking labs today.  Will continue supplements as needed.       Relevant Medications   metoprolol  tartrate (LOPRESSOR ) 25 MG tablet   levothyroxine  (SYNTHROID ) 125 MCG tablet   Other Relevant Orders   CMP14+EGFR (Completed)   TSH (Completed)   CBC with Diff (Completed)     Other   Mixed hyperlipidemia - Primary   Checking labs today.  Continue current therapy for lipid control. Will modify as needed based on labwork results.        Relevant Medications   isosorbide  mononitrate (IMDUR ) 30 MG 24 hr tablet   lisinopril  (ZESTRIL ) 2.5 MG tablet   metoprolol  tartrate (LOPRESSOR ) 25 MG tablet   Other Relevant Orders   Lipid panel (Completed)   CMP14+EGFR (Completed)   CBC with Diff (Completed)   B12 deficiency due to diet   Checking labs today.  Will continue supplements as needed.        Relevant Orders   CMP14+EGFR (Completed)   Vitamin B12 (Completed)   CBC with Diff (Completed)   Vitamin D  deficiency, unspecified   Checking labs today.  Will continue supplements as needed.        Relevant Orders   VITAMIN D  25 Hydroxy (Vit-D Deficiency, Fractures) (Completed)   CMP14+EGFR (Completed)   CBC with Diff (Completed)   Other Visit Diagnoses       Other fatigue       Relevant Orders   CMP14+EGFR (Completed)   CBC with Diff (Completed)     Prediabetes       A1C is in prediabetic ranges. Patient counseled on dietary choices  and verbalized understanding. Will reassess at follow up after next lab check.   Relevant Orders   CMP14+EGFR (Completed)   Hemoglobin A1c (Completed)   CBC  with Diff (Completed)       Return in about 6 months (around 06/04/2024).   Total time spent: 20 minutes  ALAN CHRISTELLA ARRANT, FNP  12/06/2023   This document may have been prepared by [facility] Voice Recognition software and as such may include unintentional dictation errors.

## 2023-12-10 NOTE — Assessment & Plan Note (Signed)
 Checking labs today.  Continue current therapy for lipid control. Will modify as needed based on labwork results.

## 2023-12-11 NOTE — Telephone Encounter (Signed)
 See results

## 2024-02-06 ENCOUNTER — Telehealth: Payer: Self-pay | Admitting: Family

## 2024-02-06 NOTE — Telephone Encounter (Signed)
 Patient left VM that she needed an updated FL2 form.    We just did this one month ago. Called patient to inform her but had to leave VM.

## 2024-02-09 ENCOUNTER — Ambulatory Visit: Admitting: Family

## 2024-02-13 ENCOUNTER — Other Ambulatory Visit: Payer: Self-pay | Admitting: Family

## 2024-03-27 ENCOUNTER — Other Ambulatory Visit: Payer: Self-pay | Admitting: Family

## 2024-04-09 ENCOUNTER — Other Ambulatory Visit: Payer: Self-pay | Admitting: Family

## 2024-06-04 ENCOUNTER — Ambulatory Visit: Payer: 59 | Admitting: Family

## 2024-06-12 ENCOUNTER — Ambulatory Visit: Admitting: Family

## 2024-06-14 ENCOUNTER — Other Ambulatory Visit: Payer: Self-pay | Admitting: Family

## 2024-06-20 ENCOUNTER — Other Ambulatory Visit: Payer: Self-pay | Admitting: Family

## 2024-07-09 ENCOUNTER — Ambulatory Visit: Admitting: Family

## 2024-07-17 DIAGNOSIS — I6523 Occlusion and stenosis of bilateral carotid arteries: Secondary | ICD-10-CM | POA: Diagnosis not present

## 2024-07-17 DIAGNOSIS — I10 Essential (primary) hypertension: Secondary | ICD-10-CM | POA: Diagnosis not present

## 2024-07-17 DIAGNOSIS — I2510 Atherosclerotic heart disease of native coronary artery without angina pectoris: Secondary | ICD-10-CM | POA: Diagnosis not present

## 2024-07-17 DIAGNOSIS — E782 Mixed hyperlipidemia: Secondary | ICD-10-CM | POA: Diagnosis not present

## 2024-07-22 ENCOUNTER — Ambulatory Visit: Admitting: Family

## 2024-07-27 ENCOUNTER — Other Ambulatory Visit: Payer: Self-pay | Admitting: Internal Medicine

## 2024-08-05 ENCOUNTER — Encounter: Payer: Self-pay | Admitting: Family

## 2024-08-05 ENCOUNTER — Ambulatory Visit (INDEPENDENT_AMBULATORY_CARE_PROVIDER_SITE_OTHER): Admitting: Family

## 2024-08-05 VITALS — BP 118/68 | HR 66 | Ht 63.0 in | Wt 173.8 lb

## 2024-08-05 DIAGNOSIS — L0390 Cellulitis, unspecified: Secondary | ICD-10-CM

## 2024-08-05 DIAGNOSIS — I10 Essential (primary) hypertension: Secondary | ICD-10-CM | POA: Diagnosis not present

## 2024-08-05 DIAGNOSIS — E039 Hypothyroidism, unspecified: Secondary | ICD-10-CM | POA: Diagnosis not present

## 2024-08-05 DIAGNOSIS — K219 Gastro-esophageal reflux disease without esophagitis: Secondary | ICD-10-CM

## 2024-08-05 DIAGNOSIS — F410 Panic disorder [episodic paroxysmal anxiety] without agoraphobia: Secondary | ICD-10-CM

## 2024-08-05 DIAGNOSIS — F332 Major depressive disorder, recurrent severe without psychotic features: Secondary | ICD-10-CM

## 2024-08-05 DIAGNOSIS — E782 Mixed hyperlipidemia: Secondary | ICD-10-CM

## 2024-08-05 DIAGNOSIS — E559 Vitamin D deficiency, unspecified: Secondary | ICD-10-CM

## 2024-08-05 DIAGNOSIS — Z1231 Encounter for screening mammogram for malignant neoplasm of breast: Secondary | ICD-10-CM

## 2024-08-05 MED ORDER — MUPIROCIN 2 % EX OINT: 1.0000 | TOPICAL_OINTMENT | Freq: Two times a day (BID) | CUTANEOUS | 1 refills | Status: AC

## 2024-08-05 NOTE — Progress Notes (Unsigned)
 Established Patient Office Visit  Subjective:  Patient ID: Maria Warren, female    DOB: January 06, 1956  Age: 68 y.o. MRN: [id number]  Chief Complaint  Patient presents with  . Follow-up    6 month follow up    HPI  No other concerns at this time.   Past Medical History:  Diagnosis Date  . Anemia   . Anxiety   . Arthritis    knees, lower back  . Carotid stenosis    100% blockage left carotid and 70% blockage in right  . COPD (chronic obstructive pulmonary disease) (HCC)   . Coronary artery disease    4 blockages in heart  . COVID-19 07/2020  . Depression   . Dyspnea   . GERD (gastroesophageal reflux disease)   . Headache    Pain over left eye, then vision in left eye goes black, quickly resolves.  Said Dr. Zorita office aware.  SABRA Heart murmur   . Hypertension   . Hypothyroidism   . 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 Lower extremity edema   . Thyroid  disease   . Wears dentures    full upper    Past Surgical History:  Procedure Laterality Date  . CARDIAC CATHETERIZATION    . CATARACT EXTRACTION W/PHACO Right 03/07/2017   Procedure: CATARACT EXTRACTION PHACO AND INTRAOCULAR LENS PLACEMENT (IOC)  Right;  Surgeon: Myrna Adine Anes, MD;  Location: [facility];  Service: Ophthalmology;  Laterality: Right;  . CATARACT EXTRACTION W/PHACO Left 04/11/2017   Procedure: CATARACT EXTRACTION PHACO AND INTRAOCULAR LENS PLACEMENT (IOC)  left;  Surgeon: Myrna Adine Anes, MD;  Location: [facility];  Service: Ophthalmology;  Laterality: Left;  . CESAREAN SECTION     x 2  . COLONOSCOPY N/A 11/24/2015   Procedure: COLONOSCOPY;  Surgeon: Gladis RAYMOND Mariner, MD;  Location: [facility];  Service: Endoscopy;  Laterality: N/A;  . ESOPHAGOGASTRODUODENOSCOPY (EGD) WITH PROPOFOL  N/A 11/24/2015   Procedure: ESOPHAGOGASTRODUODENOSCOPY (EGD) WITH PROPOFOL ;  Surgeon: Gladis RAYMOND Mariner, MD;  Location: [facility] ENDOSCOPY;   Service: Endoscopy;  Laterality: N/A;  . EYE SURGERY    . JOINT REPLACEMENT    . LEFT HEART CATH AND CORONARY ANGIOGRAPHY Left 01/11/2017   Procedure: Left Heart Cath and Coronary Angiography;  Surgeon: Wolm JINNY Rhyme, MD;  Location: [facility] INVASIVE CV LAB;  Service: Cardiovascular;  Laterality: Left;  SABRA MICROLARYNGOSCOPY Left 01/14/2020   Procedure: Suspension Microlaryngoscopy with biopsy;  Surgeon: Milissa Hamming, MD;  Location: [facility] ORS;  Service: ENT;  Laterality: Left;  . REVERSE SHOULDER ARTHROPLASTY Left 04/26/2018   Procedure: REVERSE SHOULDER ARTHROPLASTY;  Surgeon: Edie Norleen JINNY, MD;  Location: [facility] ORS;  Service: Orthopedics;  Laterality: Left;  . SIGMOIDOSCOPY N/A 02/19/2021   Procedure: SIGMOIDOSCOPY;  Surgeon: Dessa Reyes ORN, MD;  Location: [facility] ORS;  Service: General;  Laterality: N/A;  . TOTAL KNEE ARTHROPLASTY Left 01/29/2018   Procedure: TOTAL KNEE ARTHROPLASTY;  Surgeon: Leora Lynwood SAUNDERS, MD;  Location: [facility] ORS;  Service: Orthopedics;  Laterality: Left;  . TOTAL THYROIDECTOMY    . TUBAL LIGATION      Social History   Socioeconomic History  . Marital status: Widowed    Spouse name: Not on file  . Number of children: Not on file  . Years of education: Not on file  . Highest education level: Not on file  Occupational History  . Occupation: disabled   Tobacco Use  . Smoking status: Every Day  Current packs/day: 0.50    Average packs/day: 0.5 packs/day for 25.0 years (12.5 ttl pk-yrs)    Types: Cigarettes  . Smokeless tobacco: Never  Vaping Use  . Vaping status: Former  Substance and Sexual Activity  . Alcohol use: Yes    Alcohol/week: 14.0 standard drinks of alcohol    Types: 14 Cans of beer per week    Comment: 3-4 beers daily  . Drug use: Not Currently    Types: Cocaine, Marijuana, Crack cocaine    Comment: drug use in 1980s-crack in 2018 but stopped  . Sexual activity: Not on file  Other Topics Concern  . Not on file  Social History Narrative  .  Not on file   Social Drivers of Health   Financial Resource Strain: Not on file  Food Insecurity: Not on file  Transportation Needs: Not on file  Physical Activity: Not on file  Stress: Not on file  Social Connections: Not on file  Intimate Partner Violence: Not on file    Family History  Problem Relation Age of Onset  . Breast cancer Maternal Aunt   . Heart failure Mother   . Arthritis Mother   . Heart attack Father     Allergies  Allergen Reactions  . Topiramate Nausea Only and Anxiety  . Lyrica [Pregabalin] Nausea Only  . Zoloft [Sertraline Hcl] Nausea Only  . Bupropion Nausea Only    Light headed     Review of Systems  All other systems reviewed and are negative.      Objective:   BP 118/68   Pulse 66   Ht 5' 3 (1.6 m)   Wt 173 lb 12.8 oz (78.8 kg)   SpO2 96%   BMI 30.79 kg/m   Vitals:   08/05/24 1344  BP: 118/68  Pulse: 66  Height: 5' 3 (1.6 m)  Weight: 173 lb 12.8 oz (78.8 kg)  SpO2: 96%  BMI (Calculated): 30.79    Physical Exam Vitals and nursing note reviewed.  Constitutional:      Appearance: Normal appearance. She is normal weight.  HENT:     Head: Normocephalic.  Eyes:     Extraocular Movements: Extraocular movements intact.     Conjunctiva/sclera: Conjunctivae normal.     Pupils: Pupils are equal, round, and reactive to light.  Cardiovascular:     Rate and Rhythm: Normal rate.  Pulmonary:     Effort: Pulmonary effort is normal.  Neurological:     General: No focal deficit present.     Mental Status: She is alert and oriented to person, place, and time. Mental status is at baseline.  Psychiatric:        Mood and Affect: Mood normal.        Behavior: Behavior normal.        Thought Content: Thought content normal.      No results found for any visits on 08/05/24.  No results found for this or any previous visit (from the past 2160 hours).     Assessment & Plan:   Assessment & Plan Screening mammogram for breast  cancer     Return in about 6 months (around 02/03/2025) for AWV.   Total time spent: {AMA time spent:29001} minutes  ALAN CHRISTELLA ARRANT, FNP  08/05/2024   This document may have been prepared by [facility] Voice Recognition software and as such may include unintentional dictation errors.

## 2024-08-07 NOTE — Assessment & Plan Note (Signed)
 Will continue supplements as needed.

## 2024-08-07 NOTE — Assessment & Plan Note (Signed)
 Patient stable.  Well controlled with current therapy.   Continue current meds.

## 2024-08-07 NOTE — Assessment & Plan Note (Signed)
 Continue current therapy for lipid control. Will modify as needed based on labwork results.

## 2024-08-07 NOTE — Assessment & Plan Note (Signed)
 Blood pressure well controlled with current medications.  Continue current therapy.  Will reassess at follow up.

## 2024-08-09 ENCOUNTER — Other Ambulatory Visit: Payer: Self-pay | Admitting: Family

## 2024-08-13 ENCOUNTER — Encounter (INDEPENDENT_AMBULATORY_CARE_PROVIDER_SITE_OTHER): Payer: 59

## 2024-08-13 ENCOUNTER — Ambulatory Visit (INDEPENDENT_AMBULATORY_CARE_PROVIDER_SITE_OTHER): Payer: 59 | Admitting: Vascular Surgery

## 2024-08-29 ENCOUNTER — Other Ambulatory Visit: Payer: Self-pay | Admitting: Family

## 2024-09-06 ENCOUNTER — Other Ambulatory Visit: Payer: Self-pay | Admitting: Family

## 2024-09-06 ENCOUNTER — Other Ambulatory Visit: Payer: Self-pay | Admitting: Cardiology

## 2024-09-10 ENCOUNTER — Encounter (INDEPENDENT_AMBULATORY_CARE_PROVIDER_SITE_OTHER)

## 2024-09-10 ENCOUNTER — Ambulatory Visit (INDEPENDENT_AMBULATORY_CARE_PROVIDER_SITE_OTHER): Admitting: Vascular Surgery

## 2024-10-04 ENCOUNTER — Other Ambulatory Visit: Payer: Self-pay | Admitting: Family

## 2024-10-21 ENCOUNTER — Other Ambulatory Visit (INDEPENDENT_AMBULATORY_CARE_PROVIDER_SITE_OTHER): Payer: Self-pay | Admitting: Vascular Surgery

## 2024-10-21 DIAGNOSIS — I6523 Occlusion and stenosis of bilateral carotid arteries: Secondary | ICD-10-CM

## 2024-10-22 ENCOUNTER — Encounter (INDEPENDENT_AMBULATORY_CARE_PROVIDER_SITE_OTHER): Payer: Self-pay | Admitting: Vascular Surgery

## 2024-10-22 ENCOUNTER — Ambulatory Visit (INDEPENDENT_AMBULATORY_CARE_PROVIDER_SITE_OTHER)

## 2024-10-22 ENCOUNTER — Ambulatory Visit (INDEPENDENT_AMBULATORY_CARE_PROVIDER_SITE_OTHER): Admitting: Vascular Surgery

## 2024-10-22 VITALS — BP 152/83 | HR 64 | Resp 18 | Wt 168.8 lb

## 2024-10-22 DIAGNOSIS — I10 Essential (primary) hypertension: Secondary | ICD-10-CM

## 2024-10-22 DIAGNOSIS — I2510 Atherosclerotic heart disease of native coronary artery without angina pectoris: Secondary | ICD-10-CM | POA: Diagnosis not present

## 2024-10-22 DIAGNOSIS — I6523 Occlusion and stenosis of bilateral carotid arteries: Secondary | ICD-10-CM | POA: Diagnosis not present

## 2024-10-22 NOTE — Progress Notes (Signed)
 "   MRN : [id number]  Maria Warren is a 68 y.o. (1956-07-18) female who presents with chief complaint of  Chief Complaint  Patient presents with   Follow-up    3yr carotid follow up  .  History of Present Illness:   Discussed the use of AI scribe software for clinical note transcription with the patient, who gave verbal consent to proceed.  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  She has been clinically stable for over a year with no cerebrovascular events and no symptoms of stroke or TIA, including facial droop, extremity weakness, or speech disturbance. She also denies chest pain, dyspnea, and syncope.  Recent carotid ultrasound shows chronic total occlusion of the left carotid artery and progression of right carotid artery stenosis to the low end of the 60-79% range from 40-60%. She is concerned about the risk of bilateral carotid occlusion but is reassured that her condition is under close surveillance.    Results Diagnostic Carotid artery ultrasound (08/2023): Progression of right carotid artery stenosis to low end of 60-79% range; left carotid artery chronically occluded  Current Outpatient Medications  Medication Sig Dispense Refill   acetaminophen  (TYLENOL ) 650 MG CR tablet Take 1,300 mg by mouth every 8 (eight) hours as needed for pain.     albuterol  (VENTOLIN  HFA) 108 (90 Base) MCG/ACT inhaler Inhale 2 puffs into the lungs every 6 (six) hours as needed for wheezing or shortness of breath. 1 each 11   aspirin  EC 81 MG tablet Take 81 mg by mouth daily.     calcitRIOL  (ROCALTROL ) 0.5 MCG capsule TAKE 2 CAPSULES BY MOUTH TWICE DAILY (MORNING AND BEDTIME) 360 capsule 1   cetirizine  (ZYRTEC ) 10 MG tablet Take 1 tablet (10 mg total) by mouth daily. 30 tablet 11   Cholecalciferol  (VITAMIN  D3) 50 MCG (2000 UT) TABS Take 1 tablet (2,000 Units total) by mouth in the morning. 30 tablet 11   clonazePAM  (KLONOPIN ) 0.5 MG tablet TAKE 1/2 TABLET BY MOUTH 2 TIMES DAILY AS NEEDED FOR ANXIETY 30 tablet 2   Cyanocobalamin  (B-12) 2500 MCG TABS Take 2,500 mg by mouth in the morning.     isosorbide  mononitrate (IMDUR ) 30 MG 24 hr tablet TAKE 1 TABLET BY MOUTH DAILY 90 tablet 1   levothyroxine  (SYNTHROID ) 125 MCG tablet TAKE 1 TABLET BY MOUTH DAILY BEFORE BREAKFAST 30 tablet 2   lisinopril  (ZESTRIL ) 2.5 MG tablet TAKE 1 TABLET BY MOUTH DAILY 90 tablet 0   Melatonin 10 MG TABS Take 10 mg by mouth at bedtime.     metoprolol  tartrate (LOPRESSOR ) 25 MG tablet Take 1 tablet (25 mg total) by mouth 2 (two) times daily. 180 tablet 3   mupirocin  ointment (BACTROBAN ) 2 % Apply 1 Application topically 2 (two) times daily. 22 g 1   nitroGLYCERIN  (NITROSTAT ) 0.4 MG SL tablet Place 0.4 mg under the tongue every 5 (five) minutes x 3 doses as needed for chest pain.     pantoprazole  (PROTONIX ) 40 MG tablet TAKE 1 TABLET BY MOUTH IN THE MORNING 90 tablet 0   rosuvastatin (CRESTOR) 40 MG tablet TAKE 1 TABLET BY MOUTH AT BEDTIME FOR HIGH CHOLESTEROL 90 tablet 0   tiotropium (SPIRIVA ) 18 MCG inhalation capsule Place 1 capsule (18 mcg total) into inhaler and inhale daily as needed (respiratory issues). 30 capsule 11  No current facility-administered medications for this visit.    Past Medical History:  Diagnosis Date   Anemia    Anxiety    Arthritis    knees, lower back   Carotid stenosis    100% blockage left carotid and 70% blockage in right   COPD (chronic obstructive pulmonary disease) (HCC)    Coronary artery disease    4 blockages in heart   COVID-19 07/2020   Depression    Dyspnea    GERD (gastroesophageal reflux disease)    Headache    Pain over left eye, then vision in left eye goes black, quickly resolves.  Said Dr. Zorita office aware.   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; s/p XRT   Lower extremity edema    Thyroid  disease    Wears dentures    full upper    Past Surgical History:  Procedure Laterality Date   CARDIAC CATHETERIZATION     CATARACT EXTRACTION W/PHACO Right 03/07/2017   Procedure: CATARACT EXTRACTION PHACO AND INTRAOCULAR LENS PLACEMENT (IOC)  Right;  Surgeon: Myrna Adine Anes, MD;  Location: [facility];  Service: Ophthalmology;  Laterality: Right;   CATARACT EXTRACTION W/PHACO Left 04/11/2017   Procedure: CATARACT EXTRACTION PHACO AND INTRAOCULAR LENS PLACEMENT (IOC)  left;  Surgeon: Myrna Adine Anes, MD;  Location: [facility];  Service: Ophthalmology;  Laterality: Left;   CESAREAN SECTION     x 2   COLONOSCOPY N/A 11/24/2015   Procedure: COLONOSCOPY;  Surgeon: Gladis RAYMOND Mariner, MD;  Location: [facility];  Service: Endoscopy;  Laterality: N/A;   ESOPHAGOGASTRODUODENOSCOPY (EGD) WITH PROPOFOL  N/A 11/24/2015   Procedure: ESOPHAGOGASTRODUODENOSCOPY (EGD) WITH PROPOFOL ;  Surgeon: Gladis RAYMOND Mariner, MD;  Location: [facility] END[facility];  Service: Endoscopy;  Laterality: N/A;   EYE SURGERY     JOINT REPLACEMENT     LEFT HEART CATH AND CORONARY ANGIOGRAPHY Left 01/11/2017   Procedure: Left Heart Cath and Coronary Angiography;  Surgeon: Wolm JINNY Rhyme, MD;  Location: [facility];  Service: Cardiovascular;  Laterality: Left;   MICROLARYNGOSCOPY Left 01/14/2020   Procedure: Suspension Microlaryngoscopy with biopsy;  Surgeon: Milissa Hamming, MD;  Location: [facility];  Service: ENT;  Laterality: Left;   REVERSE SHOULDER ARTHROPLASTY Left 04/26/2018   Procedure: REVERSE SHOULDER ARTHROPLASTY;  Surgeon: Edie Norleen JINNY, MD;  Location: [facility];  Service: Orthopedics;  Laterality: Left;   SIGMOIDOSCOPY N/A 02/19/2021   Procedure: SIGMOIDOSCOPY;  Surgeon: Dessa Reyes ORN, MD;  Location: [facility];  Service: General;  Laterality: N/A;   TOTAL KNEE  ARTHROPLASTY Left 01/29/2018   Procedure: TOTAL KNEE ARTHROPLASTY;  Surgeon: Leora Lynwood SAUNDERS, MD;  Location: [facility];  Service: Orthopedics;  Laterality: Left;   TOTAL THYROIDECTOMY     TUBAL LIGATION       Social History[1]    Family History  Problem Relation Age of Onset   Breast cancer Maternal Aunt    Heart failure Mother    Arthritis Mother    Heart attack Father      Allergies[2]   REVIEW OF SYSTEMS (Negative unless checked)   Constitutional: [] Weight loss  [] Fever  [] Chills Cardiac: [] Chest pain   [] Chest pressure   [x] Palpitations   [] Shortness of breath when laying flat   [] Shortness of breath at rest   [x] Shortness of breath with exertion. Vascular:  [] Pain in legs with walking   [] Pain in legs at  rest   [] Pain in legs when laying flat   [] Claudication   [] Pain in feet when walking  [] Pain in feet at rest  [] Pain in feet when laying flat   [] History of DVT   [] Phlebitis   [] Swelling in legs   [] Varicose veins   [] Non-healing ulcers Pulmonary:   [] Uses home oxygen   [] Productive cough   [] Hemoptysis   [] Wheeze  [x] COPD   [] Asthma Neurologic:  [] Dizziness  [] Blackouts   [] Seizures   [] History of stroke   [] History of TIA  [] Aphasia   [] Temporary blindness   [] Dysphagia   [] Weakness or numbness in arms   [] Weakness or numbness in legs Musculoskeletal:  [x] Arthritis   [] Joint swelling   [x] Joint pain   [] Low back pain Hematologic:  [] Easy bruising  [] Easy bleeding   [] Hypercoagulable state   [] Anemic  [] Hepatitis Gastrointestinal:  [] Blood in stool   [] Vomiting blood  [x] Gastroesophageal reflux/heartburn   [] Abdominal pain Genitourinary:  [] Chronic kidney disease   [] Difficult urination  [] Frequent urination  [] Burning with urination   [] Hematuria Skin:  [] Rashes   [] Ulcers   [] Wounds Psychological:  [x] History of anxiety   [x]  History of major depression.  Physical Examination  BP (!) 152/83 (BP Location: Left Arm)   Pulse 64   Resp 18   Wt 168 lb 12.8 oz (76.6 kg)    BMI 29.90 kg/m  Gen:  WD/WN, NAD Head: [place]/AT, No temporalis wasting. Ear/Nose/Throat: Hearing grossly intact, nares w/o erythema or drainage Eyes: Conjunctiva clear. Sclera non-icteric Neck: Supple.  Trachea midline. Bilateral carotid bruit. Pulmonary:  Good air movement, no use of accessory muscles.  Cardiac: RRR, no JVD Vascular:  Vessel Right Left  Radial Palpable Palpable               Musculoskeletal: M/S 5/5 throughout.  No deformity or atrophy. No edema. Neurologic: Sensation grossly intact in extremities.  Symmetrical.  Speech is fluent.  Psychiatric: Judgment intact, Mood & affect appropriate for pt's clinical situation. Dermatologic: No rashes or ulcers noted.  No cellulitis or open wounds.  Physical Exam     Labs No results found for this or any previous visit (from the past 2160 hours).  Radiology No results found.  Assessment/Plan  Assessment & Plan Bilateral carotid artery stenosis and occlusion Chronic left carotid artery occlusion and progression of right carotid artery stenosis to 60-79%. Asymptomatic without cerebrovascular events. Right carotid stenosis does not meet intervention criteria, but with progression will need to shorten follow up. Risk of bilateral occlusion is small, but not zero, monitored by surveillance. - Shortened follow-up to 6 months due to right carotid stenosis progression. - Ordered continued surveillance with carotid ultrasound. - Advised monitoring for stroke or transient ischemic attack symptoms. - Discussed intervention if right carotid stenosis exceeds 70% or symptoms develop.  Hypertension blood pressure control important in reducing the progression of atherosclerotic disease. On appropriate oral medications.     Coronary artery disease Continue cardiac and antihypertensive medications as already ordered and reviewed, no changes at this time. Continue statin as ordered and reviewed, no changes at this time Nitrates PRN  for chest pain    Selinda Gu, MD  10/22/2024 3:18 PM    This note was created with Dragon medical transcription system.  Any errors from dictation are purely unintentional     [1]  Social History Tobacco Use   Smoking status: Every Day    Current packs/day: 0.50    Average packs/day: 0.5 packs/day for 25.0 years (12.5  ttl pk-yrs)    Types: Cigarettes   Smokeless tobacco: Never  Vaping Use   Vaping status: Former  Substance Use Topics   Alcohol use: Yes    Alcohol/week: 14.0 standard drinks of alcohol    Types: 14 Cans of beer per week    Comment: 3-4 beers daily   Drug use: Not Currently    Types: Cocaine, Marijuana, Crack cocaine    Comment: drug use in 1980s-crack in 2018 but stopped  [2]  Allergies Allergen Reactions   Topiramate Nausea Only and Anxiety   Lyrica [Pregabalin] Nausea Only   Zoloft [Sertraline Hcl] Nausea Only   Bupropion Nausea Only    Light headed    "

## 2024-11-27 ENCOUNTER — Other Ambulatory Visit: Payer: Self-pay | Admitting: Family

## 2024-11-28 ENCOUNTER — Telehealth: Payer: Self-pay | Admitting: Family

## 2024-11-28 NOTE — Telephone Encounter (Signed)
 Pt called in regards to her clonazepam  to be filled, pt currently only has 1 left  Please advise

## 2024-11-29 ENCOUNTER — Other Ambulatory Visit: Payer: Self-pay | Admitting: Family

## 2025-02-03 ENCOUNTER — Ambulatory Visit: Admitting: Family

## 2025-04-22 ENCOUNTER — Encounter (INDEPENDENT_AMBULATORY_CARE_PROVIDER_SITE_OTHER)

## 2025-04-22 ENCOUNTER — Ambulatory Visit (INDEPENDENT_AMBULATORY_CARE_PROVIDER_SITE_OTHER): Admitting: Vascular Surgery
